# Patient Record
Sex: Male | Born: 1940 | Race: Black or African American | Hispanic: No | Marital: Married | Smoking: Former smoker
Health system: Southern US, Community
[De-identification: ages and names within clinical notes are randomized; demographics above are authoritative.]

## PROBLEM LIST (undated history)

## (undated) DIAGNOSIS — Z860100 Personal history of colon polyps, unspecified: Secondary | ICD-10-CM

## (undated) DIAGNOSIS — K5730 Diverticulosis of large intestine without perforation or abscess without bleeding: Secondary | ICD-10-CM

## (undated) DIAGNOSIS — Z7901 Long term (current) use of anticoagulants: Secondary | ICD-10-CM

## (undated) DIAGNOSIS — M1990 Unspecified osteoarthritis, unspecified site: Secondary | ICD-10-CM

## (undated) DIAGNOSIS — M722 Plantar fascial fibromatosis: Secondary | ICD-10-CM

## (undated) DIAGNOSIS — Z86718 Personal history of other venous thrombosis and embolism: Secondary | ICD-10-CM

## (undated) DIAGNOSIS — I2510 Atherosclerotic heart disease of native coronary artery without angina pectoris: Secondary | ICD-10-CM

## (undated) DIAGNOSIS — Z86711 Personal history of pulmonary embolism: Secondary | ICD-10-CM

## (undated) DIAGNOSIS — M5136 Other intervertebral disc degeneration, lumbar region: Secondary | ICD-10-CM

## (undated) DIAGNOSIS — I700 Atherosclerosis of aorta: Secondary | ICD-10-CM

## (undated) DIAGNOSIS — J479 Bronchiectasis, uncomplicated: Secondary | ICD-10-CM

## (undated) DIAGNOSIS — R7302 Impaired glucose tolerance (oral): Secondary | ICD-10-CM

## (undated) DIAGNOSIS — G4730 Sleep apnea, unspecified: Secondary | ICD-10-CM

## (undated) DIAGNOSIS — N400 Enlarged prostate without lower urinary tract symptoms: Secondary | ICD-10-CM

## (undated) DIAGNOSIS — F419 Anxiety disorder, unspecified: Secondary | ICD-10-CM

## (undated) DIAGNOSIS — C61 Malignant neoplasm of prostate: Secondary | ICD-10-CM

## (undated) DIAGNOSIS — E7800 Pure hypercholesterolemia, unspecified: Secondary | ICD-10-CM

## (undated) DIAGNOSIS — I10 Essential (primary) hypertension: Secondary | ICD-10-CM

## (undated) HISTORY — DX: Diverticulosis of large intestine without perforation or abscess without bleeding: K57.30

## (undated) HISTORY — DX: Other intervertebral disc degeneration, lumbar region: M51.36

## (undated) HISTORY — DX: Plantar fascial fibromatosis: M72.2

## (undated) HISTORY — DX: Malignant neoplasm of prostate: C61

## (undated) HISTORY — DX: Pure hypercholesterolemia, unspecified: E78.00

## (undated) HISTORY — DX: Essential (primary) hypertension: I10

## (undated) HISTORY — PX: UVULECTOMY: SHX2631

## (undated) HISTORY — DX: Sleep apnea, unspecified: G47.30

## (undated) HISTORY — DX: Personal history of colon polyps, unspecified: Z86.0100

## (undated) HISTORY — DX: Atherosclerosis of aorta: I70.0

## (undated) HISTORY — PX: INSERTION PROSTATE RADIATION SEED: SUR718

## (undated) HISTORY — DX: Benign prostatic hyperplasia without lower urinary tract symptoms: N40.0

## (undated) HISTORY — DX: Personal history of other venous thrombosis and embolism: Z86.718

## (undated) HISTORY — DX: Bronchiectasis, uncomplicated: J47.9

## (undated) HISTORY — DX: Long term (current) use of anticoagulants: Z79.01

## (undated) HISTORY — DX: Unspecified osteoarthritis, unspecified site: M19.90

## (undated) HISTORY — DX: Atherosclerotic heart disease of native coronary artery without angina pectoris: I25.10

## (undated) HISTORY — DX: Anxiety disorder, unspecified: F41.9

## (undated) HISTORY — DX: Personal history of colonic polyps: Z86.010

## (undated) HISTORY — DX: Impaired glucose tolerance (oral): R73.02

---

## 2005-01-17 ENCOUNTER — Ambulatory Visit: Payer: Self-pay | Admitting: Internal Medicine

## 2005-07-18 ENCOUNTER — Ambulatory Visit: Payer: Self-pay | Admitting: Internal Medicine

## 2006-07-08 ENCOUNTER — Ambulatory Visit: Payer: Self-pay | Admitting: Internal Medicine

## 2006-07-08 LAB — CONVERTED CEMR LAB
Albumin: 4 g/dL (ref 3.5–5.2)
Alkaline Phosphatase: 76 units/L (ref 39–117)
BUN: 6 mg/dL (ref 6–23)
Bacteria, UA: NEGATIVE
Bilirubin Urine: NEGATIVE
Cholesterol: 156 mg/dL (ref 0–200)
Eosinophils Relative: 3.2 % (ref 0.0–5.0)
GFR calc Af Amer: 96 mL/min
HCT: 53.1 % — ABNORMAL HIGH (ref 39.0–52.0)
Hemoglobin: 17 g/dL (ref 13.0–17.0)
Ketones, ur: NEGATIVE mg/dL
LDL Cholesterol: 105 mg/dL — ABNORMAL HIGH (ref 0–99)
MCV: 87.2 fL (ref 78.0–100.0)
Monocytes Absolute: 0.6 10*3/uL (ref 0.2–0.7)
Mucus, UA: NEGATIVE
Neutrophils Relative %: 40.6 % — ABNORMAL LOW (ref 43.0–77.0)
Nitrite: NEGATIVE
PSA: 3.95 ng/mL (ref 0.10–4.00)
Potassium: 4.1 meq/L (ref 3.5–5.1)
RBC: 6.09 M/uL — ABNORMAL HIGH (ref 4.22–5.81)
RDW: 12.3 % (ref 11.5–14.6)
Sodium: 140 meq/L (ref 135–145)
Specific Gravity, Urine: 1.015 (ref 1.000–1.03)
TSH: 0.88 microintl units/mL (ref 0.35–5.50)
Total CHOL/HDL Ratio: 5
Total Protein, Urine: NEGATIVE mg/dL
Total Protein: 7.5 g/dL (ref 6.0–8.3)
Urine Glucose: NEGATIVE mg/dL
WBC: 6.9 10*3/uL (ref 4.5–10.5)

## 2006-07-30 ENCOUNTER — Encounter: Payer: Self-pay | Admitting: Internal Medicine

## 2006-07-30 ENCOUNTER — Ambulatory Visit: Payer: Self-pay

## 2006-09-04 ENCOUNTER — Ambulatory Visit: Payer: Self-pay | Admitting: Internal Medicine

## 2006-09-17 ENCOUNTER — Ambulatory Visit: Payer: Self-pay | Admitting: Internal Medicine

## 2006-09-17 ENCOUNTER — Encounter: Payer: Self-pay | Admitting: Internal Medicine

## 2006-09-17 LAB — HM COLONOSCOPY

## 2006-11-20 ENCOUNTER — Encounter: Payer: Self-pay | Admitting: *Deleted

## 2006-11-20 DIAGNOSIS — Z87898 Personal history of other specified conditions: Secondary | ICD-10-CM | POA: Insufficient documentation

## 2006-11-20 DIAGNOSIS — D126 Benign neoplasm of colon, unspecified: Secondary | ICD-10-CM | POA: Insufficient documentation

## 2006-11-20 DIAGNOSIS — R609 Edema, unspecified: Secondary | ICD-10-CM | POA: Insufficient documentation

## 2006-11-20 DIAGNOSIS — I82409 Acute embolism and thrombosis of unspecified deep veins of unspecified lower extremity: Secondary | ICD-10-CM | POA: Insufficient documentation

## 2006-11-20 DIAGNOSIS — E7800 Pure hypercholesterolemia, unspecified: Secondary | ICD-10-CM

## 2007-10-21 ENCOUNTER — Ambulatory Visit: Payer: Self-pay | Admitting: Internal Medicine

## 2007-10-21 DIAGNOSIS — M79609 Pain in unspecified limb: Secondary | ICD-10-CM | POA: Insufficient documentation

## 2007-10-21 LAB — CONVERTED CEMR LAB
Basophils Absolute: 0 10*3/uL (ref 0.0–0.1)
Basophils Relative: 0.4 % (ref 0.0–3.0)
Eosinophils Absolute: 0.2 10*3/uL (ref 0.0–0.7)
Lymphocytes Relative: 45.1 % (ref 12.0–46.0)
MCHC: 33.1 g/dL (ref 30.0–36.0)
Neutrophils Relative %: 41.9 % — ABNORMAL LOW (ref 43.0–77.0)
Platelets: 258 10*3/uL (ref 150–400)
Prothrombin Time: 12.2 s (ref 10.9–13.3)
RBC: 5.88 M/uL — ABNORMAL HIGH (ref 4.22–5.81)
WBC: 7.8 10*3/uL (ref 4.5–10.5)

## 2007-10-22 ENCOUNTER — Encounter: Payer: Self-pay | Admitting: Internal Medicine

## 2007-10-22 ENCOUNTER — Ambulatory Visit: Payer: Self-pay

## 2007-10-22 DIAGNOSIS — K5730 Diverticulosis of large intestine without perforation or abscess without bleeding: Secondary | ICD-10-CM | POA: Insufficient documentation

## 2007-10-22 DIAGNOSIS — I10 Essential (primary) hypertension: Secondary | ICD-10-CM

## 2007-11-05 ENCOUNTER — Encounter: Payer: Self-pay | Admitting: Internal Medicine

## 2007-11-05 ENCOUNTER — Telehealth: Payer: Self-pay | Admitting: Internal Medicine

## 2007-11-05 ENCOUNTER — Ambulatory Visit: Payer: Self-pay | Admitting: Internal Medicine

## 2007-11-05 DIAGNOSIS — R300 Dysuria: Secondary | ICD-10-CM | POA: Insufficient documentation

## 2007-11-05 DIAGNOSIS — R5381 Other malaise: Secondary | ICD-10-CM | POA: Insufficient documentation

## 2007-11-05 DIAGNOSIS — R5383 Other fatigue: Secondary | ICD-10-CM

## 2007-11-06 ENCOUNTER — Encounter (INDEPENDENT_AMBULATORY_CARE_PROVIDER_SITE_OTHER): Payer: Self-pay | Admitting: *Deleted

## 2007-11-06 LAB — CONVERTED CEMR LAB
ALT: 25 units/L (ref 0–53)
Albumin: 3.7 g/dL (ref 3.5–5.2)
BUN: 11 mg/dL (ref 6–23)
Bilirubin, Direct: 0.1 mg/dL (ref 0.0–0.3)
CO2: 29 meq/L (ref 19–32)
Calcium: 8.9 mg/dL (ref 8.4–10.5)
Creatinine, Ser: 1 mg/dL (ref 0.4–1.5)
GFR calc Af Amer: 96 mL/min
Glucose, Bld: 116 mg/dL — ABNORMAL HIGH (ref 70–99)
HDL: 35.9 mg/dL — ABNORMAL LOW (ref >39.0)
PSA: 4.28 ng/mL — ABNORMAL HIGH (ref 0.10–4.00)
Sodium: 141 meq/L (ref 135–145)
TSH: 0.7 microintl units/mL (ref 0.35–5.50)
Total Protein: 6.9 g/dL (ref 6.0–8.3)
Triglycerides: 71 mg/dL (ref 0–149)
VLDL: 14 mg/dL (ref 0–40)

## 2007-11-11 ENCOUNTER — Telehealth (INDEPENDENT_AMBULATORY_CARE_PROVIDER_SITE_OTHER): Payer: Self-pay | Admitting: *Deleted

## 2007-11-18 ENCOUNTER — Encounter: Payer: Self-pay | Admitting: Internal Medicine

## 2007-12-18 ENCOUNTER — Encounter: Payer: Self-pay | Admitting: Internal Medicine

## 2007-12-24 ENCOUNTER — Encounter: Payer: Self-pay | Admitting: Internal Medicine

## 2008-01-07 ENCOUNTER — Ambulatory Visit: Admission: RE | Admit: 2008-01-07 | Discharge: 2008-04-06 | Payer: Self-pay | Admitting: Radiation Oncology

## 2008-01-08 ENCOUNTER — Encounter: Payer: Self-pay | Admitting: Internal Medicine

## 2008-02-12 ENCOUNTER — Ambulatory Visit (HOSPITAL_BASED_OUTPATIENT_CLINIC_OR_DEPARTMENT_OTHER): Admission: RE | Admit: 2008-02-12 | Discharge: 2008-02-12 | Payer: Self-pay | Admitting: Urology

## 2008-02-12 ENCOUNTER — Encounter: Payer: Self-pay | Admitting: Internal Medicine

## 2008-02-24 ENCOUNTER — Encounter: Payer: Self-pay | Admitting: Internal Medicine

## 2008-03-04 ENCOUNTER — Encounter: Payer: Self-pay | Admitting: Internal Medicine

## 2008-03-19 ENCOUNTER — Encounter: Payer: Self-pay | Admitting: Internal Medicine

## 2008-03-24 ENCOUNTER — Ambulatory Visit: Payer: Self-pay | Admitting: Internal Medicine

## 2008-03-24 DIAGNOSIS — Z8546 Personal history of malignant neoplasm of prostate: Secondary | ICD-10-CM

## 2008-03-24 DIAGNOSIS — M722 Plantar fascial fibromatosis: Secondary | ICD-10-CM | POA: Insufficient documentation

## 2008-03-24 DIAGNOSIS — K648 Other hemorrhoids: Secondary | ICD-10-CM | POA: Insufficient documentation

## 2008-05-04 ENCOUNTER — Telehealth: Payer: Self-pay | Admitting: Internal Medicine

## 2008-05-04 ENCOUNTER — Telehealth (INDEPENDENT_AMBULATORY_CARE_PROVIDER_SITE_OTHER): Payer: Self-pay | Admitting: *Deleted

## 2008-05-04 ENCOUNTER — Ambulatory Visit: Payer: Self-pay | Admitting: Internal Medicine

## 2008-05-04 DIAGNOSIS — E739 Lactose intolerance, unspecified: Secondary | ICD-10-CM | POA: Insufficient documentation

## 2008-05-04 DIAGNOSIS — J069 Acute upper respiratory infection, unspecified: Secondary | ICD-10-CM | POA: Insufficient documentation

## 2008-06-22 ENCOUNTER — Encounter: Payer: Self-pay | Admitting: Internal Medicine

## 2008-09-22 ENCOUNTER — Encounter: Payer: Self-pay | Admitting: Internal Medicine

## 2008-10-27 ENCOUNTER — Ambulatory Visit: Payer: Self-pay | Admitting: Internal Medicine

## 2008-10-28 ENCOUNTER — Ambulatory Visit: Payer: Self-pay

## 2008-10-28 ENCOUNTER — Encounter: Payer: Self-pay | Admitting: Internal Medicine

## 2008-11-03 ENCOUNTER — Ambulatory Visit: Payer: Self-pay | Admitting: Internal Medicine

## 2008-11-03 LAB — CONVERTED CEMR LAB
ALT: 31 units/L (ref 0–53)
AST: 26 units/L (ref 0–37)
Albumin: 3.9 g/dL (ref 3.5–5.2)
BUN: 15 mg/dL (ref 6–23)
Basophils Absolute: 0 10*3/uL (ref 0.0–0.1)
CO2: 30 meq/L (ref 19–32)
Chloride: 105 meq/L (ref 96–112)
Cholesterol: 156 mg/dL (ref 0–200)
Eosinophils Relative: 2.3 % (ref 0.0–5.0)
GFR calc non Af Amer: 85.37 mL/min (ref >60)
Glucose, Bld: 125 mg/dL — ABNORMAL HIGH (ref 70–99)
HCT: 51 % (ref 39.0–52.0)
Hemoglobin, Urine: NEGATIVE
Hemoglobin: 16.9 g/dL (ref 13.0–17.0)
Ketones, ur: NEGATIVE mg/dL
Leukocytes, UA: NEGATIVE
Lymphs Abs: 2.4 10*3/uL (ref 0.7–4.0)
MCV: 88.9 fL (ref 78.0–100.0)
Monocytes Absolute: 0.4 10*3/uL (ref 0.1–1.0)
Monocytes Relative: 6.9 % (ref 3.0–12.0)
Neutro Abs: 3.6 10*3/uL (ref 1.4–7.7)
PSA: 0.54 ng/mL (ref 0.10–4.00)
Platelets: 273 10*3/uL (ref 150.0–400.0)
Potassium: 4.4 meq/L (ref 3.5–5.1)
RDW: 12.5 % (ref 11.5–14.6)
Sodium: 140 meq/L (ref 135–145)
TSH: 0.74 microintl units/mL (ref 0.35–5.50)
Urine Glucose: NEGATIVE mg/dL
Urobilinogen, UA: 0.2 (ref 0.0–1.0)
VLDL: 13.8 mg/dL (ref 0.0–40.0)

## 2008-11-09 ENCOUNTER — Ambulatory Visit: Payer: Self-pay | Admitting: Internal Medicine

## 2008-11-24 ENCOUNTER — Encounter: Payer: Self-pay | Admitting: Internal Medicine

## 2008-12-20 ENCOUNTER — Encounter: Payer: Self-pay | Admitting: Internal Medicine

## 2009-01-17 ENCOUNTER — Ambulatory Visit: Payer: Self-pay | Admitting: Internal Medicine

## 2009-01-17 DIAGNOSIS — R0789 Other chest pain: Secondary | ICD-10-CM | POA: Insufficient documentation

## 2009-03-15 ENCOUNTER — Ambulatory Visit: Payer: Self-pay | Admitting: Internal Medicine

## 2009-03-31 ENCOUNTER — Encounter: Payer: Self-pay | Admitting: Internal Medicine

## 2009-05-17 ENCOUNTER — Encounter: Payer: Self-pay | Admitting: Internal Medicine

## 2009-06-22 ENCOUNTER — Encounter: Payer: Self-pay | Admitting: Internal Medicine

## 2009-10-27 ENCOUNTER — Ambulatory Visit: Payer: Self-pay | Admitting: Internal Medicine

## 2009-10-27 LAB — CONVERTED CEMR LAB
Albumin: 3.8 g/dL (ref 3.5–5.2)
Alkaline Phosphatase: 82 units/L (ref 39–117)
Basophils Absolute: 0 10*3/uL (ref 0.0–0.1)
Bilirubin, Direct: 0.2 mg/dL (ref 0.0–0.3)
Calcium: 9.9 mg/dL (ref 8.4–10.5)
Cholesterol: 165 mg/dL (ref 0–200)
Eosinophils Absolute: 0.2 10*3/uL (ref 0.0–0.7)
GFR calc non Af Amer: 76.99 mL/min (ref >60)
Glucose, Bld: 112 mg/dL — ABNORMAL HIGH (ref 70–99)
HCT: 49.9 % (ref 39.0–52.0)
Ketones, ur: NEGATIVE mg/dL
LDL Cholesterol: 105 mg/dL — ABNORMAL HIGH (ref 0–99)
Leukocytes, UA: NEGATIVE
Lymphs Abs: 2.7 10*3/uL (ref 0.7–4.0)
MCHC: 33.1 g/dL (ref 30.0–36.0)
MCV: 88.3 fL (ref 78.0–100.0)
Monocytes Absolute: 0.5 10*3/uL (ref 0.1–1.0)
Nitrite: NEGATIVE
Platelets: 283 10*3/uL (ref 150.0–400.0)
RDW: 13.7 % (ref 11.5–14.6)
Sodium: 139 meq/L (ref 135–145)
Specific Gravity, Urine: 1.02 (ref 1.000–1.030)
Total Bilirubin: 0.9 mg/dL (ref 0.3–1.2)
Triglycerides: 108 mg/dL (ref 0.0–149.0)
pH: 6.5 (ref 5.0–8.0)

## 2009-10-29 ENCOUNTER — Emergency Department (HOSPITAL_BASED_OUTPATIENT_CLINIC_OR_DEPARTMENT_OTHER): Admission: EM | Admit: 2009-10-29 | Discharge: 2009-10-29 | Payer: Self-pay | Admitting: Emergency Medicine

## 2009-10-29 ENCOUNTER — Ambulatory Visit: Payer: Self-pay | Admitting: Diagnostic Radiology

## 2009-11-01 ENCOUNTER — Ambulatory Visit: Payer: Self-pay | Admitting: Internal Medicine

## 2009-11-01 ENCOUNTER — Encounter: Payer: Self-pay | Admitting: Internal Medicine

## 2009-11-01 ENCOUNTER — Telehealth (INDEPENDENT_AMBULATORY_CARE_PROVIDER_SITE_OTHER): Payer: Self-pay | Admitting: *Deleted

## 2009-11-01 DIAGNOSIS — R252 Cramp and spasm: Secondary | ICD-10-CM | POA: Insufficient documentation

## 2009-11-01 DIAGNOSIS — F411 Generalized anxiety disorder: Secondary | ICD-10-CM | POA: Insufficient documentation

## 2009-11-07 ENCOUNTER — Encounter: Payer: Self-pay | Admitting: Internal Medicine

## 2009-11-08 ENCOUNTER — Ambulatory Visit: Payer: Self-pay

## 2009-11-08 ENCOUNTER — Encounter: Payer: Self-pay | Admitting: Internal Medicine

## 2009-11-24 ENCOUNTER — Telehealth: Payer: Self-pay | Admitting: Internal Medicine

## 2009-11-28 ENCOUNTER — Ambulatory Visit: Payer: Self-pay | Admitting: Internal Medicine

## 2009-11-28 DIAGNOSIS — M25579 Pain in unspecified ankle and joints of unspecified foot: Secondary | ICD-10-CM | POA: Insufficient documentation

## 2009-12-06 ENCOUNTER — Encounter: Payer: Self-pay | Admitting: Internal Medicine

## 2009-12-21 ENCOUNTER — Encounter: Payer: Self-pay | Admitting: Internal Medicine

## 2010-02-10 ENCOUNTER — Emergency Department (HOSPITAL_COMMUNITY)
Admission: EM | Admit: 2010-02-10 | Discharge: 2010-02-10 | Payer: Self-pay | Source: Home / Self Care | Admitting: Emergency Medicine

## 2010-02-13 ENCOUNTER — Encounter: Payer: Self-pay | Admitting: Internal Medicine

## 2010-02-13 ENCOUNTER — Ambulatory Visit: Payer: Self-pay | Admitting: Internal Medicine

## 2010-02-13 DIAGNOSIS — I959 Hypotension, unspecified: Secondary | ICD-10-CM

## 2010-02-13 LAB — CONVERTED CEMR LAB
Bilirubin Urine: NEGATIVE
Ketones, ur: NEGATIVE mg/dL
Total Protein, Urine: NEGATIVE mg/dL
Urine Glucose: 250 mg/dL
pH: 6 (ref 5.0–8.0)

## 2010-02-15 ENCOUNTER — Ambulatory Visit: Payer: Self-pay

## 2010-02-15 ENCOUNTER — Encounter: Payer: Self-pay | Admitting: Internal Medicine

## 2010-02-15 ENCOUNTER — Ambulatory Visit (HOSPITAL_COMMUNITY)
Admission: RE | Admit: 2010-02-15 | Discharge: 2010-02-15 | Payer: Self-pay | Source: Home / Self Care | Attending: Internal Medicine | Admitting: Internal Medicine

## 2010-02-16 ENCOUNTER — Telehealth: Payer: Self-pay | Admitting: Internal Medicine

## 2010-03-06 ENCOUNTER — Encounter: Payer: Self-pay | Admitting: Internal Medicine

## 2010-03-06 ENCOUNTER — Other Ambulatory Visit: Payer: Self-pay | Admitting: Internal Medicine

## 2010-03-06 ENCOUNTER — Ambulatory Visit
Admission: RE | Admit: 2010-03-06 | Discharge: 2010-03-06 | Payer: Self-pay | Source: Home / Self Care | Attending: Internal Medicine | Admitting: Internal Medicine

## 2010-03-06 DIAGNOSIS — Z86711 Personal history of pulmonary embolism: Secondary | ICD-10-CM | POA: Insufficient documentation

## 2010-03-06 LAB — BASIC METABOLIC PANEL
BUN: 12 mg/dL (ref 6–23)
CO2: 28 mEq/L (ref 19–32)
Calcium: 9.9 mg/dL (ref 8.4–10.5)
Chloride: 105 mEq/L (ref 96–112)
Creatinine, Ser: 0.9 mg/dL (ref 0.4–1.5)
GFR: 108.59 mL/min (ref >60.00)
Glucose, Bld: 69 mg/dL — ABNORMAL LOW (ref 70–99)
Potassium: 4.8 mEq/L (ref 3.5–5.1)
Sodium: 140 mEq/L (ref 135–145)

## 2010-03-09 ENCOUNTER — Telehealth: Payer: Self-pay | Admitting: Internal Medicine

## 2010-03-09 ENCOUNTER — Encounter: Payer: Self-pay | Admitting: Internal Medicine

## 2010-03-09 ENCOUNTER — Telehealth (INDEPENDENT_AMBULATORY_CARE_PROVIDER_SITE_OTHER): Payer: Self-pay | Admitting: *Deleted

## 2010-03-09 ENCOUNTER — Ambulatory Visit
Admission: RE | Admit: 2010-03-09 | Discharge: 2010-03-09 | Payer: Self-pay | Source: Home / Self Care | Attending: Internal Medicine | Admitting: Internal Medicine

## 2010-03-09 ENCOUNTER — Ambulatory Visit: Payer: Self-pay | Admitting: Cardiovascular Disease

## 2010-03-09 DIAGNOSIS — I2699 Other pulmonary embolism without acute cor pulmonale: Secondary | ICD-10-CM | POA: Insufficient documentation

## 2010-03-09 DIAGNOSIS — D6859 Other primary thrombophilia: Secondary | ICD-10-CM | POA: Insufficient documentation

## 2010-03-09 LAB — CONVERTED CEMR LAB

## 2010-03-10 ENCOUNTER — Encounter: Payer: Self-pay | Admitting: Internal Medicine

## 2010-03-13 ENCOUNTER — Ambulatory Visit: Admission: RE | Admit: 2010-03-13 | Discharge: 2010-03-13 | Payer: Self-pay | Source: Home / Self Care

## 2010-03-13 ENCOUNTER — Ambulatory Visit (HOSPITAL_COMMUNITY)
Admission: RE | Admit: 2010-03-13 | Discharge: 2010-03-13 | Payer: Self-pay | Source: Home / Self Care | Attending: Internal Medicine | Admitting: Internal Medicine

## 2010-03-13 ENCOUNTER — Encounter: Payer: Self-pay | Admitting: Internal Medicine

## 2010-03-13 LAB — CONVERTED CEMR LAB: POC INR: 1.4

## 2010-03-15 ENCOUNTER — Ambulatory Visit: Admission: RE | Admit: 2010-03-15 | Discharge: 2010-03-15 | Payer: Self-pay | Source: Home / Self Care

## 2010-03-15 ENCOUNTER — Encounter: Payer: Self-pay | Admitting: Internal Medicine

## 2010-03-16 ENCOUNTER — Ambulatory Visit: Admission: RE | Admit: 2010-03-16 | Discharge: 2010-03-16 | Payer: Self-pay | Source: Home / Self Care

## 2010-03-23 ENCOUNTER — Ambulatory Visit: Admission: RE | Admit: 2010-03-23 | Discharge: 2010-03-23 | Payer: Self-pay | Source: Home / Self Care

## 2010-03-28 NOTE — Consult Note (Signed)
Summary: [facility]  [facility]   Imported By: Sherian Rein 04/05/2009 09:17:32  _____________________________________________________________________  External Attachment:    Type:   Image     Comment:   External Document

## 2010-03-28 NOTE — Consult Note (Signed)
Summary: [facility]  [facility]   Imported By: Sherian Rein 12/09/2009 13:37:07  _____________________________________________________________________  External Attachment:    Type:   Image     Comment:   External Document

## 2010-03-28 NOTE — Assessment & Plan Note (Signed)
Summary: swollen red ankle/cd   Vital Signs:  Patient profile:   70 year old male Height:      69 inches Weight:      202.25 pounds BMI:     29.98 O2 Sat:      96 % on Room air Temp:     98.7 degrees F oral Pulse rate:   84 / minute BP sitting:   118 / 82  (left arm) Cuff size:   regular  Vitals Entered By: Zella Ball Ewing CMA Duncan Dull) (November 28, 2009 11:52 AM)  O2 Flow:  Room air CC: Right ankle swollen and hot, flu shot/RE, Back Pain   CC:  Right ankle swollen and hot, flu shot/RE, and Back Pain.  History of Present Illness: here for acute - c/o mild to mod pain, red, swelling and warmth starting the at the whole right ankle and seemed somewhat to go up the back of the leg to mid calf;  little to no foot sweling;  overall x 3 days, much improved today, though did limp some in the first day.  Similar to episode 2 yrs ago, mild, and did not seem med attention at that time.  Pt denies CP, worsening sob, doe, wheezing, orthopnea, pnd, worsening LE edema, palps, dizziness or syncope  Denies fever, truama, overuse, hx of ankle arthriits or gout in the past.  .Denies polydipsia, polyuria.  Due for flu shot today.  Did not try any OTC meds. Pt denies new neuro symptoms such as headache, facial or extremity weakness No recent wt loss, night sweats, loss of appetite or other constitutional symptoms    Problems Prior to Update: 1)  Ankle Pain, Right  (ICD-719.47) 2)  Anxiety  (ICD-300.00) 3)  Leg Pain, Right  (ICD-729.5) 4)  Leg Cramps  (ICD-729.82) 5)  Glucose Intolerance  (ICD-271.3) 6)  Chest Pain, Right  (ICD-786.50) 7)  Preventive Health Care  (ICD-V70.0) 8)  Leg Pain, Right  (ICD-729.5) 9)  Glucose Intolerance  (ICD-271.3) 10)  Uri  (ICD-465.9) 11)  Plantar Fasciitis  (ICD-728.71) 12)  Prostate Cancer, Hx of  (ICD-V10.46) 13)  Internal Hemorrhoids Without Mention Comp  (ICD-455.0) 14)  Dysuria  (ICD-788.1) 15)  Fatigue  (ICD-780.79) 16)  Diverticulosis, Colon  (ICD-562.10) 17)   Hypertension  (ICD-401.9) 18)  Leg Pain, Right  (ICD-729.5) 19)  Leg Edema  (ICD-782.3) 20)  Benign Prostatic Hypertrophy, Hx of  (ICD-V13.8) 21)  Hypercholesterolemia  (ICD-272.0) 22)  Colonic Polyps  (ICD-211.3) 23)  Dvt  (ICD-453.40)  Medications Prior to Update: 1)  Aspirin 81 Mg  Tbec (Aspirin) .... One By Mouth Every Day 2)  Pneumovax 23 25 Mcg/0.12ml Inj (Pneumococcal Vac Polyvalent) .... Use As Directed  Current Medications (verified): 1)  Aspirin 81 Mg  Tbec (Aspirin) .... One By Mouth Every Day 2)  Pneumovax 23 25 Mcg/0.31ml Inj (Pneumococcal Vac Polyvalent) .... Use As Directed 3)  Prednisone 10 Mg Tabs (Prednisone) .... 3po Qd For 3days, Then 2po Qd For 3days, Then 1po Qd For 3days, Then Stop 4)  Indomethacin 50 Mg Caps (Indomethacin) .Marland Kitchen.. 1po Three Times A Day As Needed Gout Attack  Allergies (verified): No Known Drug Allergies  Past History:  Past Medical History: Last updated: 11/01/2009 hx of dvt 2004 colon polyps, hx of BPH hypercholesterolemia Hypertension Diverticulosis, colon left foot DJD Prostate cancer, hx of plantar fasciitis glucose intolerance Anxiety  Past Surgical History: Last updated: 11/01/2009 Uvulectomy for snoring, in his 20's s/p prestate seeds for prostate  ca dec 2009  Social History: Last updated: 11/01/2009 Current Smoker Alcohol use-yes 2 children retired - [employer] Drug use-no Married  Risk Factors: Smoking Status: current (10/21/2007)  Review of Systems       all otherwise negative per pt -    Physical Exam  General:  alert and overweight-appearing.   Head:  normocephalic and atraumatic.   Eyes:  vision grossly intact, pupils equal, and pupils round.   Ears:  R ear normal and L ear normal.   Nose:  no external deformity and no nasal discharge.   Mouth:  no gingival abnormalities and pharynx pink and moist.   Neck:  supple and no masses.   Lungs:  normal respiratory effort and  normal breath sounds.   Heart:  normal rate and regular rhythm.   Msk:  mild right ankle effusion and warmth, no apprecialbe redness and only mild tender at this point;  adjacent leg and foot without red, swelling, tender, swelling and neg homans Extremities:  no edema, no erythema  Neurologic:  right foot o/w neurovasc intact   Impression & Recommendations:  Problem # 1:  ANKLE PAIN, RIGHT (ICD-719.47) with effusion much improved in short period of time - most c/w mild gout attack, similar to episode 2 yrs ago he only reported today; for pred pack now, and indocin as needed in the future;  declines uric acid labs today;  if recurs will need this, and consider allopurinol daily  Problem # 2:  HYPERTENSION (ICD-401.9)  BP today: 118/82 Prior BP: 122/80 (11/01/2009)  Labs Reviewed: K+: 5.9 (10/27/2009) Creat: : 1.2 (10/27/2009)   Chol: 165 (10/27/2009)   HDL: 38.70 (10/27/2009)   LDL: 105 (10/27/2009)   TG: 108.0 (10/27/2009) stable overall by hx and exam, ok to continue meds/tx as is - no meds needed at this time  Complete Medication List: 1)  Aspirin 81 Mg Tbec (Aspirin) .... One by mouth every day 2)  Pneumovax 23 25 Mcg/0.60ml Inj (Pneumococcal vac polyvalent) .... Use as directed 3)  Prednisone 10 Mg Tabs (Prednisone) .... 3po qd for 3days, then 2po qd for 3days, then 1po qd for 3days, then stop 4)  Indomethacin 50 Mg Caps (Indomethacin) .Marland Kitchen.. 1po three times a day as needed gout attack  Other Orders: Flu Vaccine 53yrs + MEDICARE PATIENTS (Z6109) Administration Flu vaccine - MCR (U0454)  Patient Instructions: 1)  Please take the prednisone now 2)  You can take the indomethacin in the future for any recurrent gout like attacks 3)  Continue all previous medications as before this visit 4)  Please schedule a follow-up appointment as needed. Prescriptions: INDOMETHACIN 50 MG CAPS (INDOMETHACIN) 1po three times a day as needed gout attack  #90 x 1   Entered and Authorized by:    Corwin Levins MD   Signed by:   Corwin Levins MD on 11/28/2009   Method used:   Print then Give to Patient   RxID:   [id number] PREDNISONE 10 MG TABS (PREDNISONE) 3po qd for 3days, then 2po qd for 3days, then 1po qd for 3days, then stop  #18 x 0   Entered and Authorized by:   Corwin Levins MD   Signed by:   Corwin Levins MD on 11/28/2009   Method used:   Print then Give to Patient   RxID:   [id number]      Flu Vaccine Consent Questions     Do you have a history of severe allergic reactions to this vaccine? no  Any prior history of allergic reactions to egg and/or gelatin? no    Do you have a sensitivity to the preservative Thimersol? no    Do you have a past history of Guillan-Barre Syndrome? no    Do you currently have an acute febrile illness? no    Have you ever had a severe reaction to latex? no    Vaccine information given and explained to patient? yes    Are you currently pregnant? no    Lot Number:[id number]   Exp Date:08/26/2010   Site Given  Left Deltoid IMlu

## 2010-03-28 NOTE — Assessment & Plan Note (Signed)
Summary: CPX-LB   Vital Signs:  Patient profile:   70 year old male Height:      69 inches Weight:      204 pounds BMI:     30.23 O2 Sat:      96 % on Room air Temp:     97.8 degrees F oral Pulse rate:   69 / minute BP sitting:   122 / 80  (left arm) Cuff size:   regular  Vitals Entered By: Zella Ball Ewing CMA Duncan Dull) (November 01, 2009 8:41 AM)  O2 Flow:  Room air  Preventive Care Screening     declines flu shit today  CC: Adult Physical/RE   CC:  Adult Physical/RE.  History of Present Illness: here after being seen in the ER last sat;  he thinks he got overheated and becomre presyncopal;  no frank syncope;  had ecg , monitor, VS and CXR done (but no lab work done as pt declined since he just had labs here and felt "fine" by the time labs were to be done);  echart showed cxr neg for acute.  Pt denies CP, worsening sob, doe, wheezing, orthopnea, pnd, worsening LE edema, palps, dizziness or syncope  Pt denies new neuro symptoms such as headache, facial or extremity weakness  denies polydipsia or polyuria.   Taking the ASA but no rx meds.  Gained 3 lbs since jan 2011, but trying to stay active with yard work.  Trying to follow a lower chol diet but has some episodes of non compliance.   Has some fatigue but no OSA symptoms   Has some cramps adn pain to the calves, osmetimes with walking.    Preventive Screening-Counseling & Management      Drug Use:  no.    Problems Prior to Update: 1)  Chest Pain, Right  (ICD-786.50) 2)  Preventive Health Care  (ICD-V70.0) 3)  Leg Pain, Right  (ICD-729.5) 4)  Glucose Intolerance  (ICD-271.3) 5)  Uri  (ICD-465.9) 6)  Plantar Fasciitis  (ICD-728.71) 7)  Prostate Cancer, Hx of  (ICD-V10.46) 8)  Internal Hemorrhoids Without Mention Comp  (ICD-455.0) 9)  Dysuria  (ICD-788.1) 10)  Fatigue  (ICD-780.79) 11)  Diverticulosis, Colon  (ICD-562.10) 12)  Hypertension  (ICD-401.9) 13)  Leg Pain, Right  (ICD-729.5) 14)  Leg Edema  (ICD-782.3) 15)   Benign Prostatic Hypertrophy, Hx of  (ICD-V13.8) 16)  Hypercholesterolemia  (ICD-272.0) 17)  Colonic Polyps  (ICD-211.3) 18)  Dvt  (ICD-453.40)  Medications Prior to Update: 1)  Aspirin 81 Mg  Tbec (Aspirin) .... One By Mouth Every Day 2)  Ceftin 250 Mg Tabs (Cefuroxime Axetil) .Marland Kitchen.. 1 By Mouth Two Times A Day 3)  Hydrocodone-Homatropine 5-1.5 Mg/67ml Syrp (Hydrocodone-Homatropine) .Marland Kitchen.. 1 Tsp By Mouth Q 6 Hrs As Needed Cough  Current Medications (verified): 1)  Aspirin 81 Mg  Tbec (Aspirin) .... One By Mouth Every Day  Allergies (verified): No Known Drug Allergies  Past History:  Family History: Last updated: 10/21/2007 mother with DVT  Social History: Last updated: 11/01/2009 Current Smoker Alcohol use-yes 2 children retired - [employer] Drug use-no Married  Risk Factors: Smoking Status: current (10/21/2007)  Past Medical History: hx of dvt 2004 colon polyps, hx of BPH hypercholesterolemia Hypertension Diverticulosis, colon left foot DJD Prostate cancer, hx of plantar fasciitis glucose intolerance Anxiety  Past Surgical History: Uvulectomy for snoring, in his 20's s/p prestate seeds for prostate  ca dec 2009  Family History: Reviewed history from 10/21/2007 and no  changes required. mother with DVT  Social History: Reviewed history from 10/21/2007 and no changes required. Current Smoker Alcohol use-yes 2 children retired - [employer] Drug use-no Married Drug Use:  no  Review of Systems  The patient denies anorexia, fever, weight loss, weight gain, vision loss, decreased hearing, hoarseness, chest pain, syncope, dyspnea on exertion, peripheral edema, prolonged cough, headaches, hemoptysis, abdominal pain, melena, hematochezia, severe indigestion/heartburn, hematuria, muscle weakness, suspicious skin lesions, transient blindness, difficulty walking, depression, unusual weight change,  abnormal bleeding, enlarged lymph nodes, and angioedema.         all otherwise negative per pt -  except is appealing for a service connected disabiloity through the [place] for PTSD - was denies, and now under appeal  Physical Exam  General:  alert and overweight-appearing.   Head:  normocephalic and atraumatic.   Eyes:  vision grossly intact, pupils equal, and pupils round.   Ears:  R ear normal and L ear normal.   Nose:  no external deformity and no nasal discharge.   Mouth:  no gingival abnormalities and pharynx pink and moist.   Neck:  supple and no masses.   Lungs:  normal respiratory effort and normal breath sounds.   Heart:  normal rate and regular rhythm.   Abdomen:  soft, non-tender, and normal bowel sounds.   Msk:  no joint tenderness and no joint swelling.   Extremities:  no edema, no erythema  Neurologic:  cranial nerves II-XII intact and strength normal in all extremities.   Skin:  color normal and no rashes.   Psych:  not depressed appearing and moderately anxious.     Impression & Recommendations:  Problem # 1:  Preventive Health Care (ICD-V70.0) Overall doing well, age appropriate education and counseling updated and referral for appropriate preventive services done unless declined, immunizations up to date or declined, diet counseling done if overweight, urged to quit smoking if smokes , most recent labs reviewed and current ordered if appropriate, ecg reviewed or declined (interpretation per ECG scanned in the EMR if done); information regarding Medicare Prevention requirements given if appropriate; speciality referrals updated as appropriate   Problem # 2:  LEG CRAMPS (ICD-729.82)  with pain ? cluadication  - for LE dopplers  Orders: Radiology Referral (Radiology)  Problem # 3:  HYPERTENSION (ICD-401.9)  BP today: 122/80 Prior BP: 120/70 (03/15/2009)  Labs Reviewed: K+: 5.9 (10/27/2009) Creat: : 1.2 (10/27/2009)   Chol: 165 (10/27/2009)   HDL: 38.70  (10/27/2009)   LDL: 105 (10/27/2009)   TG: 108.0 (10/27/2009) stable overall by hx and exam, ok to continue meds/tx as is  - no meds needed  Problem # 4:  HYPERCHOLESTEROLEMIA (ICD-272.0)  Labs Reviewed: SGOT: 23 (10/27/2009)   SGPT: 20 (10/27/2009)   HDL:38.70 (10/27/2009), 38.70 (11/03/2008)  LDL:105 (10/27/2009), 104 (11/03/2008)  Chol:165 (10/27/2009), 156 (11/03/2008)  Trig:108.0 (10/27/2009), 69.0 (11/03/2008) d/w pt - for now goal is LDL < 100 - for better diet  Complete Medication List: 1)  Aspirin 81 Mg Tbec (Aspirin) .... One by mouth every day  Patient Instructions: 1)  You will be contacted about the referral(s) to: Leg artery ultrasound 2)  Continue all previous medications as before this visit, including the aspirin 3)  Please schedule a follow-up appointment in 6 months with: 4)  Lipid Panel prior to visit, ICD-9: 272.0 5)  HbgA1C prior to visit, ICD-9: 790.2 6)  BMET :  790.2

## 2010-03-28 NOTE — Letter (Signed)
[facility]   Imported By: Sherian Rein 06/27/2009 12:26:03  _____________________________________________________________________  External Attachment:    Type:   Image     Comment:   External Document

## 2010-03-28 NOTE — Progress Notes (Signed)
Summary: Vaccination?  Phone Note From Pharmacy   Caller: [facility] Summary of Call: Pharmacy called stating pt is requesting Rx for pnemovax at pharmacy Initial call taken by: Margaret Pyle, CMA,  November 24, 2009 1:30 PM  Follow-up for Phone Call        ok - to robin to handle Follow-up by: Corwin Levins MD,  November 24, 2009 2:06 PM    New/Updated Medications: PNEUMOVAX 23 25 MCG/0.5ML INJ (PNEUMOCOCCAL VAC POLYVALENT) Use as directed Prescriptions: PNEUMOVAX 23 25 MCG/0.5ML INJ (PNEUMOCOCCAL VAC POLYVALENT) Use as directed  #1 x 0   Entered by:   Scharlene Gloss CMA (AAMA)   Authorized by:   Corwin Levins MD   Signed by:   Scharlene Gloss CMA (AAMA) on 11/24/2009   Method used:   Faxed to ...       [facility] (retail)       [phone]       [place], [place]  [address]       Ph: [phone]       Fax: [phone]   RxID:   [id number]

## 2010-03-28 NOTE — Miscellaneous (Signed)
Summary: Orders Update  Clinical Lists Changes  Orders: Added new Test order of Arterial Duplex Lower Extremity (Arterial Duplex Low) - Signed 

## 2010-03-28 NOTE — Consult Note (Signed)
Summary: [facility]  [facility]   Imported By: Lennie Odor 12/27/2009 10:54:56  _____________________________________________________________________  External Attachment:    Type:   Image     Comment:   External Document

## 2010-03-28 NOTE — Progress Notes (Signed)
----   Converted from flag ---- ---- 11/01/2009 10:07 AM, Edman Circle wrote: appt 9/13 @ 3:00  ---- 11/01/2009 9:38 AM, Dagoberto Reef wrote: Thanks  ---- 11/01/2009 9:33 AM, Corwin Levins MD wrote: The following orders have been entered for this patient and placed on Admin Hold:  Type:     Referral       Code:   Radiology Description:   Radiology Referral Order Date:   11/01/2009   Authorized By:   Corwin Levins MD Order #:   [id number] Clinical Notes:   Name of Test or Procedure: LE arterial dopplers   Of What:  Special Instructions ------------------------------

## 2010-03-28 NOTE — Assessment & Plan Note (Signed)
Summary: COUGH  STC   Vital Signs:  Patient profile:   70 year old male Height:      69 inches Weight:      201 pounds BMI:     29.79 O2 Sat:      97 % on Room air Temp:     98.3 degrees F oral Pulse rate:   68 / minute BP sitting:   120 / 70  (left arm) Cuff size:   regular  Vitals Entered ByZella Ball Ewing (March 15, 2009 3:58 PM)  O2 Flow:  Room air CC: cough,chest congestion./RE   CC:  cough and chest congestion./RE.  History of Present Illness: here with 3 wks chest congestion and cough, now worse in the past 3 days with fever, ST, malasie, prod cough greenish d/c, but no wheezing and Pt denies CP, sob, doe, wheezing, orthopnea, pnd, worsening LE edema, palps, dizziness or syncope .  Pt denies new neuro symptoms such as headache, facial or extremity weakness   Pt denies polydipsia, polyuria, or low sugar symptoms such as shakiness improved with eating.  Overall good compliance with meds, trying to follow low chol diet, wt stable, little excercise however   Problems Prior to Update: 1)  Bronchitis-acute  (ICD-466.0) 2)  Chest Pain, Right  (ICD-786.50) 3)  Preventive Health Care  (ICD-V70.0) 4)  Leg Pain, Right  (ICD-729.5) 5)  Glucose Intolerance  (ICD-271.3) 6)  Uri  (ICD-465.9) 7)  Plantar Fasciitis  (ICD-728.71) 8)  Prostate Cancer, Hx of  (ICD-V10.46) 9)  Internal Hemorrhoids Without Mention Comp  (ICD-455.0) 10)  Dysuria  (ICD-788.1) 11)  Fatigue  (ICD-780.79) 12)  Diverticulosis, Colon  (ICD-562.10) 13)  Hypertension  (ICD-401.9) 14)  Leg Pain, Right  (ICD-729.5) 15)  Leg Edema  (ICD-782.3) 16)  Benign Prostatic Hypertrophy, Hx of  (ICD-V13.8) 17)  Hypercholesterolemia  (ICD-272.0) 18)  Colonic Polyps  (ICD-211.3) 19)  Dvt  (ICD-453.40)  Medications Prior to Update: 1)  Aspirin 81 Mg  Tbec (Aspirin) .... One By Mouth Every Day  Current Medications (verified): 1)  Aspirin 81 Mg  Tbec (Aspirin) .... One By Mouth Every Day 2)  Ceftin 250 Mg Tabs (Cefuroxime  Axetil) .Marland Kitchen.. 1 By Mouth Two Times A Day 3)  Hydrocodone-Homatropine 5-1.5 Mg/46ml Syrp (Hydrocodone-Homatropine) .Marland Kitchen.. 1 Tsp By Mouth Q 6 Hrs As Needed Cough  Allergies (verified): No Known Drug Allergies  Past History:  Past Medical History: Last updated: 03/24/2008 hx of dvt 2004 colon polyps, hx of BPH hypercholesterolemia Hypertension Diverticulosis, colon left foot DJD Prostate cancer, hx of plantar fasciitis  Past Surgical History: Last updated: 10/21/2007 Uvulectomy for snoring, in his 20's  Social History: Last updated: 10/21/2007 Current Smoker Alcohol use-yes 2 children retired - [employer]  Risk Factors: Smoking Status: current (10/21/2007)  Review of Systems       all otherwise negative per pt -  Physical Exam  General:  alert and overweight-appearing.   Head:  normocephalic and atraumatic.   Eyes:  vision grossly intact, pupils equal, and pupils round.   Ears:  bilat tm's red, sinus nontender Nose:  no external deformity and no nasal discharge.   Mouth:  no gingival abnormalities and pharynx pink and moist.   Lungs:  normal respiratory effort and normal breath sounds.   Heart:  normal rate and regular rhythm.   Extremities:  no edema, no erythema    Impression & Recommendations:  Problem # 1:  BRONCHITIS-ACUTE (ICD-466.0)  cant r/o pna -  His updated medication list for this problem includes:    Ceftin 250 Mg Tabs (Cefuroxime axetil) .Marland Kitchen... 1 by mouth two times a day    Hydrocodone-homatropine 5-1.5 Mg/46ml Syrp (Hydrocodone-homatropine) .Marland Kitchen... 1 tsp by mouth q 6 hrs as needed cough  Orders: T-2 View CXR, Same Day (71020.5TC)  Problem # 2:  GLUCOSE INTOLERANCE (ICD-271.3) asympt - does not need OHA at this time, pt to followup for cbg > 200 or polys  Problem # 3:  HYPERTENSION (ICD-401.9)  BP today: 120/70 Prior BP: 118/72 (01/17/2009)  Labs Reviewed: K+: 4.4 (11/03/2008) Creat: : 1.1 (11/03/2008)    Chol: 156 (11/03/2008)   HDL: 38.70 (11/03/2008)   LDL: 104 (11/03/2008)   TG: 69.0 (11/03/2008) d/w pt - stable overall by hx and exam, ok to continue meds/tx as is  -  no need for med at this time  Complete Medication List: 1)  Aspirin 81 Mg Tbec (Aspirin) .... One by mouth every day 2)  Ceftin 250 Mg Tabs (Cefuroxime axetil) .Marland Kitchen.. 1 by mouth two times a day 3)  Hydrocodone-homatropine 5-1.5 Mg/47ml Syrp (Hydrocodone-homatropine) .Marland Kitchen.. 1 tsp by mouth q 6 hrs as needed cough  Patient Instructions: 1)  Please take all new medications as prescribed 2)  Continue all previous medications as before this visit  3)  Please go to Radiology in the basement level for your X-Ray today  4)  Please schedule a follow-up appointment as recommended in Sept 2011 with CPX labs Prescriptions: HYDROCODONE-HOMATROPINE 5-1.5 MG/5ML SYRP (HYDROCODONE-HOMATROPINE) 1 tsp by mouth q 6 hrs as needed cough  #6 oz x 1   Entered and Authorized by:   Corwin Levins MD   Signed by:   Corwin Levins MD on 03/15/2009   Method used:   Print then Give to Patient   RxID:   [id number] CEFTIN 250 MG TABS (CEFUROXIME AXETIL) 1 by mouth two times a day  #20 x 0   Entered and Authorized by:   Corwin Levins MD   Signed by:   Corwin Levins MD on 03/15/2009   Method used:   Print then Give to Patient   RxID:   [id number]

## 2010-03-28 NOTE — Consult Note (Signed)
[facility]: [facility]   Imported By: Sherian Rein 05/23/2009 13:47:45  _____________________________________________________________________  External Attachment:    Type:   Image     Comment:   External Document

## 2010-03-30 NOTE — Progress Notes (Signed)
Summary: UTI?  Phone Note Call from Patient Call back at [facility] Phone [phone]   Caller: Patient Summary of Call: Pt called stating that he would like to start Allopurinol for flares. Pt also states that although PT message stated no UTI he is still having significant burning with urination, please advise. [facility] Initial call taken by: Margaret Pyle, CMA,  February 16, 2010 3:44 PM  Follow-up for Phone Call        ok - will add to med and send per emr  should start only AFTER the prednisone rx is done, and the starting dose is the lower one;  if this does not work, we can consider increasing the allopurinol Follow-up by: Corwin Levins MD,  February 16, 2010 4:22 PM  Additional Follow-up for Phone Call Additional follow up Details #1::        Pt advised and has upcoming appt with Urologist Additional Follow-up by: Margaret Pyle, CMA,  February 16, 2010 4:28 PM    New/Updated Medications: ALLOPURINOL 100 MG TABS (ALLOPURINOL) 1po once daily Prescriptions: ALLOPURINOL 100 MG TABS (ALLOPURINOL) 1po once daily  #90 x 3   Entered and Authorized by:   Corwin Levins MD   Signed by:   Corwin Levins MD on 02/16/2010   Method used:   Electronically to        [facility]  [phone]* (retail)       [address]       [place], [place]  [address]       Ph: [phone] or [phone]       Fax: [phone]   RxID:   [id number]

## 2010-03-30 NOTE — Progress Notes (Signed)
Summary: ct report   Phone Note From Other Clinic   Caller: chest xray report Summary of Call: ct angio chest-positive for pulmunary embolis-no other acute abnormalities-copy to be faxed as well Initial call taken by: Glynda Jaeger,  March 09, 2010 10:28 AM

## 2010-03-30 NOTE — Medication Information (Signed)
Summary: rov/sp   Anticoagulant Therapy  Managed by: Weston Brass, PharmD Referring MD: Tito Dine MD: Ladona Ridgel MD, Sharlot Gowda Indication 1: Pulmonary Embolism Lab Used: [facility] [place] Site: [address] INR POC 2.2 INR RANGE 2.0-3.0  Dietary changes: no    Health status changes: no    Bleeding/hemorrhagic complications: no    Recent/future hospitalizations: no    Any changes in medication regimen? no    Recent/future dental: no  Any missed doses?: no       Is patient compliant with meds? yes       Allergies: No Known Drug Allergies  Anticoagulation Management History:      The patient is taking warfarin and comes in today for a routine follow up visit.  Positive risk factors for bleeding include an age of 71 years or older.  The bleeding index is 'intermediate risk'.  Positive CHADS2 values include History of HTN.  Negative CHADS2 values include Age > 71 years old.  His last INR was 1.0 RATIO.  Anticoagulation responsible provider: Ladona Ridgel MD, Sharlot Gowda.  INR POC: 2.2.  Cuvette Lot#: [id number].  Exp: 02/2011.    Anticoagulation Management Assessment/Plan:      The patient's current anticoagulation dose is Warfarin sodium 5 mg tabs: Take 1 tablet daily or as directed by Anticoagulation Clinic.  The target INR is 2.0-3.0.  The next INR is due 03/23/2010.  Anticoagulation instructions were given to patient.  Results were reviewed/authorized by Weston Brass, PharmD.  He was notified by Weston Brass PharmD.         Prior Anticoagulation Instructions: INR 1.4  Coumadin 5 mg tablets - Take 1.5 tablets daily.  Continue Lovenox injections until next office visit.  return to clinic on Thursday.   Current Anticoagulation Instructions: INR 2.2  Stop Lovenox injections.  Decrease Coumadin to 1 1/2 tablets every day except 1 tablet on Monday and Friday.  Recheck INR in 1 week.

## 2010-03-30 NOTE — Assessment & Plan Note (Signed)
Summary: NP6/HYPOTENSION      Allergies Added: NKDA  Visit Type:  Initial Consult  CC:  blood pressure dropping.  History of Present Illness: Patient is a 70 year old who was refered for evaluation of hypotension. The patient was recently in the ER.  The AM he was see he felt OK  Ate usual breakfast of oatmeal.  He was wating for his car to get serviced at about 12:30.  He began to feel weal.  Sat down  Got some H20.  Felt sweaty, warm, presyncopal. EMS called.  BP on EMS arrival was in upper 80s. Went to ER.  Blood work neg.  Sent home The patient had a couple other spells  ONe occurred in Sept while working in yard.  Aother came on at church.  No episodes of syncope.  No associated palpitatons.  NO CP.  Otherwise the patient is active.  Current Medications (verified): 1)  Aspirin 81 Mg  Tbec (Aspirin) .... One By Mouth Every Day 2)  Allopurinol 100 Mg Tabs (Allopurinol) .Marland Kitchen.. 1po Once Daily  Allergies (verified): No Known Drug Allergies  Past History:  Past medical, surgical, family and social histories (including risk factors) reviewed, and no changes noted (except as noted below).  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   colon polyps, hx of BPH hypercholesterolemia Hypertension Diverticulosis, colon left foot DJD Prostate cancer, hx of plantar fasciitis glucose intolerance Anxiety  Past Surgical History: Reviewed history from 11/01/2009 and no changes required. Uvulectomy for snoring, in his 20's s/p prestate seeds for prostate  ca dec 2009  Family History: Reviewed history from 10/21/2007 and no changes required. mother with DVT  Social History: Reviewed history from 11/01/2009 and no changes required. Current Smoker Alcohol use-yes 2 children retired - [employer] Drug use-no Married  Review of Systems       All systems reviewed.  Neg to the above problme except as noted above.  Vital Signs:  Patient profile:    70 year old male Height:      69 inches Weight:      206 pounds Pulse rate:   76 / minute Pulse (ortho):   85 / minute BP sitting:   144 / 90  (right arm) BP standing:   156 / 101  Vitals Entered By: Jacquelin Hawking, CMA (March 06, 2010 12:09 PM)  Serial Vital Signs/Assessments:  Time      Position  BP       Pulse  Resp  Temp     By           Lying LA  139/81   64                    Layne Benton, RN, BSN           Sitting   146/84   70                    Layne Benton, Charity fundraiser, BSN           Standing  156/101  85                    Layne Benton, Charity fundraiser, BSN  Comments: 3 minutes standing BP 198/131 Pulse 87  By: Layne Benton, RN, BSN    Physical Exam  Additional Exam:  Patinet is in NAD HEENT:  Normocephalic, atraumatic. EOMI, PERRLA.  Neck: JVP is normal. No thyromegaly. No  bruits.  Lungs: clear to auscultation. No rales no wheezes.  Heart: Regular rate and rhythm. Normal S1, S2. No S3.   No significant murmurs. PMI not displaced.  Abdomen:  Supple, nontender. Normal bowel sounds. No masses. No hepatomegaly.  Extremities:   Good distal pulses throughout.  Triv edemal R leg. Musculoskeletal :moving all extremities.  Neuro:   alert and oriented x3.    EKG  Procedure date:  03/06/2010  Findings:      NSR.  LVH  Impression & Recommendations:  Problem # 1:  UNSPECIFIED HYPOTENSION (ICD-458.9)  Patient without syncope.  Episodes sound vagal by history.  Of interest is his histry of remote DVT. (I don't think an etiolgy/preciptant was found).  Given this I would recomm CT to r/o PE.  If neg, consider echo to evaluate LV function. He is not orthostatic on exam now.  Actually hypertensive. (prob due to need to urinate) Continue activites as tolerated.  Drink ample fluids.  Problem # 2:  HYPERTENSION (ICD-401.9)  With presyncopal spell I am reluctant to treat for now.  will need to be followed.    Orders: EKG w/ Interpretation (93000) TLB-BMP (Basic Metabolic  Panel-BMET) (80048-METABOL)  Other Orders: CT Scan  (CT Scan)  Patient Instructions: 1)  Non-Cardiac CT scanning, (CAT scanning), is a noninvasive, special x-ray that produces cross-sectional images of the body using x-rays and a computer. CT scans help physicians diagnose and treat medical conditions. For some CT exams, a contrast material is used to enhance visibility in the area of the body being studied. CT scans provide greater clarity and reveal more details than regular x-ray exams.  CT ANGIO of the CHEST

## 2010-03-30 NOTE — Medication Information (Signed)
Summary: rov/sp   Anticoagulant Therapy  Managed by: Weston Brass, PharmD Referring MD: Tito Dine MD: Patty Sermons Indication 1: Pulmonary Embolism Lab Used: [facility] [place] Site: [address] INR POC 2.8 INR RANGE 2.0-3.0  Dietary changes: no    Health status changes: no    Bleeding/hemorrhagic complications: no    Recent/future hospitalizations: no    Any changes in medication regimen? no    Recent/future dental: no  Any missed doses?: no       Is patient compliant with meds? yes       Current Medications (verified): 1)  Aspirin 81 Mg  Tbec (Aspirin) .... One By Mouth Every Day 2)  Allopurinol 100 Mg Tabs (Allopurinol) .Marland Kitchen.. 1po Once Daily 3)  Warfarin Sodium 5 Mg Tabs (Warfarin Sodium) .... Take 1 Tablet Daily or As Directed By Anticoagulation Clinic  Allergies: No Known Drug Allergies  Anticoagulation Management History:      The patient is taking warfarin and comes in today for a routine follow up visit.  Positive risk factors for bleeding include an age of 71 years or older.  The bleeding index is 'intermediate risk'.  Positive CHADS2 values include History of HTN.  Negative CHADS2 values include Age > 43 years old.  His last INR was 1.0 RATIO.  Anticoagulation responsible provider: Sissy Goetzke.  INR POC: 2.8.  Cuvette Lot#: [id number].  Exp: 02/2011.    Anticoagulation Management Assessment/Plan:      The patient's current anticoagulation dose is Warfarin sodium 5 mg tabs: Take 1 tablet daily or as directed by Anticoagulation Clinic.  The target INR is 2.0-3.0.  The next INR is due 04/06/2010.  Anticoagulation instructions were given to patient.  Results were reviewed/authorized by Weston Brass, PharmD.  He was notified by Weston Brass PharmD.         Prior Anticoagulation Instructions: INR 2.2  Stop Lovenox injections.  Decrease Coumadin to 1 1/2 tablets every day except 1 tablet on Monday and Friday.  Recheck INR in 1 week.   Current  Anticoagulation Instructions: INR 2.8  Continue same dose of 1 1/2 tablets every day except 1 tablet on Monday and Friday.  Recheck INR in 2 weeks.

## 2010-03-30 NOTE — Assessment & Plan Note (Signed)
Summary: post [place]/er   Vital Signs:  Patient profile:   70 year old male Height:      69 inches Weight:      206.13 pounds BMI:     30.55 O2 Sat:      97 % on Room air Temp:     98 degrees F oral Pulse rate:   65 / minute BP sitting:   112 / 72  (left arm) Cuff size:   large  Vitals Entered By: Zella Ball Ewing CMA Duncan Dull) (February 13, 2010 9:33 AM)  O2 Flow:  Room air  CC: post Hospital/RE   CC:  post Hospital/RE.  History of Present Illness: here for several issues, was seen in the ER recently with another episode of unexplained general weakness and low BP, improved with tx, and referred here to consider further eval and/or referral;  fortunately pt has not had futher symptoms and since the last episode Pt denies CP, worsening sob, doe, wheezing, orthopnea, pnd, worsening LE edema, palps, dizziness or syncope  Pt denies new neuro symptoms such as headache, facial or extremity weakness  Pt denies polydipsia, polyuria  Overall good compliance with meds, trying to follow low chol diet, wt stable, little excercise however .  Has hx of DVT RLE with stable swelling for which he is still concerned,  also with pain and swelling to the right ankle for over a wk ,mild but persistnet and for which the pain and swelling might be worse with more ambulation activity, better with rest, not clear about nsaids.  Also mentions another concern of discomfort to end urination for the last several days for unclear reasons;  denies fever, back pain, n/v, hematuria, freq, urgcency or change in hesitancy/slow flow.    Problems Prior to Update: 1)  Dysuria  (ICD-788.1) 2)  Unspecified Hypotension  (ICD-458.9) 3)  Ankle Pain, Right  (ICD-719.47) 4)  Anxiety  (ICD-300.00) 5)  Leg Pain, Right  (ICD-729.5) 6)  Leg Cramps  (ICD-729.82) 7)  Glucose Intolerance  (ICD-271.3) 8)  Chest Pain, Right  (ICD-786.50) 9)  Preventive Health Care  (ICD-V70.0) 10)  Leg Pain, Right  (ICD-729.5) 11)  Glucose Intolerance   (ICD-271.3) 12)  Uri  (ICD-465.9) 13)  Plantar Fasciitis  (ICD-728.71) 14)  Prostate Cancer, Hx of  (ICD-V10.46) 15)  Internal Hemorrhoids Without Mention Comp  (ICD-455.0) 16)  Dysuria  (ICD-788.1) 17)  Fatigue  (ICD-780.79) 18)  Diverticulosis, Colon  (ICD-562.10) 19)  Hypertension  (ICD-401.9) 20)  Leg Pain, Right  (ICD-729.5) 21)  Leg Edema  (ICD-782.3) 22)  Benign Prostatic Hypertrophy, Hx of  (ICD-V13.8) 23)  Hypercholesterolemia  (ICD-272.0) 24)  Colonic Polyps  (ICD-211.3) 25)  Dvt  (ICD-453.40)  Medications Prior to Update: 1)  Aspirin 81 Mg  Tbec (Aspirin) .... One By Mouth Every Day 2)  Pneumovax 23 25 Mcg/0.70ml Inj (Pneumococcal Vac Polyvalent) .... Use As Directed 3)  Prednisone 10 Mg Tabs (Prednisone) .... 3po Qd For 3days, Then 2po Qd For 3days, Then 1po Qd For 3days, Then Stop 4)  Indomethacin 50 Mg Caps (Indomethacin) .Marland Kitchen.. 1po Three Times A Day As Needed Gout Attack  Current Medications (verified): 1)  Aspirin 81 Mg  Tbec (Aspirin) .... One By Mouth Every Day 2)  Prednisone 10 Mg Tabs (Prednisone) .... 3po Qd For 3days, Then 2po Qd For 3days, Then 1po Qd For 3days, Then Stop 3)  Indomethacin 50 Mg Caps (Indomethacin) .Marland Kitchen.. 1po Three Times A Day As Needed Gout Attack 4)  Allopurinol 100 Mg  Tabs (Allopurinol) .Marland Kitchen.. 1po Once Daily  Allergies (verified): No Known Drug Allergies  Past History:  Past Medical History: Last updated: 11/01/2009 hx of dvt 2004 colon polyps, hx of BPH hypercholesterolemia Hypertension Diverticulosis, colon left foot DJD Prostate cancer, hx of plantar fasciitis glucose intolerance Anxiety  Past Surgical History: Last updated: 11/01/2009 Uvulectomy for snoring, in his 20's s/p prestate seeds for prostate  ca dec 2009  Social History: Last updated: 11/01/2009 Current Smoker Alcohol use-yes 2 children retired - [employer] Drug use-no Married  Risk Factors: Smoking Status: current  (10/21/2007)  Review of Systems       all otherwise negative per pt -    Physical Exam  General:  alert and overweight-appearing.   Head:  normocephalic and atraumatic.   Eyes:  vision grossly intact, pupils equal, and pupils round.   Ears:  R ear normal and L ear normal.   Nose:  no external deformity and no nasal discharge.   Mouth:  no gingival abnormalities and pharynx pink and moist.   Neck:  supple and no masses.   Lungs:  normal respiratory effort and normal breath sounds.   Heart:  normal rate and regular rhythm.   Abdomen:  soft, non-tender, and normal bowel sounds.   Genitalia:   No scrotal masses or lesions. No penis lesions or urethral discharge. Msk:  mild right ankle effusion and warmth, no apprecialbe redness and only mild tender at this point;  adjacent leg and foot without red, swelling, tender,  and neg homans but does have some trace post phlebitic sweling to the leg as well Extremities:  no edema, no erythema  Neurologic:  right foot o/w neurovasc intact   Impression & Recommendations:  Problem # 1:  UNSPECIFIED HYPOTENSION (ICD-458.9)  recurrent, with weakness ;  will need echo and card referral  Orders: Echo Referral (Echo) Cardiology Referral (Cardiology)  Problem # 2:  ANKLE PAIN, RIGHT (ICD-719.47)  with warmth and sweling to right ankle, worse to stand in one spot, better to walk, overall mikld - suspect DJD  - will check film; but if neg would have to consider low grade acute gouty episode - ok for prednisone 10 pack;  consider allopurinol prophylaxis  Orders: T-Ankle Comp Right (73610TC)  Problem # 3:  LEG EDEMA (ICD-782.3) chronic rle post phlbetic - educated, reassured  Problem # 4:  HYPERCHOLESTEROLEMIA (ICD-272.0)  Labs Reviewed: SGOT: 23 (10/27/2009)   SGPT: 20 (10/27/2009)   HDL:38.70 (10/27/2009), 38.70 (11/03/2008)  LDL:105 (10/27/2009), 104 (11/03/2008)  Chol:165 (10/27/2009), 156 (11/03/2008)  Trig:108.0 (10/27/2009), 69.0  (11/03/2008) stable overall by hx and exam, ok to continue meds/tx as is - declines statin  Problem # 5:  DYSURIA (ICD-788.1)  ? clinical signficiance - for urine studies today  Orders: T-Culture, Urine (16109-60454) TLB-Udip w/ Micro (81001-URINE)  Complete Medication List: 1)  Aspirin 81 Mg Tbec (Aspirin) .... One by mouth every day 2)  Prednisone 10 Mg Tabs (Prednisone) .... 3po qd for 3days, then 2po qd for 3days, then 1po qd for 3days, then stop 3)  Indomethacin 50 Mg Caps (Indomethacin) .Marland Kitchen.. 1po three times a day as needed gout attack 4)  Allopurinol 100 Mg Tabs (Allopurinol) .Marland Kitchen.. 1po once daily  Patient Instructions: 1)  Please go to Radiology in the basement level for your X-Ray today  2)  Please go to the Lab in the basement for your urine tests today 3)  Please take all new medications as prescribed - the prednisone 4)  Continue all previous medications as before this visit  5)  You will be contacted about the referral(s) to: echocardiogram and cardiology 6)  please follow lower chol diet 7)  Please schedule a follow-up appointment in September 2012 for CPX with labs Prescriptions: PREDNISONE 10 MG TABS (PREDNISONE) 3po qd for 3days, then 2po qd for 3days, then 1po qd for 3days, then stop  #18 x 0   Entered and Authorized by:   Corwin Levins MD   Signed by:   Corwin Levins MD on 02/13/2010   Method used:   Electronically to        [facility]  [phone]* (retail)       [address]       [place], [place]  [address]       Ph: [phone] or [phone]       Fax: [phone]   RxID:   [id number]    Orders Added: 1)  T-Culture, Urine [[id number]] 2)  T-Ankle Comp Right [73610TC] 3)  TLB-Udip w/ Micro [81001-URINE] 4)  Echo Referral [Echo] 5)  Cardiology Referral [Cardiology] 6)  Est. Patient Level IV [29518]

## 2010-03-30 NOTE — Medication Information (Signed)
Summary: Coumadin Clinic  Anticoagulant Therapy  Managed by: Weston Brass, PharmD Referring MD: Tito Dine MD: Ladona Ridgel MD, Sharlot Gowda Indication 1: Pulmonary Embolism Lab Used: [facility] [place] Site: [address] INR RANGE 2.0-3.0          Comments: Pt with new PE on CT scan in office today.  Spoke with Dr. Tenny Craw.  Hypercoag panel, CBC and BMET ordered.  Will need to go to spectrum and draw.  Pt is aware and has lab orders.  He has had a DVT in the past (>5 yrs ago) and was managed in [place].  Discussed Lovenox and Coumadin instructions with pt and wife.  He was given Lovenox 100mg  syringe to inject after he had labwork drawn.   Allergies: No Known Drug Allergies  Anticoagulation Management History:      The patient is taking warfarin and comes in today for a routine follow up visit.  Positive risk factors for bleeding include an age of 88 years or older.  The bleeding index is 'intermediate risk'.  Positive CHADS2 values include History of HTN.  Negative CHADS2 values include Age > 47 years old.  His last INR was 1.0 RATIO.  Anticoagulation responsible provider: Ladona Ridgel MD, Sharlot Gowda.    Anticoagulation Management Assessment/Plan:      The patient's current anticoagulation dose is Warfarin sodium 5 mg tabs: Take 1 tablet daily or as directed by Anticoagulation Clinic.  The next INR is due 03/13/2010.  Results were reviewed/authorized by Weston Brass, PharmD.         Current Anticoagulation Instructions: 1. Start Lovenox (enoxaparin) 100mg  injection in abdomen every 12 hours 2. Start Coumadin 5mg  every day. 3. Recheck INR on 1/16.  Prescriptions: ENOXAPARIN SODIUM 100 MG/ML SOLN (ENOXAPARIN SODIUM) Inject 1 syringe every 12 hours as directed  #10 syringes x 1   Entered by:   Weston Brass PharmD   Authorized by:   Sherrill Raring, MD, [facility]   Signed by:   Weston Brass PharmD on 03/09/2010   Method used:   Electronically to        [facility]  [phone]*  (retail)       [address]       [place], [place]  [address]       Ph: [phone] or [phone]       Fax: [phone]   RxID:   [id number] WARFARIN SODIUM 5 MG TABS (WARFARIN SODIUM) Take 1 tablet daily or as directed by Anticoagulation Clinic  #45 x 1   Entered by:   Weston Brass PharmD   Authorized by:   Sherrill Raring, MD, [facility]   Signed by:   Weston Brass PharmD on 03/09/2010   Method used:   Electronically to        [facility]  [phone]* (retail)       [address]       [place], [place]  [address]       Ph: [phone] or [phone]       Fax: [phone]   RxID:   [id number]

## 2010-03-30 NOTE — Progress Notes (Signed)
Summary: pt request call   Phone Note Call from Patient Call back at [phone]   Caller: Patient Summary of Call: pt request call Initial call taken by: Judie Grieve,  March 09, 2010 2:15 PM  Follow-up for Phone Call        Spoke with pt.  Called pharmacy and Lovenox injection were going to be >$400 and pt could not afford.  I checked our supply here and had enough of the 100mg  syringes to suppy pt until Monday.  He is aware to come by the office this afternoon to pick up.  Follow-up by: Weston Brass PharmD,  March 09, 2010 3:36 PM

## 2010-03-30 NOTE — Medication Information (Signed)
Summary: new PE/sp   Anticoagulant Therapy  Managed by: Weston Brass, PharmD Referring MD: Tito Dine MD: Ladona Ridgel MD, Sharlot Gowda Indication 1: Pulmonary Embolism Lab Used: [facility] [place] Site: [address] INR RANGE 2.0-3.0          Comments: Pt with new PE on CT scan in office today.  Spoke with Dr. Tenny Craw.  Hypercoag panel, CBC and BMET ordered.  Will need to go to spectrum and draw.  Pt is aware and has lab orders.  He has had a DVT in the past (>5 yrs ago) and was managed in [place].  Discussed Lovenox and Coumadin instructions with pt and wife.  He was given Lovenox 100mg  syringe to inject after he had labwork drawn.   Allergies: No Known Drug Allergies  Anticoagulation Management History:      The patient comes in today for his initial visit for anticoagulation therapy.  Positive risk factors for bleeding include an age of 70 years or older.  The bleeding index is 'intermediate risk'.  Positive CHADS2 values include History of HTN.  Negative CHADS2 values include Age > 55 years old.  His last INR was 1.0 RATIO.  Anticoagulation responsible provider: Ladona Ridgel MD, Sharlot Gowda.    Anticoagulation Management Assessment/Plan:      The patient's current anticoagulation dose is Warfarin sodium 5 mg tabs: Take 1 tablet daily or as directed by Anticoagulation Clinic.  The next INR is due 03/13/2010.  Anticoagulation instructions were given to patient.  Results were reviewed/authorized by Weston Brass, PharmD.  He was notified by Weston Brass PharmD.         Current Anticoagulation Instructions: Start Lovenox 100mg  subcutaneously injection every 12 hours today Start Coumadin 5mg  daily today Recheck INR on 03/13/10

## 2010-03-30 NOTE — Medication Information (Signed)
Summary: rov/sp   Anticoagulant Therapy  Managed by: Weston Brass, PharmD Referring MD: Tito Dine MD: Daleen Squibb MD, Maisie Fus Indication 1: Pulmonary Embolism Lab Used: [facility] [place] Site: [address] INR POC 1.4 INR RANGE 2.0-3.0  Dietary changes: no    Health status changes: no    Bleeding/hemorrhagic complications: no    Recent/future hospitalizations: no    Any changes in medication regimen? no    Recent/future dental: no  Any missed doses?: no       Is patient compliant with meds? yes       Allergies: No Known Drug Allergies  Anticoagulation Management History:      The patient is taking warfarin and comes in today for a routine follow up visit.  Positive risk factors for bleeding include an age of 70 years or older.  The bleeding index is 'intermediate risk'.  Positive CHADS2 values include History of HTN.  Negative CHADS2 values include Age > 71 years old.  His last INR was 1.0 RATIO.  Anticoagulation responsible provider: Daleen Squibb MD, Maisie Fus.  INR POC: 1.4.  Cuvette Lot#: [id number].  Exp: 03/2011.    Anticoagulation Management Assessment/Plan:      The patient's current anticoagulation dose is Warfarin sodium 5 mg tabs: Take 1 tablet daily or as directed by Anticoagulation Clinic.  The target INR is 2.0-3.0.  The next INR is due 03/16/2010.  Anticoagulation instructions were given to patient.  Results were reviewed/authorized by Weston Brass, PharmD.  He was notified by Weston Brass, PharmD.         Prior Anticoagulation Instructions: Start Lovenox 100mg  subcutaneously injection every 12 hours today Start Coumadin 5mg  daily today Recheck INR on 03/13/10  Current Anticoagulation Instructions: INR 1.4  Coumadin 5 mg tablets - Take 1.5 tablets daily.  Continue Lovenox injections until next office visit.  return to clinic on Thursday.

## 2010-03-30 NOTE — Miscellaneous (Signed)
Summary: Orders Update  Clinical Lists Changes  Problems: Added new problem of PULMONARY EMBOLISM (ICD-415.19) - Signed Orders: Added new Test order of T-Hypercoagulation Profile [phone]) - Signed Added new Test order of TLB-CBC Platelet - w/Differential (85025-CBCD) - Signed Added new Test order of TLB-BMP (Basic Metabolic Panel-BMET) (80048-METABOL) - Signed Added new Referral order of Echocardiogram (Echo) - Signed Added new Test order of Venous Duplex Lower Extremity (Venous Duplex Lower) - Signed

## 2010-04-06 ENCOUNTER — Encounter: Payer: Self-pay | Admitting: Internal Medicine

## 2010-04-06 ENCOUNTER — Encounter (INDEPENDENT_AMBULATORY_CARE_PROVIDER_SITE_OTHER): Payer: MEDICARE

## 2010-04-06 DIAGNOSIS — I2699 Other pulmonary embolism without acute cor pulmonale: Secondary | ICD-10-CM

## 2010-04-06 DIAGNOSIS — Z7901 Long term (current) use of anticoagulants: Secondary | ICD-10-CM

## 2010-04-07 LAB — CONVERTED CEMR LAB
AntiThromb III Func: 97 % (ref 76–126)
Anticardiolipin IgA: 0 (ref <22)
CO2: 27 meq/L (ref 19–32)
Calcium: 9.6 mg/dL (ref 8.4–10.5)
Eosinophils Relative: 4 % (ref 0–5)
Glucose, Bld: 113 mg/dL — ABNORMAL HIGH (ref 70–99)
HCT: 53.2 % — ABNORMAL HIGH (ref 39.0–52.0)
Lymphocytes Relative: 47 % — ABNORMAL HIGH (ref 12–46)
Lymphs Abs: 2.6 10*3/uL (ref 0.7–4.0)
Platelets: 264 10*3/uL (ref 150–400)
Potassium: 4.6 meq/L (ref 3.5–5.3)
Protein S Ag, Total: 97 % (ref 70–140)
RBC: 5.98 M/uL — ABNORMAL HIGH (ref 4.22–5.81)
Sodium: 138 meq/L (ref 135–145)
WBC: 5.5 10*3/uL (ref 4.0–10.5)

## 2010-04-10 DIAGNOSIS — I82409 Acute embolism and thrombosis of unspecified deep veins of unspecified lower extremity: Secondary | ICD-10-CM

## 2010-04-10 DIAGNOSIS — I2699 Other pulmonary embolism without acute cor pulmonale: Secondary | ICD-10-CM

## 2010-04-13 NOTE — Medication Information (Signed)
Summary: [facility] Clinic   Anticoagulant Therapy  Managed by: Weston Brass, PharmD Referring MD: Tito Dine MD: Johney Frame MD, Fayrene Fearing Indication 1: Pulmonary Embolism Lab Used: [facility] [place] Site: [address] INR POC 3.0 INR RANGE 2.0-3.0  Dietary changes: no    Health status changes: no    Bleeding/hemorrhagic complications: no    Recent/future hospitalizations: no    Any changes in medication regimen? no    Recent/future dental: no  Any missed doses?: no       Is patient compliant with meds? yes       Allergies: No Known Drug Allergies  Anticoagulation Management History:      The patient is taking warfarin and comes in today for a routine follow up visit.  Positive risk factors for bleeding include an age of 19 years or older.  The bleeding index is 'intermediate risk'.  Positive CHADS2 values include History of HTN.  Negative CHADS2 values include Age > 73 years old.  His last INR was 1.0 RATIO.  Anticoagulation responsible provider: Tyia Binford MD, Fayrene Fearing.  INR POC: 3.0.  Cuvette Lot#: [id number].  Exp: 02/2011.    Anticoagulation Management Assessment/Plan:      The patient's current anticoagulation dose is Warfarin sodium 5 mg tabs: Take 1 tablet daily or as directed by Anticoagulation Clinic.  The target INR is 2.0-3.0.  The next INR is due 04/27/2010.  Anticoagulation instructions were given to patient.  Results were reviewed/authorized by Weston Brass, PharmD.  He was notified by Weston Brass PharmD.         Prior Anticoagulation Instructions: INR 2.8  Continue same dose of 1 1/2 tablets every day except 1 tablet on Monday and Friday.  Recheck INR in 2 weeks.   Current Anticoagulation Instructions: INR 3.0  Continue same dose of 1 1/2 tablet every day except 1 tablet on Monday and Friday.  Recheck INR in 3 weeks.

## 2010-04-19 ENCOUNTER — Telehealth: Payer: Self-pay | Admitting: Internal Medicine

## 2010-04-25 NOTE — Progress Notes (Signed)
Summary: test results   Phone Note Call from Patient Call back at Home Phone [phone]   Caller: Patient Reason for Call: Talk to Nurse, Lab or Test Results Summary of Call: pt calling back re test results. lower venous Initial call taken by: Roe Coombs,  April 19, 2010 9:25 AM  Follow-up for Phone Call        Reviewed results with patient-Dr. Tenny Craw will discuss in more detail at appt. on 05/22/10. Follow-up by: Dessie Coma  LPN,  April 19, 2010 10:00 AM

## 2010-04-27 ENCOUNTER — Encounter: Payer: Self-pay | Admitting: Internal Medicine

## 2010-04-27 ENCOUNTER — Encounter (INDEPENDENT_AMBULATORY_CARE_PROVIDER_SITE_OTHER): Payer: Medicare Other

## 2010-04-27 DIAGNOSIS — I2699 Other pulmonary embolism without acute cor pulmonale: Secondary | ICD-10-CM

## 2010-04-27 DIAGNOSIS — Z7901 Long term (current) use of anticoagulants: Secondary | ICD-10-CM

## 2010-05-04 ENCOUNTER — Other Ambulatory Visit: Payer: Self-pay | Admitting: Internal Medicine

## 2010-05-04 ENCOUNTER — Other Ambulatory Visit: Payer: Medicare Other

## 2010-05-04 ENCOUNTER — Encounter (INDEPENDENT_AMBULATORY_CARE_PROVIDER_SITE_OTHER): Payer: Self-pay | Admitting: *Deleted

## 2010-05-04 DIAGNOSIS — E7800 Pure hypercholesterolemia, unspecified: Secondary | ICD-10-CM

## 2010-05-04 DIAGNOSIS — R7301 Impaired fasting glucose: Secondary | ICD-10-CM

## 2010-05-04 LAB — LIPID PANEL
Cholesterol: 162 mg/dL (ref 0–200)
HDL: 36.1 mg/dL — ABNORMAL LOW
LDL Cholesterol: 110 mg/dL — ABNORMAL HIGH (ref 0–99)
Total CHOL/HDL Ratio: 4
Triglycerides: 79 mg/dL (ref 0.0–149.0)
VLDL: 15.8 mg/dL (ref 0.0–40.0)

## 2010-05-04 LAB — HEMOGLOBIN A1C: Hgb A1c MFr Bld: 6.1 % (ref 4.6–6.5)

## 2010-05-04 LAB — BASIC METABOLIC PANEL
BUN: 16 mg/dL (ref 6–23)
Calcium: 9.3 mg/dL (ref 8.4–10.5)
Creatinine, Ser: 1.2 mg/dL (ref 0.4–1.5)
GFR: 79.16 mL/min (ref >60.00)

## 2010-05-04 NOTE — Medication Information (Signed)
Summary: rov/sp   Anticoagulant Therapy  Managed by: Weston Brass, PharmD Referring MD: Tito Dine MD: Tenny Craw MD, Gunnar Fusi Indication 1: Pulmonary Embolism Lab Used: [facility] [place] Site: [address] INR POC 3.3 INR RANGE 2.0-3.0  Dietary changes: no    Health status changes: no    Bleeding/hemorrhagic complications: no    Recent/future hospitalizations: no    Any changes in medication regimen? no    Recent/future dental: no  Any missed doses?: no       Is patient compliant with meds? yes       Allergies: No Known Drug Allergies  Anticoagulation Management History:      The patient is taking warfarin and comes in today for a routine follow up visit.  Positive risk factors for bleeding include an age of 71 years or older.  The bleeding index is 'intermediate risk'.  Positive CHADS2 values include History of HTN.  Negative CHADS2 values include Age > 68 years old.  His last INR was 1.0 RATIO.  Anticoagulation responsible provider: Tenny Craw MD, Gunnar Fusi.  INR POC: 3.3.  Cuvette Lot#: [id number].  Exp: 02/2011.    Anticoagulation Management Assessment/Plan:      The patient's current anticoagulation dose is Warfarin sodium 5 mg tabs: Take 1 tablet daily or as directed by Anticoagulation Clinic.  The target INR is 2.0-3.0.  The next INR is due 05/22/2010.  Anticoagulation instructions were given to patient.  Results were reviewed/authorized by Weston Brass, PharmD.  He was notified by Weston Brass PharmD.         Prior Anticoagulation Instructions: INR 3.0  Continue same dose of 1 1/2 tablet every day except 1 tablet on Monday and Friday.  Recheck INR in 3 weeks.   Current Anticoagulation Instructions: INR 3.3  Skip tomorrow's dose of Coumadin then decrease dose to 1 1/2 tablets every day except 1 tablet on Monday, Wednesday and Friday.  Recheck INR in 3 weeks.

## 2010-05-06 ENCOUNTER — Encounter: Payer: Self-pay | Admitting: Internal Medicine

## 2010-05-08 ENCOUNTER — Ambulatory Visit (INDEPENDENT_AMBULATORY_CARE_PROVIDER_SITE_OTHER): Payer: Medicare Other | Admitting: Internal Medicine

## 2010-05-08 ENCOUNTER — Encounter: Payer: Self-pay | Admitting: Internal Medicine

## 2010-05-08 DIAGNOSIS — E739 Lactose intolerance, unspecified: Secondary | ICD-10-CM

## 2010-05-08 DIAGNOSIS — I10 Essential (primary) hypertension: Secondary | ICD-10-CM

## 2010-05-08 DIAGNOSIS — L723 Sebaceous cyst: Secondary | ICD-10-CM | POA: Insufficient documentation

## 2010-05-08 DIAGNOSIS — E7800 Pure hypercholesterolemia, unspecified: Secondary | ICD-10-CM

## 2010-05-08 LAB — BASIC METABOLIC PANEL
Calcium: 9.6 mg/dL (ref 8.4–10.5)
Creatinine, Ser: 1.11 mg/dL (ref 0.4–1.5)
GFR calc Af Amer: >60 mL/min (ref >60)

## 2010-05-08 LAB — DIFFERENTIAL
Basophils Absolute: 0 10*3/uL (ref 0.0–0.1)
Eosinophils Absolute: 0.1 10*3/uL (ref 0.0–0.7)
Lymphocytes Relative: 27 % (ref 12–46)
Lymphs Abs: 1.7 10*3/uL (ref 0.7–4.0)
Neutrophils Relative %: 63 % (ref 43–77)

## 2010-05-08 LAB — CBC
Platelets: 264 10*3/uL (ref 150–400)
RBC: 5.82 MIL/uL — ABNORMAL HIGH (ref 4.22–5.81)
WBC: 6.4 10*3/uL (ref 4.0–10.5)

## 2010-05-08 LAB — POCT CARDIAC MARKERS: Myoglobin, poc: 88.9 ng/mL (ref 12–200)

## 2010-05-16 NOTE — Assessment & Plan Note (Signed)
Summary: 6 MO ROV/pt has a cyst on his back/NWS #   Vital Signs:  Patient profile:   70 year old male Height:      69 inches Weight:      211.25 pounds BMI:     31.31 O2 Sat:      97 % on Room air Temp:     98.5 degrees F oral Pulse rate:   79 / minute BP sitting:   130 / 78  (left arm) Cuff size:   large  Vitals Entered By: Zella Ball Ewing CMA (AAMA) (May 08, 2010 9:08 AM)  O2 Flow:  Room air CC: 6 month ROV/RE   CC:  6 month ROV/RE.  History of Present Illness: here for f/u  - overall doing ok,  Pt denies CP, worsening sob, doe, wheezing, orthopnea, pnd, worsening LE edema, palps, dizziness or syncope  Pt denies new neuro symptoms such as headache, facial or extremity weakness  Pt denies polydipsia, polyuria   Overall good compliance with meds, trying to follow low chol, DM diet, wt stable, little excercise however.  Denies worsening depressive symptoms, suicidal ideation, or panic.   Overall good compliance with meds, and good tolerability.  Does have acute onset 3 -4 days mild to mod area left mid back red, tender and swollen when he previously had a cyst;  could not sleep well last night.  No high fever , chills or drainage.  No prior hx of same to the area.    No documented hx of MRSA  Problems Prior to Update: 1)  Pulmonary Embolism  (ICD-415.19) 2)  Personal History of Pulmonary Embolism  (ICD-V12.55) 3)  Dysuria  (ICD-788.1) 4)  Unspecified Hypotension  (ICD-458.9) 5)  Ankle Pain, Right  (ICD-719.47) 6)  Anxiety  (ICD-300.00) 7)  Leg Pain, Right  (ICD-729.5) 8)  Leg Cramps  (ICD-729.82) 9)  Glucose Intolerance  (ICD-271.3) 10)  Chest Pain, Right  (ICD-786.50) 11)  Preventive Health Care  (ICD-V70.0) 12)  Leg Pain, Right  (ICD-729.5) 13)  Glucose Intolerance  (ICD-271.3) 14)  Uri  (ICD-465.9) 15)  Plantar Fasciitis  (ICD-728.71) 16)  Prostate Cancer, Hx of  (ICD-V10.46) 17)  Internal Hemorrhoids Without Mention Comp  (ICD-455.0) 18)  Dysuria  (ICD-788.1) 19)   Fatigue  (ICD-780.79) 20)  Diverticulosis, Colon  (ICD-562.10) 21)  Hypertension  (ICD-401.9) 22)  Leg Pain, Right  (ICD-729.5) 23)  Leg Edema  (ICD-782.3) 24)  Benign Prostatic Hypertrophy, Hx of  (ICD-V13.8) 25)  Hypercholesterolemia  (ICD-272.0) 26)  Colonic Polyps  (ICD-211.3) 27)  Dvt  (ICD-453.40)  Medications Prior to Update: 1)  Aspirin 81 Mg  Tbec (Aspirin) .... One By Mouth Every Day 2)  Allopurinol 100 Mg Tabs (Allopurinol) .Marland Kitchen.. 1po Once Daily 3)  Warfarin Sodium 5 Mg Tabs (Warfarin Sodium) .... Take 1 Tablet Daily or As Directed By Anticoagulation Clinic  Current Medications (verified): 1)  Aspirin 81 Mg  Tbec (Aspirin) .... One By Mouth Every Day 2)  Allopurinol 100 Mg Tabs (Allopurinol) .Marland Kitchen.. 1po Once Daily 3)  Warfarin Sodium 5 Mg Tabs (Warfarin Sodium) .... Take 1 Tablet Daily or As Directed By Anticoagulation Clinic 4)  Doxycycline Hyclate 100 Mg Caps (Doxycycline Hyclate) .Marland Kitchen.. 1po Two Times A Day 5)  Tramadol Hcl 50 Mg Tabs (Tramadol Hcl) .Marland Kitchen.. 1po Q 6 Hrs As Needed Pain  Allergies (verified): No Known Drug Allergies  Past History:  Past Medical History: Last updated: 03/06/2010 hx of dvt 2004 (R leg).   colon polyps, hx of BPH  hypercholesterolemia Hypertension Diverticulosis, colon left foot DJD Prostate cancer, hx of plantar fasciitis glucose intolerance Anxiety  Past Surgical History: Last updated: 11/01/2009 Uvulectomy for snoring, in his 20's s/p prestate seeds for prostate  ca dec 2009  Social History: Last updated: 11/01/2009 Current Smoker Alcohol use-yes 2 children retired - [employer] Drug use-no Married  Risk Factors: Smoking Status: current (10/21/2007)  Review of Systems       all otherwise negative per pt -    Physical Exam  General:  alert and overweight-appearing.  , not ill appearing Head:  normocephalic and atraumatic.   Eyes:  vision grossly intact, pupils equal, and pupils round.     Ears:  R ear normal and L ear normal.   Nose:  no external deformity and no nasal discharge.   Mouth:  good dentition, no gingival abnormalities, and pharynx pink and moist.   Neck:  supple and no masses.   Lungs:  normal respiratory effort and normal breath sounds.   Heart:  normal rate and regular rhythm.   Extremities:  .non o Skin:  left mid backf/flank area iwth aprpox 4 cm very tender inflamed seb cyst area, without drainage or red streaks   Impression & Recommendations:  Problem # 1:  SEBACEOUS CYST, INFECTED (ICD-706.2)  quite large, left mid back , about 4 cm, marked tender likely infection;  for antibx, pain med and refer today to gen surgury  Orders: Surgical Referral (Surgery)  Problem # 2:  GLUCOSE INTOLERANCE (ICD-271.3) stable overall by hx and exam, ok to continue meds/tx as is   Problem # 3:  HYPERTENSION (ICD-401.9)  BP today: 130/78 Prior BP: 156/101 (03/06/2010)  Labs Reviewed: K+: 5.0 (05/04/2010) Creat: : 1.2 (05/04/2010)   Chol: 162 (05/04/2010)   HDL: 36.10 (05/04/2010)   LDL: 110 (05/04/2010)   TG: 79.0 (05/04/2010) stable overall by hx and exam, ok to continue meds/tx as is  - no meds needed at this time  Problem # 4:  HYPERCHOLESTEROLEMIA (ICD-272.0)  Labs Reviewed: SGOT: 23 (10/27/2009)   SGPT: 20 (10/27/2009)   HDL:36.10 (05/04/2010), 38.70 (10/27/2009)  LDL:110 (05/04/2010), 105 (10/27/2009)  Chol:162 (05/04/2010), 165 (10/27/2009)  Trig:79.0 (05/04/2010), 108.0 (10/27/2009) d/w pt - mild elev LDL only with goal LDL < 100';  he prefers diet though I have suggested 20 mg pravatatatin, and increased activyty, and f/u next visit  Complete Medication List: 1)  Aspirin 81 Mg Tbec (Aspirin) .... One by mouth every day 2)  Allopurinol 100 Mg Tabs (Allopurinol) .Marland Kitchen.. 1po once daily 3)  Warfarin Sodium 5 Mg Tabs (Warfarin sodium) .... Take 1 tablet daily or as directed by anticoagulation clinic 4)  Doxycycline Hyclate 100 Mg Caps (Doxycycline hyclate)  .Marland Kitchen.. 1po two times a day 5)  Tramadol Hcl 50 Mg Tabs (Tramadol hcl) .Marland Kitchen.. 1po q 6 hrs as needed pain  Patient Instructions: 1)  Continue all previous medications as before this visit 2)   Please follow lower cholesterol diet 3)  Please take all new medications as prescribed - the antibiotic and pain medicaiton 4)  You will be contacted about the referral(s) to: general surgury  - to see our [facility] before leaving today 5)  Please schedule a follow-up appointment in 6 months for CPX with labs and: 6)  HbgA1C prior to visit, ICD-9: 790.2 Prescriptions: TRAMADOL HCL 50 MG TABS (TRAMADOL HCL) 1po q 6 hrs as needed pain  #60 x 1   Entered and Authorized by:   Corwin Levins MD  Signed by:   Corwin Levins MD on 05/08/2010   Method used:   Print then Give to Patient   RxID:   [id number] DOXYCYCLINE HYCLATE 100 MG CAPS (DOXYCYCLINE HYCLATE) 1po two times a day  #20 x 0   Entered and Authorized by:   Corwin Levins MD   Signed by:   Corwin Levins MD on 05/08/2010   Method used:   Print then Give to Patient   RxID:   [id number]    Orders Added: 1)  Surgical Referral [Surgery] 2)  Est. Patient Level IV [84696]

## 2010-05-22 ENCOUNTER — Encounter: Payer: Self-pay | Admitting: Internal Medicine

## 2010-05-22 ENCOUNTER — Ambulatory Visit (INDEPENDENT_AMBULATORY_CARE_PROVIDER_SITE_OTHER): Payer: Medicare Other | Admitting: Internal Medicine

## 2010-05-22 ENCOUNTER — Ambulatory Visit (INDEPENDENT_AMBULATORY_CARE_PROVIDER_SITE_OTHER): Payer: Medicare Other | Admitting: *Deleted

## 2010-05-22 DIAGNOSIS — I10 Essential (primary) hypertension: Secondary | ICD-10-CM

## 2010-05-22 DIAGNOSIS — I2699 Other pulmonary embolism without acute cor pulmonale: Secondary | ICD-10-CM

## 2010-05-22 DIAGNOSIS — J069 Acute upper respiratory infection, unspecified: Secondary | ICD-10-CM

## 2010-05-22 DIAGNOSIS — I82409 Acute embolism and thrombosis of unspecified deep veins of unspecified lower extremity: Secondary | ICD-10-CM

## 2010-05-22 DIAGNOSIS — R0989 Other specified symptoms and signs involving the circulatory and respiratory systems: Secondary | ICD-10-CM

## 2010-05-22 DIAGNOSIS — E7800 Pure hypercholesterolemia, unspecified: Secondary | ICD-10-CM

## 2010-05-22 DIAGNOSIS — Z7901 Long term (current) use of anticoagulants: Secondary | ICD-10-CM

## 2010-05-22 DIAGNOSIS — R0602 Shortness of breath: Secondary | ICD-10-CM

## 2010-05-22 DIAGNOSIS — R0609 Other forms of dyspnea: Secondary | ICD-10-CM

## 2010-05-22 LAB — POCT INR: INR: 2.6

## 2010-05-22 MED ORDER — LISINOPRIL 5 MG PO TABS: 5.0000 mg | ORAL_TABLET | Freq: Every day | ORAL | Status: DC

## 2010-05-22 NOTE — Progress Notes (Signed)
HPI Patient is a 70 year old who I saw in the fall for hypotension.  He had a LE DVT (chronic)   I rec a CT and this was positive for pulmonary embolus The patient has been on coumadin since He does note that his breathing is improving.  He does not get SOB with stairs.  He is walking  NO chest pains.  No dizziness. No Known Allergies  Current Outpatient Prescriptions  Medication Sig Dispense Refill  . allopurinol (ZYLOPRIM) 100 MG tablet Take 100 mg by mouth as needed.       Marland Kitchen aspirin 81 MG tablet Take 81 mg by mouth daily. On hold      . warfarin (COUMADIN) 5 MG tablet Take by mouth as directed.          Past Medical History  Diagnosis Date  . History of DVT (deep vein thrombosis)   . History of colon polyps   . BPH (benign prostatic hyperplasia)   . Hypercholesteremia   . HTN (hypertension)   . Diverticulosis of colon   . DJD (degenerative joint disease)     left food  . Cancer of prostate   . Plantar fasciitis   . Glucose intolerance (impaired glucose tolerance)   . Anxiety     Past Surgical History  Procedure Date  . Uvulectomy   . Insertion prostate radiation seed     Family History  Problem Relation Age of Onset  . Deep vein thrombosis Mother     History   Social History  . Marital Status: Married    Spouse Name: N/A    Number of Children: 2  . Years of Education: N/A   Occupational History  . retired    Social History Main Topics  . Smoking status: Current Everyday Smoker  . Smokeless tobacco: Not on file  . Alcohol Use: Yes  . Drug Use: No  . Sexually Active: Not on file   Other Topics Concern  . Not on file   Social History Narrative  . No narrative on file    Review of Systems:  All systems reviewed.  They are negative to the above problem except as previously stated.  Vital Signs: BP 150/90  Pulse 80  Resp 18  Ht 5\' 9"  (1.753 m)  Wt 208 lb 0.6 oz (94.366 kg)  BMI 30.72 kg/m2  Physical Exam  HEENT:  Normocephalic, atraumatic.  EOMI, PERRLA.  Neck: JVP is normal. No thyromegaly. No bruits.  Lungs: clear to auscultation. No rales no wheezes.  Heart: Regular rate and rhythm. Normal S1, S2. No S3.   No significant murmurs. PMI not displaced.  Abdomen:  Supple, nontender. Normal bowel sounds. No masses. No hepatomegaly.  Extremities:   Good distal pulses throughout. No lower extremity edema.  Musculoskeletal :moving all extremities.  Neuro:   alert and oriented x3.  CN II-XII grossly intact.  EKG:  NSR.  76 bpm.  T wave inversion V5, V6, II, III, AVF.   Assessment and Plan:

## 2010-05-22 NOTE — Progress Notes (Deleted)
   Patient ID: Kevin Hale, male    DOB: 06/10/1940, 69 y.o.   MRN: [id number]  HPI    Review of Systems    Physical Exam      

## 2010-05-22 NOTE — Assessment & Plan Note (Signed)
Patient with chronic RLE DVT.  PE on CT   Doing well on coumadin.  No dizziness.  Breathing is improving.   Would keep on lifelong coumadin.  Will need cross coverage with Lovenox in the future for surgery.

## 2010-05-22 NOTE — Assessment & Plan Note (Signed)
BP is high again today.  I would recommend adding 5 mg LIsinopril   Check BMET. Next wk.  F/U BP with check when he returns to coumadin clinic in 1 month.

## 2010-05-22 NOTE — Patient Instructions (Signed)
INR 2.6 Continue 1.5 pills everyday except 1 pill on Mondays, Wednesdays and Fridays. Recheck in 4 weeks.

## 2010-05-22 NOTE — Progress Notes (Deleted)
   Patient ID: Kevin Hale, male    DOB: [birth year], 70 y.o.   MRN: [id number]  HPI    Review of Systems    Physical Exam

## 2010-05-22 NOTE — Patient Instructions (Signed)
Your physician has recommended you make the following change in your medication: start Lisinopril 5 mg every day Your physician recommends that you return for lab work in: 10 days at the [address] Office BMET 402.1 Your physician recommends that you schedule a follow-up appointment in: 6 months with Dr.Ross Blood pressure check at next visit with Coumadin Clinic

## 2010-05-22 NOTE — Assessment & Plan Note (Signed)
Reviewed recent lipids with patient.  LDL was 110, HDL was 36.  Pt avoids saturated fats. ABIS in past werer normal. Would continue as he is doing.

## 2010-05-25 NOTE — Letter (Signed)
Summary: [facility]   Imported By: Sherian Rein 05/15/2010 11:49:08  _____________________________________________________________________  External Attachment:    Type:   Image     Comment:   External Document

## 2010-06-01 ENCOUNTER — Other Ambulatory Visit: Payer: Medicare Other

## 2010-06-07 ENCOUNTER — Other Ambulatory Visit (INDEPENDENT_AMBULATORY_CARE_PROVIDER_SITE_OTHER): Payer: Medicare Other

## 2010-06-07 DIAGNOSIS — I10 Essential (primary) hypertension: Secondary | ICD-10-CM

## 2010-06-07 DIAGNOSIS — I2699 Other pulmonary embolism without acute cor pulmonale: Secondary | ICD-10-CM

## 2010-06-07 DIAGNOSIS — E7800 Pure hypercholesterolemia, unspecified: Secondary | ICD-10-CM

## 2010-06-07 DIAGNOSIS — R0989 Other specified symptoms and signs involving the circulatory and respiratory systems: Secondary | ICD-10-CM

## 2010-06-07 DIAGNOSIS — R0609 Other forms of dyspnea: Secondary | ICD-10-CM

## 2010-06-07 LAB — BASIC METABOLIC PANEL
BUN: 18 mg/dL (ref 6–23)
CO2: 30 mEq/L (ref 19–32)
Calcium: 9.3 mg/dL (ref 8.4–10.5)
Creatinine, Ser: 1.1 mg/dL (ref 0.4–1.5)

## 2010-06-15 ENCOUNTER — Telehealth: Payer: Self-pay | Admitting: *Deleted

## 2010-06-15 NOTE — Telephone Encounter (Addendum)
Message copied by Layne Benton on Thu Jun 15, 2010  7:32 PM ------      Message from: Dietrich Pates      Created: Thu Jun 08, 2010  5:41 PM       Glucose is high otherwise BMET is OK.  Called patient with lab results.

## 2010-06-15 NOTE — Telephone Encounter (Deleted)
Message copied by Layne Benton on Thu Jun 15, 2010  7:35 PM ------      Message from: Dietrich Pates      Created: Thu Jun 08, 2010  5:41 PM       Glucose is high otherwise BMET is OK.

## 2010-06-19 ENCOUNTER — Telehealth: Payer: Self-pay

## 2010-06-19 ENCOUNTER — Ambulatory Visit (INDEPENDENT_AMBULATORY_CARE_PROVIDER_SITE_OTHER): Payer: Medicare Other | Admitting: *Deleted

## 2010-06-19 DIAGNOSIS — I82409 Acute embolism and thrombosis of unspecified deep veins of unspecified lower extremity: Secondary | ICD-10-CM

## 2010-06-19 DIAGNOSIS — I2699 Other pulmonary embolism without acute cor pulmonale: Secondary | ICD-10-CM

## 2010-06-19 NOTE — Telephone Encounter (Signed)
Pt requesting a prescription for a dental premed be called to [facility].  Pt has dental appointment/work scheduled in 3 weeks.  Please call pt when rx sent to pharmacy.  Pt is also complaining of persistent swelling, stiffness and pain in R calf since DVT.  Pt wants to know if there is anything he can or should be doing to help with this.  BP was also checked at [facility] as requested by Jackie BP 132/78. EWJ

## 2010-07-03 ENCOUNTER — Telehealth: Payer: Self-pay | Admitting: Internal Medicine

## 2010-07-04 ENCOUNTER — Telehealth: Payer: Self-pay | Admitting: *Deleted

## 2010-07-04 NOTE — Telephone Encounter (Signed)
Patient called for premed before dental work.  There is no indication for this. In regards to pain from LE I would recomm elevating and using compression socks.  I would not prescribe pain meds.  He has had this chronically.

## 2010-07-04 NOTE — Telephone Encounter (Signed)
Called patient back and advised per Dr.Ross that he does not need SBE. I also discussed with him the need for support hose that he states he already uses but does does not elevate his leg when sitting down. I advised him to elevate the leg as much as possible when he is sitting down to help decrease the lower extremity swelling.

## 2010-07-11 NOTE — Op Note (Signed)
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   PROCEDURE:  Prostate brachytherapy and cysto.   ANESTHESIA:  General.   SURGEON:  Courtney Paris, M.D. and Maryln Gottron, M.D.   BRIEF HISTORY:  This 70 year old black male has a clinical T2B Gleason 3  plus 3 adenocarcinoma of the prostate.  His PSA had risen on a routine  physical to 4.3, biopsy showed cancer.  He has had a history of DVT of  his right leg in 2004 but has done well in the last 5 years.  He comes  in for definitive brachytherapy treatment.  He has a 50 gram prostate.   PROCEDURE IN DETAIL:  The patient was placed on the operating table in  dorsal lithotomy position and after satisfactory induction of general  endotracheal anesthesia he was prepped and draped with Betadine in the  usual sterile fashion.  He was given IV Cipro.  A time-out was performed  and the patient and procedure were reidentified and reconfirmed.  A  rectal tube, a Foley catheter and the 7.5 MHz prostate ultrasound were  placed in the rectum and the treatment was then planned by Dr. Chipper Herb.  After this was done I proceeded to implant the seeds.  A total  of 86 seeds with 34 needles were implanted with good dosimetry from the  planning.  This was done with ultrasonic guidance.  When this was done  the x-ray picture showed a nice distribution of seeds.   The cysto was performed after reprepping and draping with a flexible  cystoscope.  The anterior urethra looked normal.   Prostate was trilobar  configuration and the bladder was entered.  Bladder was 2+ trabeculated  but had no bladder mucosal lesions nor were there any seeds present in  the bladder lumen.  Scope was then removed and a #16 Foley catheter  inserted and left to straight drainage.  The patient was taken to the  recovery room in good condition and will be later discharged as an  outpatient.  We plan to put him on some Flomax and Cipro and have him  take out the catheter in 5 days and return in 3 weeks for followup.      Courtney Paris, M.D.  Electronically Signed     HMK/MEDQ  D:  02/12/2008  T:  02/13/2008  Job:  [id number]

## 2010-07-17 ENCOUNTER — Ambulatory Visit (INDEPENDENT_AMBULATORY_CARE_PROVIDER_SITE_OTHER): Payer: Medicare Other | Admitting: *Deleted

## 2010-07-17 DIAGNOSIS — I2699 Other pulmonary embolism without acute cor pulmonale: Secondary | ICD-10-CM

## 2010-07-17 DIAGNOSIS — I82409 Acute embolism and thrombosis of unspecified deep veins of unspecified lower extremity: Secondary | ICD-10-CM

## 2010-07-17 LAB — POCT INR: INR: 2.8

## 2010-07-31 NOTE — Telephone Encounter (Signed)
Information in this phone note was addressed in a later phone message on 07/03/10.

## 2010-08-14 ENCOUNTER — Ambulatory Visit (INDEPENDENT_AMBULATORY_CARE_PROVIDER_SITE_OTHER): Payer: Medicare Other | Admitting: *Deleted

## 2010-08-14 DIAGNOSIS — I2699 Other pulmonary embolism without acute cor pulmonale: Secondary | ICD-10-CM

## 2010-08-14 DIAGNOSIS — I82409 Acute embolism and thrombosis of unspecified deep veins of unspecified lower extremity: Secondary | ICD-10-CM

## 2010-09-11 ENCOUNTER — Ambulatory Visit (INDEPENDENT_AMBULATORY_CARE_PROVIDER_SITE_OTHER): Payer: Medicare Other | Admitting: *Deleted

## 2010-09-11 DIAGNOSIS — I2699 Other pulmonary embolism without acute cor pulmonale: Secondary | ICD-10-CM

## 2010-09-11 DIAGNOSIS — I82409 Acute embolism and thrombosis of unspecified deep veins of unspecified lower extremity: Secondary | ICD-10-CM

## 2010-10-01 ENCOUNTER — Other Ambulatory Visit: Payer: Self-pay | Admitting: Internal Medicine

## 2010-10-09 ENCOUNTER — Ambulatory Visit (INDEPENDENT_AMBULATORY_CARE_PROVIDER_SITE_OTHER): Payer: Medicare Other | Admitting: *Deleted

## 2010-10-09 DIAGNOSIS — I2699 Other pulmonary embolism without acute cor pulmonale: Secondary | ICD-10-CM

## 2010-10-09 DIAGNOSIS — I82409 Acute embolism and thrombosis of unspecified deep veins of unspecified lower extremity: Secondary | ICD-10-CM

## 2010-11-02 ENCOUNTER — Telehealth: Payer: Self-pay

## 2010-11-02 DIAGNOSIS — Z0000 Encounter for general adult medical examination without abnormal findings: Secondary | ICD-10-CM

## 2010-11-02 DIAGNOSIS — Z1289 Encounter for screening for malignant neoplasm of other sites: Secondary | ICD-10-CM

## 2010-11-02 NOTE — Telephone Encounter (Signed)
Put order in for lab work.

## 2010-11-03 ENCOUNTER — Encounter: Payer: Self-pay | Admitting: Internal Medicine

## 2010-11-03 ENCOUNTER — Other Ambulatory Visit: Payer: Medicare Other

## 2010-11-03 DIAGNOSIS — Z0001 Encounter for general adult medical examination with abnormal findings: Secondary | ICD-10-CM | POA: Insufficient documentation

## 2010-11-03 DIAGNOSIS — R7302 Impaired glucose tolerance (oral): Secondary | ICD-10-CM

## 2010-11-03 DIAGNOSIS — Z0000 Encounter for general adult medical examination without abnormal findings: Secondary | ICD-10-CM

## 2010-11-03 HISTORY — DX: Impaired glucose tolerance (oral): R73.02

## 2010-11-06 ENCOUNTER — Other Ambulatory Visit (INDEPENDENT_AMBULATORY_CARE_PROVIDER_SITE_OTHER): Payer: Medicare Other

## 2010-11-06 ENCOUNTER — Ambulatory Visit (INDEPENDENT_AMBULATORY_CARE_PROVIDER_SITE_OTHER): Payer: Medicare Other | Admitting: *Deleted

## 2010-11-06 ENCOUNTER — Ambulatory Visit (INDEPENDENT_AMBULATORY_CARE_PROVIDER_SITE_OTHER): Payer: Medicare Other | Admitting: Internal Medicine

## 2010-11-06 ENCOUNTER — Encounter: Payer: Self-pay | Admitting: Internal Medicine

## 2010-11-06 VITALS — BP 120/82 | HR 70 | Temp 97.8°F | Ht 69.0 in | Wt 204.5 lb

## 2010-11-06 DIAGNOSIS — Z0000 Encounter for general adult medical examination without abnormal findings: Secondary | ICD-10-CM

## 2010-11-06 DIAGNOSIS — Z1289 Encounter for screening for malignant neoplasm of other sites: Secondary | ICD-10-CM

## 2010-11-06 DIAGNOSIS — Z23 Encounter for immunization: Secondary | ICD-10-CM

## 2010-11-06 DIAGNOSIS — E785 Hyperlipidemia, unspecified: Secondary | ICD-10-CM

## 2010-11-06 DIAGNOSIS — I82409 Acute embolism and thrombosis of unspecified deep veins of unspecified lower extremity: Secondary | ICD-10-CM

## 2010-11-06 DIAGNOSIS — I2699 Other pulmonary embolism without acute cor pulmonale: Secondary | ICD-10-CM

## 2010-11-06 DIAGNOSIS — I10 Essential (primary) hypertension: Secondary | ICD-10-CM

## 2010-11-06 DIAGNOSIS — R7309 Other abnormal glucose: Secondary | ICD-10-CM

## 2010-11-06 DIAGNOSIS — Z125 Encounter for screening for malignant neoplasm of prostate: Secondary | ICD-10-CM

## 2010-11-06 LAB — URINALYSIS, ROUTINE W REFLEX MICROSCOPIC
Hgb urine dipstick: NEGATIVE
Nitrite: NEGATIVE
Specific Gravity, Urine: 1.01 (ref 1.000–1.030)
Total Protein, Urine: NEGATIVE
Urobilinogen, UA: 0.2 (ref 0.0–1.0)

## 2010-11-06 LAB — CBC WITH DIFFERENTIAL/PLATELET
Basophils Absolute: 0 10*3/uL (ref 0.0–0.1)
Eosinophils Absolute: 0.2 10*3/uL (ref 0.0–0.7)
HCT: 51.7 % (ref 39.0–52.0)
Hemoglobin: 16.6 g/dL (ref 13.0–17.0)
Lymphs Abs: 2.8 10*3/uL (ref 0.7–4.0)
MCHC: 32.1 g/dL (ref 30.0–36.0)
MCV: 89 fl (ref 78.0–100.0)
Monocytes Absolute: 0.6 10*3/uL (ref 0.1–1.0)
Neutro Abs: 3.3 10*3/uL (ref 1.4–7.7)
Platelets: 267 10*3/uL (ref 150.0–400.0)
RDW: 13.6 % (ref 11.5–14.6)

## 2010-11-06 LAB — BASIC METABOLIC PANEL
CO2: 30 mEq/L (ref 19–32)
Calcium: 9.4 mg/dL (ref 8.4–10.5)
Creatinine, Ser: 1.1 mg/dL (ref 0.4–1.5)
GFR: 88.58 mL/min (ref >60.00)
Sodium: 139 mEq/L (ref 135–145)

## 2010-11-06 LAB — HEPATIC FUNCTION PANEL
Bilirubin, Direct: 0.1 mg/dL (ref 0.0–0.3)
Total Protein: 7.5 g/dL (ref 6.0–8.3)

## 2010-11-06 LAB — LIPID PANEL
Total CHOL/HDL Ratio: 4
Triglycerides: 104 mg/dL (ref 0.0–149.0)

## 2010-11-06 LAB — PSA: PSA: 0.12 ng/mL (ref 0.10–4.00)

## 2010-11-06 NOTE — Progress Notes (Signed)
Quick Note:  Voice message left on [employer] system - lab is negative, normal or otherwise stable, pt to continue same tx ______ 

## 2010-11-06 NOTE — Progress Notes (Signed)
Subjective:    Patient ID: Kevin Hale, male    DOB: March 30, 1940, 70 y.o.   MRN: [id number]  HPI  Here for wellness and f/u;  Overall doing ok;  Pt denies CP, worsening SOB, DOE, wheezing, orthopnea, PND, worsening LE edema, palpitations, dizziness or syncope, but did have a trace of lightheadedness daily for 2 wks, now resolved. Due to see Dr Ross/cardiology soon.  Pt denies neurological change such as new Headache, facial or extremity weakness.  Pt denies polydipsia, polyuria, or low sugar symptoms. Pt states overall good compliance with treatment and medications, good tolerability, and trying to follow lower cholesterol diet.  Pt denies worsening depressive symptoms, suicidal ideation or panic. No fever, wt loss, night sweats, loss of appetite, or other constitutional symptoms.  Pt states good ability with ADL's, low fall risk, home safety reviewed and adequate, no significant changes in hearing or vision, and occasionally active with exercise.  [facility] urology, cards[facility]  No overt bleeding or bruising Past Medical History  Diagnosis Date  . History of DVT (deep vein thrombosis)   . History of colon polyps   . BPH (benign prostatic hyperplasia)   . Hypercholesteremia   . HTN (hypertension)   . Diverticulosis of colon   . DJD (degenerative joint disease)     left food  . Cancer of prostate   . Plantar fasciitis   . Glucose intolerance (impaired glucose tolerance)   . Anxiety   . Impaired glucose tolerance 11/03/2010   Past Surgical History  Procedure Date  . Uvulectomy   . Insertion prostate radiation seed     reports that he has been smoking.  He does not have any smokeless tobacco history on file. He reports that he drinks alcohol. He reports that he does not use illicit drugs. family history includes Deep vein thrombosis in his mother. No Known Allergies Current Outpatient Prescriptions on File Prior to Visit  Medication Sig Dispense Refill  .  allopurinol (ZYLOPRIM) 100 MG tablet Take 100 mg by mouth as needed.       Marland Kitchen lisinopril (PRINIVIL,ZESTRIL) 5 MG tablet Take 1 tablet (5 mg total) by mouth daily.  30 tablet  11  . warfarin (COUMADIN) 5 MG tablet TAKE ONE TABLET BY MOUTH EVERY DAY OR  AS DIRECTED BY ANTICOAGULATION CLINIC  45 tablet  2   Review of Systems Review of Systems  Constitutional: Negative for diaphoresis, activity change, appetite change and unexpected weight change.  HENT: Negative for hearing loss, ear pain, facial swelling, mouth sores and neck stiffness.   Eyes: Negative for pain, redness and visual disturbance.  Respiratory: Negative for shortness of breath and wheezing.   Cardiovascular: Negative for chest pain and palpitations.  Gastrointestinal: Negative for diarrhea, blood in stool, abdominal distention and rectal pain.  Genitourinary: Negative for hematuria, flank pain and decreased urine volume.  Musculoskeletal: Negative for myalgias and joint swelling.  Skin: Negative for color change and wound.  Neurological: Negative for syncope and numbness.  Hematological: Negative for adenopathy.  Psychiatric/Behavioral: Negative for hallucinations, self-injury, decreased concentration and agitation.      Objective:   Physical Exam BP 120/82  Pulse 70  Temp(Src) 97.8 F (36.6 C) (Oral)  Ht 5\' 9"  (1.753 m)  Wt 204 lb 8 oz (92.761 kg)  BMI 30.20 kg/m2  SpO2 97% Physical Exam  VS noted Constitutional: Pt is oriented to person, place, and time. Appears well-developed and well-nourished.  HENT:  Head: Normocephalic and  atraumatic.  Right Ear: External ear normal.  Left Ear: External ear normal.  Nose: Nose normal.  Mouth/Throat: Oropharynx is clear and moist.  Eyes: Conjunctivae and EOM are normal. Pupils are equal, round, and reactive to light.  Neck: Normal range of motion. Neck supple. No JVD present. No tracheal deviation present.  Cardiovascular: Normal rate, regular rhythm, normal heart sounds and  intact distal pulses.   Pulmonary/Chest: Effort normal and breath sounds normal.  Abdominal: Soft. Bowel sounds are normal. There is no tenderness.  Musculoskeletal: Normal range of motion. Exhibits no edema.  Lymphadenopathy:  Has no cervical adenopathy.  Neurological: Pt is alert and oriented to person, place, and time. Pt has normal reflexes. No cranial nerve deficit.  Skin: Skin is warm and dry. No rash noted.  Psychiatric:  Has  normal mood and affect. Behavior is normal.     Assessment & Plan:

## 2010-11-06 NOTE — Assessment & Plan Note (Signed)
.  Overall doing well, age appropriate education and counseling updated, referrals for preventative services and immunizations addressed, dietary and smoking counseling addressed, most recent labs and ECG reviewed.  I have personally reviewed and have noted: 1) the patient's medical and social history 2) The pt's use of alcohol, tobacco, and illicit drugs 3) The patient's current medications and supplements 4) Functional ability including ADL's, fall risk, home safety risk, hearing and visual impairment 5) Diet and physical activities 6) Evidence for depression or mood disorder 7) The patient's height, weight, and BMI have been recorded in the chart I have made referrals, and provided counseling and education based on review of the above For flu shot, declines tetanus for now

## 2010-11-06 NOTE — Patient Instructions (Addendum)
Please call the phone number [phone] (the [employer] System) for results of testing in 2-3 days;  When calling, simply dial the number, and when prompted enter the MRN number above (the Medical Record Number) and the # key, then the message should start. You had the flu shot Continue all other medications as before Please keep your appointments with your specialists as you have planned - cardiology, [facility] clinic, [facility] Please return in 1 year for your yearly visit, or sooner if needed, with Lab testing done 3-5 days before

## 2010-12-01 LAB — CBC
Hemoglobin: 16.4 g/dL (ref 13.0–17.0)
MCHC: 32.7 g/dL (ref 30.0–36.0)
MCV: 87.9 fL (ref 78.0–100.0)
RBC: 5.71 MIL/uL (ref 4.22–5.81)
WBC: 7.4 10*3/uL (ref 4.0–10.5)

## 2010-12-01 LAB — COMPREHENSIVE METABOLIC PANEL
Albumin: 3.7 g/dL (ref 3.5–5.2)
BUN: 12 mg/dL (ref 6–23)
CO2: 29 mEq/L (ref 19–32)
Chloride: 106 mEq/L (ref 96–112)
Creatinine, Ser: 1.09 mg/dL (ref 0.4–1.5)
GFR calc non Af Amer: >60 mL/min (ref >60)
Total Bilirubin: 0.7 mg/dL (ref 0.3–1.2)

## 2010-12-01 LAB — PROTIME-INR: INR: 1.2 (ref 0.00–1.49)

## 2010-12-01 LAB — APTT: aPTT: 29 seconds (ref 24–37)

## 2010-12-04 ENCOUNTER — Encounter: Payer: Self-pay | Admitting: Internal Medicine

## 2010-12-04 ENCOUNTER — Ambulatory Visit (INDEPENDENT_AMBULATORY_CARE_PROVIDER_SITE_OTHER): Payer: Medicare Other | Admitting: *Deleted

## 2010-12-04 ENCOUNTER — Ambulatory Visit (INDEPENDENT_AMBULATORY_CARE_PROVIDER_SITE_OTHER): Payer: Medicare Other | Admitting: Internal Medicine

## 2010-12-04 DIAGNOSIS — I2699 Other pulmonary embolism without acute cor pulmonale: Secondary | ICD-10-CM

## 2010-12-04 DIAGNOSIS — I10 Essential (primary) hypertension: Secondary | ICD-10-CM

## 2010-12-04 DIAGNOSIS — E7800 Pure hypercholesterolemia, unspecified: Secondary | ICD-10-CM

## 2010-12-04 DIAGNOSIS — I82409 Acute embolism and thrombosis of unspecified deep veins of unspecified lower extremity: Secondary | ICD-10-CM

## 2010-12-04 NOTE — Assessment & Plan Note (Signed)
Continue to watch diet.  Exercise.

## 2010-12-04 NOTE — Assessment & Plan Note (Signed)
Continue coumadin from now on.  F/u in 1 year.

## 2010-12-04 NOTE — Progress Notes (Signed)
HPI Patient is a 70 year old with a history of Chronic LE DVT.  I saw him earlier this year for SOB and hypotension.  W/U signif for PE. Echo showed normal RV and LV function. He is now on coumadin Since seen he says his breathing is better.  No CP.  Does get sob if walks for a long time but able to walk up stairs. Notes occasional dizziness with change of position.   Does note that on coumadin his RLE swelling has improved.  Not on File  Current Outpatient Prescriptions  Medication Sig Dispense Refill  . allopurinol (ZYLOPRIM) 100 MG tablet Take 100 mg by mouth daily.       Marland Kitchen lisinopril (PRINIVIL,ZESTRIL) 5 MG tablet Take 1 tablet (5 mg total) by mouth daily.  30 tablet  11  . warfarin (COUMADIN) 5 MG tablet TAKE ONE TABLET BY MOUTH EVERY DAY OR  AS DIRECTED BY ANTICOAGULATION CLINIC  45 tablet  2    Past Medical History  Diagnosis Date  . History of DVT (deep vein thrombosis)   . History of colon polyps   . BPH (benign prostatic hyperplasia)   . Hypercholesteremia   . HTN (hypertension)   . Diverticulosis of colon   . DJD (degenerative joint disease)     left food  . Cancer of prostate   . Plantar fasciitis   . Glucose intolerance (impaired glucose tolerance)   . Anxiety   . Impaired glucose tolerance 11/03/2010    Past Surgical History  Procedure Date  . Uvulectomy   . Insertion prostate radiation seed     Family History  Problem Relation Age of Onset  . Deep vein thrombosis Mother     History   Social History  . Marital Status: Married    Spouse Name: N/A    Number of Children: 2  . Years of Education: N/A   Occupational History  . retired    Social History Main Topics  . Smoking status: Passive Smoker  . Smokeless tobacco: Not on file  . Alcohol Use: Yes  . Drug Use: No  . Sexually Active: Not on file   Other Topics Concern  . Not on file   Social History Narrative  . No narrative on file    Review of Systems:  All systems reviewed.  They are  negative to the above problem except as previously stated.  Vital Signs: BP 139/71  Pulse 74  Ht 5\' 9"  (1.753 m)  Wt 206 lb (93.441 kg)  BMI 30.42 kg/m2  Physical Exam Patient is in NAD  HEENT:  Normocephalic, atraumatic. EOMI, PERRLA.  Neck: JVP is normal. No thyromegaly. No bruits.  Lungs: clear to auscultation. No rales no wheezes.  Heart: Regular rate and rhythm. Normal S1, S2. No S3.   No significant murmurs. PMI not displaced.  Abdomen:  Supple, nontender. Normal bowel sounds. No masses. No hepatomegaly.  Extremities:   Good distal pulses throughout. No lower extremity edema.  Musculoskeletal :moving all extremities.  Neuro:   alert and oriented x3.  CN II-XII grossly intact.   Assessment and Plan:

## 2010-12-04 NOTE — Assessment & Plan Note (Signed)
Adequate control. 

## 2011-01-01 ENCOUNTER — Encounter: Payer: Medicare Other | Admitting: *Deleted

## 2011-01-02 ENCOUNTER — Ambulatory Visit (INDEPENDENT_AMBULATORY_CARE_PROVIDER_SITE_OTHER): Payer: Medicare Other | Admitting: *Deleted

## 2011-01-02 DIAGNOSIS — I82409 Acute embolism and thrombosis of unspecified deep veins of unspecified lower extremity: Secondary | ICD-10-CM

## 2011-01-02 DIAGNOSIS — I2699 Other pulmonary embolism without acute cor pulmonale: Secondary | ICD-10-CM

## 2011-01-11 ENCOUNTER — Other Ambulatory Visit: Payer: Self-pay | Admitting: Internal Medicine

## 2011-01-29 ENCOUNTER — Encounter: Payer: Self-pay | Admitting: Internal Medicine

## 2011-01-29 ENCOUNTER — Ambulatory Visit (INDEPENDENT_AMBULATORY_CARE_PROVIDER_SITE_OTHER): Payer: Medicare Other | Admitting: Internal Medicine

## 2011-01-29 VITALS — BP 120/78 | HR 66 | Temp 98.3°F | Ht 69.0 in | Wt 207.4 lb

## 2011-01-29 DIAGNOSIS — R062 Wheezing: Secondary | ICD-10-CM | POA: Insufficient documentation

## 2011-01-29 DIAGNOSIS — R7302 Impaired glucose tolerance (oral): Secondary | ICD-10-CM

## 2011-01-29 DIAGNOSIS — I10 Essential (primary) hypertension: Secondary | ICD-10-CM

## 2011-01-29 DIAGNOSIS — J209 Acute bronchitis, unspecified: Secondary | ICD-10-CM

## 2011-01-29 DIAGNOSIS — R7309 Other abnormal glucose: Secondary | ICD-10-CM

## 2011-01-29 MED ORDER — HYDROCODONE-HOMATROPINE 5-1.5 MG/5ML PO SYRP: 5.0000 mL | ORAL_SOLUTION | Freq: Four times a day (QID) | ORAL | Status: AC | PRN

## 2011-01-29 MED ORDER — LEVOFLOXACIN 250 MG PO TABS: 250.0000 mg | ORAL_TABLET | Freq: Every day | ORAL | Status: AC

## 2011-01-29 MED ORDER — PREDNISONE 10 MG PO TABS: 10.0000 mg | ORAL_TABLET | Freq: Every day | ORAL | Status: AC

## 2011-01-29 NOTE — Assessment & Plan Note (Signed)
Mild , for low dose short course predpack,  to f/u any worsening symptoms or concerns 

## 2011-01-29 NOTE — Assessment & Plan Note (Signed)
Mild to mod, for antibx course,  to f/u any worsening symptoms or concerns 

## 2011-01-29 NOTE — Patient Instructions (Addendum)
Take all new medications as prescribed Continue all other medications as before  

## 2011-01-29 NOTE — Assessment & Plan Note (Signed)
stable overall by hx and exam, most recent data reviewed with pt, and pt to continue medical treatment as before le  Lab Results  Component Value Date   HGBA1C 5.7 11/06/2010

## 2011-01-29 NOTE — Progress Notes (Signed)
  Subjective:    Patient ID: Kevin Hale, male    DOB: November 25, 1940, 70 y.o.   MRN: [id number]  HPI  Here with acute onset mild to mod 2-3 days ST, HA, general weakness and malaise, with prod cough greenish sputum, but Pt denies chest pain, increased sob or doe, wheezing, orthopnea, PND, increased LE swelling, palpitations, dizziness or syncope, except for mild wheezing/sob this am.  Pt denies new neurological symptoms such as new headache, or facial or extremity weakness or numbness   Pt denies polydipsia, polyuria.  Pt states overall good compliance with meds, trying to follow lower cholesterol, diabetic diet, wt overall stable but little exercise however.    Pt denies fever, wt loss, night sweats, loss of appetite, or other constitutional symptoms until the past 2 days. Past Medical History  Diagnosis Date  . History of DVT (deep vein thrombosis)   . History of colon polyps   . BPH (benign prostatic hyperplasia)   . Hypercholesteremia   . HTN (hypertension)   . Diverticulosis of colon   . DJD (degenerative joint disease)     left food  . Cancer of prostate   . Plantar fasciitis   . Glucose intolerance (impaired glucose tolerance)   . Anxiety   . Impaired glucose tolerance 11/03/2010   Past Surgical History  Procedure Date  . Uvulectomy   . Insertion prostate radiation seed     reports that he has been passively smoking.  He does not have any smokeless tobacco history on file. He reports that he drinks alcohol. He reports that he does not use illicit drugs. family history includes Deep vein thrombosis in his mother. No Known Allergies Current Outpatient Prescriptions on File Prior to Visit  Medication Sig Dispense Refill  . allopurinol (ZYLOPRIM) 100 MG tablet Take 100 mg by mouth daily.       Marland Kitchen lisinopril (PRINIVIL,ZESTRIL) 5 MG tablet Take 1 tablet (5 mg total) by mouth daily.  30 tablet  11  . warfarin (COUMADIN) 5 MG tablet Take 1 tablet (5 mg total) by mouth as directed.  45  tablet  3   Review of Systems Review of Systems  Constitutional: Negative for diaphoresis and unexpected weight change.  HENT: Negative for drooling and tinnitus.   Eyes: Negative for photophobia and visual disturbance.  Respiratory: Negative for choking and stridor.   Gastrointestinal: Negative for vomiting and blood in stool.  Genitourinary: Negative for hematuria and decreased urine volume.    Objective:   Physical Exam BP 120/78  Pulse 66  Temp(Src) 98.3 F (36.8 C) (Oral)  Ht 5\' 9"  (1.753 m)  Wt 207 lb 6 oz (94.065 kg)  BMI 30.62 kg/m2  SpO2 97% Physical Exam  VS noted, mild ill Constitutional: Pt appears well-developed and well-nourished.  HENT: Head: Normocephalic.  Right Ear: External ear normal.  Left Ear: External ear normal.  Bilat tm's mild erythema.  Sinus nontender.  Pharynx mild erythema Eyes: Conjunctivae and EOM are normal. Pupils are equal, round, and reactive to light.  Neck: Normal range of motion. Neck supple.  Cardiovascular: Normal rate and regular rhythm.   Pulmonary/Chest: Effort normal and breath sounds decreased mild with mild wheeze, no rales  Neurological: Pt is alert. No cranial nerve deficit.  Skin: Skin is warm. No erythema.  Psychiatric: Pt behavior is normal. Thought content normal.     Assessment & Plan:

## 2011-01-29 NOTE — Assessment & Plan Note (Signed)
stable overall by hx and exam, most recent data reviewed with pt, and pt to continue medical treatment as before  BP Readings from Last 3 Encounters:  01/29/11 120/78  12/04/10 139/71  11/06/10 120/82

## 2011-01-30 ENCOUNTER — Ambulatory Visit (INDEPENDENT_AMBULATORY_CARE_PROVIDER_SITE_OTHER): Payer: Medicare Other | Admitting: *Deleted

## 2011-01-30 DIAGNOSIS — I2699 Other pulmonary embolism without acute cor pulmonale: Secondary | ICD-10-CM

## 2011-01-30 DIAGNOSIS — I82409 Acute embolism and thrombosis of unspecified deep veins of unspecified lower extremity: Secondary | ICD-10-CM

## 2011-02-28 ENCOUNTER — Ambulatory Visit (INDEPENDENT_AMBULATORY_CARE_PROVIDER_SITE_OTHER): Payer: Medicare Other | Admitting: *Deleted

## 2011-02-28 DIAGNOSIS — I2699 Other pulmonary embolism without acute cor pulmonale: Secondary | ICD-10-CM

## 2011-02-28 DIAGNOSIS — I82409 Acute embolism and thrombosis of unspecified deep veins of unspecified lower extremity: Secondary | ICD-10-CM

## 2011-04-04 ENCOUNTER — Ambulatory Visit (INDEPENDENT_AMBULATORY_CARE_PROVIDER_SITE_OTHER): Payer: Medicare Other | Admitting: *Deleted

## 2011-04-04 DIAGNOSIS — Z7901 Long term (current) use of anticoagulants: Secondary | ICD-10-CM

## 2011-04-04 DIAGNOSIS — I2699 Other pulmonary embolism without acute cor pulmonale: Secondary | ICD-10-CM

## 2011-04-04 DIAGNOSIS — I82409 Acute embolism and thrombosis of unspecified deep veins of unspecified lower extremity: Secondary | ICD-10-CM

## 2011-04-07 ENCOUNTER — Other Ambulatory Visit: Payer: Self-pay | Admitting: Internal Medicine

## 2011-05-02 ENCOUNTER — Ambulatory Visit (INDEPENDENT_AMBULATORY_CARE_PROVIDER_SITE_OTHER): Payer: Medicare Other | Admitting: *Deleted

## 2011-05-02 DIAGNOSIS — Z7901 Long term (current) use of anticoagulants: Secondary | ICD-10-CM

## 2011-05-02 DIAGNOSIS — I82409 Acute embolism and thrombosis of unspecified deep veins of unspecified lower extremity: Secondary | ICD-10-CM

## 2011-05-02 DIAGNOSIS — I2699 Other pulmonary embolism without acute cor pulmonale: Secondary | ICD-10-CM

## 2011-05-16 ENCOUNTER — Other Ambulatory Visit: Payer: Self-pay | Admitting: Internal Medicine

## 2011-05-30 ENCOUNTER — Ambulatory Visit (INDEPENDENT_AMBULATORY_CARE_PROVIDER_SITE_OTHER): Payer: Medicare Other | Admitting: *Deleted

## 2011-05-30 DIAGNOSIS — Z7901 Long term (current) use of anticoagulants: Secondary | ICD-10-CM

## 2011-05-30 DIAGNOSIS — I82409 Acute embolism and thrombosis of unspecified deep veins of unspecified lower extremity: Secondary | ICD-10-CM

## 2011-05-30 DIAGNOSIS — I2699 Other pulmonary embolism without acute cor pulmonale: Secondary | ICD-10-CM

## 2011-05-30 LAB — POCT INR: INR: 2.5

## 2011-06-04 ENCOUNTER — Other Ambulatory Visit: Payer: Self-pay | Admitting: Internal Medicine

## 2011-06-27 ENCOUNTER — Ambulatory Visit (INDEPENDENT_AMBULATORY_CARE_PROVIDER_SITE_OTHER): Payer: Medicare Other | Admitting: Pharmacist

## 2011-06-27 DIAGNOSIS — I82409 Acute embolism and thrombosis of unspecified deep veins of unspecified lower extremity: Secondary | ICD-10-CM

## 2011-06-27 DIAGNOSIS — I2699 Other pulmonary embolism without acute cor pulmonale: Secondary | ICD-10-CM

## 2011-06-27 DIAGNOSIS — Z7901 Long term (current) use of anticoagulants: Secondary | ICD-10-CM

## 2011-06-27 LAB — POCT INR: INR: 2.5

## 2011-07-19 ENCOUNTER — Emergency Department (HOSPITAL_BASED_OUTPATIENT_CLINIC_OR_DEPARTMENT_OTHER)
Admission: EM | Admit: 2011-07-19 | Discharge: 2011-07-19 | Disposition: A | Discharge status: Home / Self Care | Payer: Medicare Other | Attending: Emergency Medicine | Admitting: Emergency Medicine

## 2011-07-19 ENCOUNTER — Emergency Department (HOSPITAL_BASED_OUTPATIENT_CLINIC_OR_DEPARTMENT_OTHER): Payer: Medicare Other

## 2011-07-19 ENCOUNTER — Encounter: Payer: Self-pay | Admitting: Internal Medicine

## 2011-07-19 ENCOUNTER — Ambulatory Visit (INDEPENDENT_AMBULATORY_CARE_PROVIDER_SITE_OTHER): Payer: Medicare Other | Admitting: Internal Medicine

## 2011-07-19 ENCOUNTER — Encounter (HOSPITAL_BASED_OUTPATIENT_CLINIC_OR_DEPARTMENT_OTHER): Payer: Self-pay

## 2011-07-19 VITALS — BP 140/70 | HR 82 | Temp 98.4°F | Resp 16 | Wt 198.5 lb

## 2011-07-19 DIAGNOSIS — E7800 Pure hypercholesterolemia, unspecified: Secondary | ICD-10-CM | POA: Insufficient documentation

## 2011-07-19 DIAGNOSIS — Z86718 Personal history of other venous thrombosis and embolism: Secondary | ICD-10-CM | POA: Insufficient documentation

## 2011-07-19 DIAGNOSIS — R109 Unspecified abdominal pain: Secondary | ICD-10-CM | POA: Insufficient documentation

## 2011-07-19 DIAGNOSIS — R51 Headache: Secondary | ICD-10-CM | POA: Insufficient documentation

## 2011-07-19 DIAGNOSIS — I10 Essential (primary) hypertension: Secondary | ICD-10-CM | POA: Insufficient documentation

## 2011-07-19 DIAGNOSIS — Q619 Cystic kidney disease, unspecified: Secondary | ICD-10-CM | POA: Insufficient documentation

## 2011-07-19 DIAGNOSIS — N281 Cyst of kidney, acquired: Secondary | ICD-10-CM

## 2011-07-19 DIAGNOSIS — Z8546 Personal history of malignant neoplasm of prostate: Secondary | ICD-10-CM | POA: Insufficient documentation

## 2011-07-19 DIAGNOSIS — Z79899 Other long term (current) drug therapy: Secondary | ICD-10-CM | POA: Insufficient documentation

## 2011-07-19 DIAGNOSIS — J019 Acute sinusitis, unspecified: Secondary | ICD-10-CM

## 2011-07-19 DIAGNOSIS — J3489 Other specified disorders of nose and nasal sinuses: Secondary | ICD-10-CM | POA: Insufficient documentation

## 2011-07-19 DIAGNOSIS — R350 Frequency of micturition: Secondary | ICD-10-CM | POA: Insufficient documentation

## 2011-07-19 LAB — DIFFERENTIAL
Basophils Relative: 0 % (ref 0–1)
Lymphs Abs: 2.5 10*3/uL (ref 0.7–4.0)
Monocytes Relative: 9 % (ref 3–12)
Neutro Abs: 10.3 10*3/uL — ABNORMAL HIGH (ref 1.7–7.7)
Neutrophils Relative %: 73 % (ref 43–77)

## 2011-07-19 LAB — BASIC METABOLIC PANEL
CO2: 25 mEq/L (ref 19–32)
Calcium: 9.8 mg/dL (ref 8.4–10.5)
Chloride: 102 mEq/L (ref 96–112)
GFR calc Af Amer: 85 mL/min — ABNORMAL LOW (ref >90)
Sodium: 136 mEq/L (ref 135–145)

## 2011-07-19 LAB — CBC
Hemoglobin: 16 g/dL (ref 13.0–17.0)
RBC: 5.61 MIL/uL (ref 4.22–5.81)

## 2011-07-19 LAB — URINALYSIS, ROUTINE W REFLEX MICROSCOPIC
Glucose, UA: NEGATIVE mg/dL
Leukocytes, UA: NEGATIVE
Nitrite: NEGATIVE
Protein, ur: NEGATIVE mg/dL

## 2011-07-19 LAB — PROTIME-INR: INR: 1.96 — ABNORMAL HIGH (ref 0.00–1.49)

## 2011-07-19 MED ORDER — FLUTICASONE PROPIONATE 50 MCG/ACT NA SUSP: 2.0000 | Freq: Every day | NASAL | Status: DC

## 2011-07-19 MED ORDER — SODIUM CHLORIDE 0.9 % IV SOLN
3.0000 g | Freq: Once | INTRAVENOUS | Status: AC
  Administered 2011-07-19: 3 g via INTRAVENOUS
  Filled 2011-07-19: qty 3

## 2011-07-19 MED ORDER — TRAMADOL HCL 50 MG PO TABS
50.0000 mg | ORAL_TABLET | Freq: Once | ORAL | Status: AC
  Administered 2011-07-19: 50 mg via ORAL
  Filled 2011-07-19: qty 1

## 2011-07-19 MED ORDER — TRAMADOL HCL 50 MG PO TABS: 50.0000 mg | ORAL_TABLET | Freq: Four times a day (QID) | ORAL | Status: AC | PRN

## 2011-07-19 MED ORDER — FENTANYL CITRATE 0.05 MG/ML IJ SOLN
50.0000 ug | Freq: Once | INTRAMUSCULAR | Status: AC
  Administered 2011-07-19: 50 ug via INTRAVENOUS
  Filled 2011-07-19: qty 2

## 2011-07-19 MED ORDER — AMOXICILLIN-POT CLAVULANATE 875-125 MG PO TABS: 1.0000 | ORAL_TABLET | Freq: Two times a day (BID) | ORAL | Status: AC

## 2011-07-19 NOTE — Progress Notes (Signed)
Subjective:    Patient ID: Kevin Hale, male    DOB: May 07, 1940, 71 y.o.   MRN: [id number]  Sinusitis This is a recurrent problem. The current episode started 1 to 4 weeks ago. The problem is unchanged. There has been no fever. His pain is at a severity of 4/10. The pain is moderate. Associated symptoms include congestion, coughing, sinus pressure and a sore throat. Pertinent negatives include no chills, diaphoresis, ear pain, headaches, hoarse voice, neck pain, shortness of breath, sneezing or swollen glands. Treatments tried: he started augmentin earlier today. The treatment provided mild relief.      Review of Systems  Constitutional: Negative for fever, chills, diaphoresis, activity change, appetite change, fatigue and unexpected weight change.  HENT: Positive for congestion, sore throat, rhinorrhea, postnasal drip and sinus pressure. Negative for hearing loss, ear pain, nosebleeds, hoarse voice, facial swelling, sneezing, drooling, mouth sores, trouble swallowing, neck pain, neck stiffness, dental problem, voice change, tinnitus and ear discharge.   Eyes: Negative for photophobia, pain, discharge, redness, itching and visual disturbance.  Respiratory: Positive for cough. Negative for apnea, choking, chest tightness, shortness of breath, wheezing and stridor.   Cardiovascular: Negative for chest pain, palpitations and leg swelling.  Gastrointestinal: Negative.   Genitourinary: Negative.   Musculoskeletal: Negative.   Skin: Negative.   Neurological: Negative for dizziness, tremors, seizures, syncope, facial asymmetry, speech difficulty, weakness, light-headedness, numbness and headaches.  Hematological: Negative for adenopathy. Does not bruise/bleed easily.  Psychiatric/Behavioral: Negative.        Objective:   Physical Exam  Vitals reviewed. Constitutional: He is oriented to person, place, and time. Vital signs are normal. He appears well-developed and well-nourished.  Non-toxic  appearance. He does not have a sickly appearance. He does not appear ill. No distress.  HENT:  Head: Normocephalic and atraumatic. No trismus in the jaw.  Right Ear: Hearing, tympanic membrane, external ear and ear canal normal.  Left Ear: Hearing, tympanic membrane, external ear and ear canal normal.  Nose: No mucosal edema, rhinorrhea, nose lacerations, sinus tenderness, nasal deformity, septal deviation or nasal septal hematoma. No epistaxis.  No foreign bodies. Right sinus exhibits no maxillary sinus tenderness and no frontal sinus tenderness. Left sinus exhibits maxillary sinus tenderness. Left sinus exhibits no frontal sinus tenderness.  Mouth/Throat: Oropharynx is clear and moist and mucous membranes are normal. Mucous membranes are not pale, not dry and not cyanotic. No uvula swelling. No oropharyngeal exudate, posterior oropharyngeal edema, posterior oropharyngeal erythema or tonsillar abscesses.  Eyes: Conjunctivae are normal. Right eye exhibits no discharge. Left eye exhibits no discharge. No scleral icterus.  Neck: Normal range of motion. Neck supple. No JVD present. No tracheal deviation present. No thyromegaly present.  Cardiovascular: Normal rate, regular rhythm, normal heart sounds and intact distal pulses.  Exam reveals no gallop and no friction rub.   No murmur heard. Pulmonary/Chest: Effort normal and breath sounds normal. No stridor. No respiratory distress. He has no wheezes. He has no rales. He exhibits no tenderness.  Abdominal: Soft. Bowel sounds are normal. He exhibits no distension and no mass. There is no tenderness. There is no rebound and no guarding.  Musculoskeletal: Normal range of motion. He exhibits no edema and no tenderness.  Lymphadenopathy:    He has no cervical adenopathy.  Neurological: He is alert and oriented to person, place, and time.  Skin: Skin is warm and dry. No rash noted. He is not diaphoretic. No erythema. No pallor.  Psychiatric: He has a normal  mood and  affect. Judgment and thought content normal. His mood appears not anxious. His affect is not angry, not blunt, not labile and not inappropriate. His speech is tangential. His speech is not rapid and/or pressured, not delayed and not slurred. He is slowed. He is not agitated, not aggressive, is not hyperactive, not withdrawn, not actively hallucinating and not combative. Thought content is not paranoid and not delusional. Cognition and memory are impaired. He does not express impulsivity or inappropriate judgment. He does not exhibit a depressed mood. He expresses no homicidal and no suicidal ideation. He expresses no suicidal plans and no homicidal plans. He is communicative. He exhibits abnormal recent memory and abnormal remote memory. He is inattentive.      Lab Results  Component Value Date   WBC 14.1* 07/19/2011   HGB 16.0 07/19/2011   HCT 46.8 07/19/2011   PLT 328 07/19/2011   GLUCOSE 138* 07/19/2011   CHOL 174 11/06/2010   TRIG 104.0 11/06/2010   HDL 44.90 11/06/2010   LDLCALC 108* 11/06/2010   ALT 26 11/06/2010   AST 23 11/06/2010   NA 136 07/19/2011   K 4.1 07/19/2011   CL 102 07/19/2011   CREATININE 1.00 07/19/2011   BUN 12 07/19/2011   CO2 25 07/19/2011   TSH 1.13 11/06/2010   PSA 0.12 11/06/2010   INR 1.96* 07/19/2011   HGBA1C 5.7 11/06/2010  Ct Abdomen Pelvis Wo Contrast  07/19/2011  *RADIOLOGY REPORT*  Clinical Data: Left flank pain  CT ABDOMEN AND PELVIS WITHOUT CONTRAST  Technique:  Multidetector CT imaging of the abdomen and pelvis was performed following the standard protocol without intravenous contrast.  Comparison: None.  Findings: Limited images through the lung bases demonstrate no significant appreciable abnormality. The heart size is within normal limits. No pleural or pericardial effusion.  Intra-abdominal organ evaluation is limited in the absence of intravenous contrast.  Within this limitation, a couple nonspecific hypodensities within the liver, measuring up to 1 cm. No  biliary ductal dilatation or radiodense gallstones.  Unremarkable spleen, pancreas, adrenal glands.  1.7 cm simple cyst arising from the right kidney medially.  There are a couple too small further characterize hypodensities arising from the left kidney.  No hydronephrosis or hydroureter.  No urinary tract calculi.  Mild bilateral perinephric fat stranding is nonspecific.  No bowel obstruction.  No CT evidence for colitis.  Mild colonic diverticulosis.  Appendix not identified.  No right lower quadrant inflammation.  No lymphadenopathy.  Partially decompressed bladder.  Brachytherapy seeds within the prostate gland.  Small fat containing inguinal hernias.  There is scattered atherosclerotic calcification of the aorta and its branches. No aneurysmal dilatation.  Multilevel degenerative changes of the imaged spine. No acute or aggressive appearing osseous lesion.  No acute osseous abnormality.  IMPRESSION: No hydronephrosis or urinary tract calculi identified.  Mild bilateral perinephric fat stranding is nonspecific.  Correlate with urinalysis if concerned for acute infection.  Colonic diverticulosis.  No CT evidence for diverticulitis.  A couple too small to further characterize hypodensities within the liver and left kidney.  Original Report Authenticated By: Waneta Martins, M.D.   Dg Chest 2 View  07/19/2011  *RADIOLOGY REPORT*  Clinical Data: Cough, headache.  CHEST - 2 VIEW  Comparison: 03/09/2010 CT  Findings: Mild senescent changes/interstitial prominence without focal consolidation, pleural effusion, pneumothorax. Cardiomediastinal contours within normal limits.  Right upper lobe granuloma.  No acute osseous finding.  IMPRESSION: No radiographic evidence of acute cardiopulmonary process.  Original Report Authenticated By: Waneta Martins,  M.D.   Ct Head Wo Contrast  07/19/2011  *RADIOLOGY REPORT*  Clinical Data: Left eye pain, headache.  CT HEAD WITHOUT CONTRAST  Technique:  Contiguous axial  images were obtained from the base of the skull through the vertex without contrast.  Comparison: None.  Findings: Mild prominence of the sulci, cisterns, and ventricles, in keeping with volume loss. There are mild subcortical and periventricular white matter hypodensities, a nonspecific finding most often seen with chronic microangiopathic changes.  There is no evidence for acute hemorrhage, overt hydrocephalus, mass lesion, or abnormal extra-axial fluid collection.  No definite CT evidence for acute cortical based (large artery) infarction.  Partially opacified paranasal sinuses with left maxillary sinus air fluid level.  Mastoid air cells are clear.  Globes are symmetric.  Status post lens replacements.  No intraocular or retrobulbar hematoma.  IMPRESSION: No acute intracranial abnormality.  Paranasal sinus opacification may reflect acute sinusitis.  Symmetric globes, without CT evidence for ocular hemorrhage.  Original Report Authenticated By: Waneta Martins, M.D.      Assessment & Plan:

## 2011-07-19 NOTE — Discharge Instructions (Signed)

## 2011-07-19 NOTE — ED Notes (Signed)
Pt sts has head pain and left side of face and eye hurts. Pain comes and goes

## 2011-07-19 NOTE — Patient Instructions (Signed)

## 2011-07-19 NOTE — ED Provider Notes (Signed)
History     CSN: [id number]  Arrival date & time 07/19/11  4098   First MD Initiated Contact with Patient 07/19/11 [phone]      Chief Complaint  Patient presents with  . Headache    (Consider location/radiation/quality/duration/timing/severity/associated sxs/prior treatment) Patient is a 71 y.o. male presenting with headaches and flank pain. The history is provided by the patient. No language interpreter was used.  Headache  This is a new problem. The current episode started yesterday. The problem occurs constantly. The problem has not changed since onset.The headache is associated with nothing. The pain is located in the left unilateral (left face from orbit to jaw including the gums) region. The pain is at a severity of 7/10. The pain is severe. The pain does not radiate. Pertinent negatives include no anorexia, no fever, no malaise/fatigue, no chest pressure, no near-syncope, no orthopnea, no palpitations, no syncope and no shortness of breath. He has tried nothing for the symptoms. The treatment provided no relief.  Flank Pain This is a new problem. The current episode started yesterday. Episode frequency: episodically  The problem has not changed since onset.Associated symptoms include headaches. Pertinent negatives include no chest pain and no shortness of breath. The symptoms are aggravated by nothing. The symptoms are relieved by nothing. He has tried nothing for the symptoms. The treatment provided no relief.  Pain is really in the face from the orbit down on the left.  There is no weakness, numbness, changes in vision, no diplopia, no speech or swallowing abnormality.  There is nasal congestion.  No chest pain no SOB no n/v/d.  No DOE.  No vomiting nor diarrhea.  Pain was not sudden onset no tractional components.   Past Medical History  Diagnosis Date  . History of DVT (deep vein thrombosis)   . History of colon polyps   . BPH (benign prostatic hyperplasia)   . Hypercholesteremia     . HTN (hypertension)   . Diverticulosis of colon   . DJD (degenerative joint disease)     left food  . Cancer of prostate   . Plantar fasciitis   . Glucose intolerance (impaired glucose tolerance)   . Anxiety   . Impaired glucose tolerance 11/03/2010    Past Surgical History  Procedure Date  . Uvulectomy   . Insertion prostate radiation seed     Family History  Problem Relation Age of Onset  . Deep vein thrombosis Mother     History  Substance Use Topics  . Smoking status: Passive Smoker  . Smokeless tobacco: Not on file  . Alcohol Use: No      Review of Systems  Constitutional: Negative for fever and malaise/fatigue.  HENT: Positive for congestion and rhinorrhea. Negative for sore throat, facial swelling, trouble swallowing and neck stiffness.   Respiratory: Negative for shortness of breath.   Cardiovascular: Negative for chest pain, palpitations, orthopnea, leg swelling, syncope and near-syncope.  Gastrointestinal: Negative for anorexia.  Genitourinary: Positive for frequency and flank pain.  Neurological: Positive for headaches. Negative for dizziness, facial asymmetry and speech difficulty.  All other systems reviewed and are negative.    Allergies  Review of patient's allergies indicates no known allergies.  Home Medications   Current Outpatient Rx  Name Route Sig Dispense Refill  . ALLOPURINOL 100 MG PO TABS  TAKE ONE TABLET BY MOUTH ONCE DAILY. 90 tablet 3  . LISINOPRIL 5 MG PO TABS  TAKE ONE TABLET BY MOUTH EVERY DAY 30 tablet 11  .  WARFARIN SODIUM 5 MG PO TABS  Take as directed by Anticoagulation clinic.  Pt takes up to 1 1/2 tablets daily. 45 tablet 3    BP 158/75  Pulse 91  Temp(Src) 99.3 F (37.4 C) (Oral)  Resp 20  SpO2 97%  Physical Exam  Constitutional: He is oriented to person, place, and time. He appears well-developed and well-nourished. No distress.  HENT:  Head: Normocephalic and atraumatic.  Right Ear: Tympanic membrane is not  injected. No hemotympanum.  Left Ear: Tympanic membrane is not injected. No hemotympanum.  Mouth/Throat: Oropharynx is clear and moist. No oropharyngeal exudate.       No changes in vision.  No bulging temporal artery  Eyes: Conjunctivae and EOM are normal. Pupils are equal, round, and reactive to light.  Neck: Normal range of motion. Neck supple. No JVD present. No tracheal deviation present.  Cardiovascular: Normal rate and regular rhythm.   Pulmonary/Chest: Effort normal. He has rhonchi.  Abdominal: Soft. Bowel sounds are normal. There is no tenderness. There is no rebound and no guarding.  Musculoskeletal: Normal range of motion.  Neurological: He is alert and oriented to person, place, and time. He has normal reflexes. No cranial nerve deficit.  Skin: Skin is warm and dry.  Psychiatric: He has a normal mood and affect.    ED Course  Procedures (including critical care time)   Labs Reviewed  CBC  DIFFERENTIAL  PROTIME-INR  BASIC METABOLIC PANEL  URINALYSIS, ROUTINE W REFLEX MICROSCOPIC  URINE CULTURE  TROPONIN I   No results found.   No diagnosis found.    MDM   Date: 07/19/2011  Rate: 83  Rhythm: normal sinus rhythm  QRS Axis: left  Intervals: normal  ST/T Wave abnormalities: normal  Conduction Disutrbances:none  Narrative Interpretation:   Old EKG Reviewed: unchanged   Return immediately for fevers stiff neck, weakness numbness changes in speech or swallowing.  Follow up with Dr. Jonny Ruiz for recheck and recheck of INR which was 1.96.  Inform him of kidney cyst seen on CT. Patient and wife verbalize understanding and agree to follow up      Ho Parisi Smitty Cords, MD 07/19/11 [phone]

## 2011-07-19 NOTE — Assessment & Plan Note (Signed)
He has s/s c/w acute sinusitis and that was confirmed by the CT done earlier today. I advised him to continue with the augmentin and to let me know how he is doing over the next few days.

## 2011-07-19 NOTE — ED Notes (Signed)
Performed a visual Acuity and results were: Left Eye: 20/40 Right Eye: 20/30 Both Eyes: 20/25

## 2011-07-20 LAB — URINE CULTURE: Colony Count: NO GROWTH

## 2011-07-27 ENCOUNTER — Ambulatory Visit (INDEPENDENT_AMBULATORY_CARE_PROVIDER_SITE_OTHER): Payer: Medicare Other | Admitting: Pharmacist

## 2011-07-27 DIAGNOSIS — I2699 Other pulmonary embolism without acute cor pulmonale: Secondary | ICD-10-CM

## 2011-07-27 DIAGNOSIS — I82409 Acute embolism and thrombosis of unspecified deep veins of unspecified lower extremity: Secondary | ICD-10-CM

## 2011-07-27 DIAGNOSIS — Z7901 Long term (current) use of anticoagulants: Secondary | ICD-10-CM

## 2011-07-27 LAB — POCT INR: INR: 1.7

## 2011-08-08 ENCOUNTER — Ambulatory Visit (INDEPENDENT_AMBULATORY_CARE_PROVIDER_SITE_OTHER): Payer: Medicare Other | Admitting: Internal Medicine

## 2011-08-08 ENCOUNTER — Telehealth: Payer: Self-pay | Admitting: Internal Medicine

## 2011-08-08 ENCOUNTER — Encounter: Payer: Self-pay | Admitting: Internal Medicine

## 2011-08-08 VITALS — BP 106/60 | HR 73 | Temp 97.2°F | Ht 69.0 in | Wt 202.1 lb

## 2011-08-08 DIAGNOSIS — R7302 Impaired glucose tolerance (oral): Secondary | ICD-10-CM

## 2011-08-08 DIAGNOSIS — D126 Benign neoplasm of colon, unspecified: Secondary | ICD-10-CM

## 2011-08-08 DIAGNOSIS — I10 Essential (primary) hypertension: Secondary | ICD-10-CM

## 2011-08-08 DIAGNOSIS — R7309 Other abnormal glucose: Secondary | ICD-10-CM

## 2011-08-08 DIAGNOSIS — J019 Acute sinusitis, unspecified: Secondary | ICD-10-CM

## 2011-08-08 MED ORDER — LEVOFLOXACIN 250 MG PO TABS: 250.0000 mg | ORAL_TABLET | Freq: Every day | ORAL | Status: AC

## 2011-08-08 NOTE — Progress Notes (Signed)
Subjective:    Patient ID: Kevin Hale, male    DOB: Mar 01, 1940, 71 y.o.   MRN: [id number]  HPI  Here after being seen in ER May 23 with HA and tx for sinusutis found on Ct scan well leukocytosis at 14k; tx with 7 days only augmentin bid;  Did well iniially, but now -  Here with 3 days acute onset fever, facial pain, pressure, general weakness and malaise, and greenish d/c, with slight ST, but little to no cough and Pt denies chest pain, increased sob or doe, wheezing, orthopnea, PND, increased LE swelling, palpitations, dizziness or syncope.  Pt denies new neurological symptoms such as new headache, or facial or extremity weakness or numbness  Pt denies polydipsia, polyuria.  No prior hx of ENT/sinus surgury Using the nasal spray from the ER but not sure if working.  Not taking mucinex Past Medical History  Diagnosis Date  . History of DVT (deep vein thrombosis)   . History of colon polyps   . BPH (benign prostatic hyperplasia)   . Hypercholesteremia   . HTN (hypertension)   . Diverticulosis of colon   . DJD (degenerative joint disease)     left food  . Cancer of prostate   . Plantar fasciitis   . Glucose intolerance (impaired glucose tolerance)   . Anxiety   . Impaired glucose tolerance 11/03/2010   Past Surgical History  Procedure Date  . Uvulectomy   . Insertion prostate radiation seed     reports that he has been passively smoking.  He does not have any smokeless tobacco history on file. He reports that he does not drink alcohol or use illicit drugs. family history includes Deep vein thrombosis in his mother. No Known Allergies Current Outpatient Prescriptions on File Prior to Visit  Medication Sig Dispense Refill  . allopurinol (ZYLOPRIM) 100 MG tablet TAKE ONE TABLET BY MOUTH ONCE DAILY.  90 tablet  3  . fluticasone (FLONASE) 50 MCG/ACT nasal spray Place 2 sprays into the nose daily.  16 g  0  . lisinopril (PRINIVIL,ZESTRIL) 5 MG tablet TAKE ONE TABLET BY MOUTH EVERY DAY  30  tablet  11  . warfarin (COUMADIN) 5 MG tablet Take as directed by Anticoagulation clinic.  Pt takes up to 1 1/2 tablets daily.  45 tablet  3   Review of Systems Review of Systems  Constitutional: Negative for diaphoresis and unexpected weight change.  HENT: Negative for drooling and tinnitus.   Eyes: Negative for photophobia and visual disturbance.  Respiratory: Negative for choking and stridor.   Gastrointestinal: Negative for vomiting and blood in stool.  Genitourinary: Negative for hematuria and decreased urine volume.  Musculoskeletal: Negative for gait problem.  Psychiatric/Behavioral: Negative for decreased concentration. The patient is not hyperactive.      Objective:   Physical Exam BP 106/60  Pulse 73  Temp 97.2 F (36.2 C) (Oral)  Ht 5\' 9"  (1.753 m)  Wt 202 lb 2 oz (91.683 kg)  BMI 29.85 kg/m2  SpO2 98% Physical Exam  VS noted Constitutional: Pt appears well-developed and well-nourished.  HENT: Head: Normocephalic.  Right Ear: External ear normal.  Left Ear: External ear normal.  Bilat tm's mild erythema.  Sinus tender left  Max sinus > right.  Pharynx mild erythema Eyes: Conjunctivae and EOM are normal. Pupils are equal, round, and reactive to light.  Neck: Normal range of motion. Neck supple.  Cardiovascular: Normal rate and regular rhythm.   Pulmonary/Chest: Effort normal and breath sounds normal.  Neurological: Pt is alert. No cranial nerve deficit.  Skin: Skin is warm. No erythema.  Psychiatric: Pt behavior is normal. Thought content normal. 1`+ nervous    Assessment & Plan:

## 2011-08-08 NOTE — Assessment & Plan Note (Signed)
stable overall by hx and exam, most recent data reviewed with pt, and pt to continue medical treatment as before For lab next visit Lab Results  Component Value Date   WBC 14.1* 07/19/2011   HGB 16.0 07/19/2011   HCT 46.8 07/19/2011   PLT 328 07/19/2011   GLUCOSE 138* 07/19/2011   CHOL 174 11/06/2010   TRIG 104.0 11/06/2010   HDL 44.90 11/06/2010   LDLCALC 108* 11/06/2010   ALT 26 11/06/2010   AST 23 11/06/2010   NA 136 07/19/2011   K 4.1 07/19/2011   CL 102 07/19/2011   CREATININE 1.00 07/19/2011   BUN 12 07/19/2011   CO2 25 07/19/2011   TSH 1.13 11/06/2010   PSA 0.12 11/06/2010   INR 1.7 07/27/2011   HGBA1C 5.7 11/06/2010

## 2011-08-08 NOTE — Assessment & Plan Note (Signed)
Recurrent vs suboptimal tx - for levaquin x 10 dya, mucinex, cont the nasal spray, consider dedicated CT sinus and/or ENT if not improve 7-10 days

## 2011-08-08 NOTE — Patient Instructions (Addendum)
Take all new medications as prescribed Please call in 2 weeks if you feel you need further evaluation with CT sinus and/or ENT evaluation Continue all other medications as before, including the coumadin Please keep your appointments with your specialists as you have planned - coumadin clinic You will be contacted regarding the referral for: colonoscopy No other treatment needed at this time for the kidney cyst or diverticulosis Please return in 3 mo with Lab testing done 3-5 days before

## 2011-08-08 NOTE — Assessment & Plan Note (Signed)
Due for 5 yr f/u colonscoyp - will order

## 2011-08-08 NOTE — Telephone Encounter (Signed)
New msg Pt is taking levofloxacin and wants to know could he take with warfarin

## 2011-08-08 NOTE — Telephone Encounter (Signed)
Will forward to coumadin clinic.

## 2011-08-08 NOTE — Assessment & Plan Note (Signed)
stable overall by hx and exam, most recent data reviewed with pt, and pt to continue medical treatment as before BP Readings from Last 3 Encounters:  08/08/11 106/60  07/19/11 140/70  07/19/11 149/67

## 2011-08-08 NOTE — Telephone Encounter (Signed)
Spoke with patient on phone,  started levaquin 250 mg today for 10 days for sinus infection, rescheduled appt.  from 6/21 to 6/17.

## 2011-08-13 ENCOUNTER — Ambulatory Visit (INDEPENDENT_AMBULATORY_CARE_PROVIDER_SITE_OTHER): Payer: Medicare Other

## 2011-08-13 DIAGNOSIS — I82409 Acute embolism and thrombosis of unspecified deep veins of unspecified lower extremity: Secondary | ICD-10-CM

## 2011-08-13 DIAGNOSIS — I2699 Other pulmonary embolism without acute cor pulmonale: Secondary | ICD-10-CM

## 2011-08-13 DIAGNOSIS — Z7901 Long term (current) use of anticoagulants: Secondary | ICD-10-CM

## 2011-08-13 LAB — POCT INR: INR: 3.1

## 2011-08-15 ENCOUNTER — Encounter: Payer: Self-pay | Admitting: Internal Medicine

## 2011-08-24 ENCOUNTER — Ambulatory Visit (INDEPENDENT_AMBULATORY_CARE_PROVIDER_SITE_OTHER): Payer: Medicare Other | Admitting: *Deleted

## 2011-08-24 DIAGNOSIS — I2699 Other pulmonary embolism without acute cor pulmonale: Secondary | ICD-10-CM

## 2011-08-24 DIAGNOSIS — Z7901 Long term (current) use of anticoagulants: Secondary | ICD-10-CM

## 2011-08-24 DIAGNOSIS — I82409 Acute embolism and thrombosis of unspecified deep veins of unspecified lower extremity: Secondary | ICD-10-CM

## 2011-09-12 ENCOUNTER — Ambulatory Visit (INDEPENDENT_AMBULATORY_CARE_PROVIDER_SITE_OTHER): Payer: Medicare Other | Admitting: Internal Medicine

## 2011-09-12 ENCOUNTER — Encounter: Payer: Self-pay | Admitting: Internal Medicine

## 2011-09-12 VITALS — BP 122/70 | HR 92 | Ht 69.0 in | Wt 203.0 lb

## 2011-09-12 DIAGNOSIS — D689 Coagulation defect, unspecified: Secondary | ICD-10-CM

## 2011-09-12 DIAGNOSIS — Z1211 Encounter for screening for malignant neoplasm of colon: Secondary | ICD-10-CM

## 2011-09-12 DIAGNOSIS — Z86010 Personal history of colonic polyps: Secondary | ICD-10-CM

## 2011-09-12 MED ORDER — MOVIPREP 100 G PO SOLR: 1.0000 | Freq: Once | ORAL | Status: DC

## 2011-09-12 NOTE — Patient Instructions (Addendum)
You have been scheduled for a colonoscopy with propofol. Please follow written instructions given to you at your visit today.  Please pick up your prep kit at the pharmacy within the next 1-3 days. If you use inhalers (even only as needed), please bring them with you on the day of your procedure.  You may stay on your Coumadin

## 2011-09-12 NOTE — Progress Notes (Signed)
HISTORY OF PRESENT ILLNESS:  Kevin Hale is a 71 y.o. male with multiple medical problems as listed below. I last saw the patient in July 2008 as a direct referral for surveillance colonoscopy. His initial colonoscopy was performed in [place] about 10 years ago. His examination here revealed several diminutive polyps which were removed and found to be adenomatous. In addition pandiverticulosis. Since that time, patient has been placed on Coumadin therapy. He has a remote history of DVT with pulmonary embolus and more recent recurrence of the same. He has had no problems over the past year. His chronic medical problems are stable. He has his Coumadin monitored at the Coumadin clinic. His GI review of systems is negative. He is interested in surveillance colonoscopy. He has reservations about coming off Coumadin due to recurrent problems with DVT and pulmonary embolus. CT scan in May of 2013 to evaluate flank pain revealed no significant abnormalities.Marland Kitchen  REVIEW OF SYSTEMS:  All non-GI ROS negative except for interval prostate cancer  Past Medical History  Diagnosis Date  . History of DVT (deep vein thrombosis)   . History of colon polyps   . BPH (benign prostatic hyperplasia)   . Hypercholesteremia   . HTN (hypertension)   . Diverticulosis of colon   . DJD (degenerative joint disease)     left foot, "pt denies"  . Cancer of prostate   . Plantar fasciitis   . Glucose intolerance (impaired glucose tolerance) 11/03/2010  . Anxiety     Past Surgical History  Procedure Date  . Uvulectomy   . Insertion prostate radiation seed     Social History Kevin Hale  reports that he has quit smoking. He has never used smokeless tobacco. He reports that he does not drink alcohol or use illicit drugs.  family history includes Deep vein thrombosis in his mother and Diabetes in his mother.  No Known Allergies     PHYSICAL EXAMINATION: Vital signs: BP 122/70  Pulse 92  Ht 5\' 9"  (1.753 m)  Wt  203 lb (92.08 kg)  BMI 29.98 kg/m2  Constitutional: generally well-appearing, no acute distress Psychiatric: alert and oriented x3, cooperative Eyes: extraocular movements intact, anicteric, conjunctiva pink Mouth: oral pharynx moist, no lesions Neck: supple no lymphadenopathy Cardiovascular: heart regular rate and rhythm, no murmur Lungs: clear to auscultation bilaterally Abdomen: soft, nontender, nondistended, no obvious ascites, no peritoneal signs, normal bowel sounds, no organomegaly Rectal:deferred until colonoscopy Extremities: no lower extremity edema bilaterally Skin: no lesions on visible extremities Neuro: No focal deficits.   ASSESSMENT:  #1. Personal history of adenomatous colon polyps. Due for surveillance #2. 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 Colonoscopy.The nature of the procedure, as well as the risks, benefits, and alternatives were carefully and thoroughly reviewed with the patient. Ample time for discussion and questions allowed. The patient understood, was satisfied, and agreed to proceed. The patient is higher than baseline risk due to his comorbidities, specifically issue surrounding chronic Coumadin therapy. #2. We discussed the pros and cons of interrupting Coumadin therapy. Also, alternative measures such as Lovenox bridge. Patient has decided to continue on Coumadin therapy for his exam with potential problems and or limitations understood. #3. Movi prep prescribed. The patient instructed on its use

## 2011-09-13 ENCOUNTER — Encounter: Payer: Self-pay | Admitting: Internal Medicine

## 2011-09-18 ENCOUNTER — Ambulatory Visit (INDEPENDENT_AMBULATORY_CARE_PROVIDER_SITE_OTHER): Payer: Medicare Other | Admitting: Pharmacist

## 2011-09-18 DIAGNOSIS — I82409 Acute embolism and thrombosis of unspecified deep veins of unspecified lower extremity: Secondary | ICD-10-CM

## 2011-09-18 DIAGNOSIS — I2699 Other pulmonary embolism without acute cor pulmonale: Secondary | ICD-10-CM

## 2011-09-18 DIAGNOSIS — Z7901 Long term (current) use of anticoagulants: Secondary | ICD-10-CM

## 2011-10-16 ENCOUNTER — Ambulatory Visit (INDEPENDENT_AMBULATORY_CARE_PROVIDER_SITE_OTHER): Payer: Medicare Other | Admitting: *Deleted

## 2011-10-16 DIAGNOSIS — I82409 Acute embolism and thrombosis of unspecified deep veins of unspecified lower extremity: Secondary | ICD-10-CM

## 2011-10-16 DIAGNOSIS — Z7901 Long term (current) use of anticoagulants: Secondary | ICD-10-CM

## 2011-10-16 DIAGNOSIS — I2699 Other pulmonary embolism without acute cor pulmonale: Secondary | ICD-10-CM

## 2011-10-21 ENCOUNTER — Other Ambulatory Visit: Payer: Self-pay | Admitting: Internal Medicine

## 2011-10-25 ENCOUNTER — Encounter: Payer: Self-pay | Admitting: Internal Medicine

## 2011-10-25 ENCOUNTER — Ambulatory Visit (AMBULATORY_SURGERY_CENTER): Payer: Medicare Other | Admitting: Internal Medicine

## 2011-10-25 VITALS — BP 138/78 | HR 75 | Temp 96.5°F | Resp 20 | Ht 69.0 in | Wt 203.0 lb

## 2011-10-25 DIAGNOSIS — Z1211 Encounter for screening for malignant neoplasm of colon: Secondary | ICD-10-CM

## 2011-10-25 DIAGNOSIS — Z86010 Personal history of colonic polyps: Secondary | ICD-10-CM

## 2011-10-25 MED ORDER — SODIUM CHLORIDE 0.9 % IV SOLN: 500.0000 mL | INTRAVENOUS | Status: DC

## 2011-10-25 NOTE — Patient Instructions (Addendum)

## 2011-10-25 NOTE — Progress Notes (Signed)
Patient did not experience any of the following events: a burn prior to discharge; a fall within the facility; wrong site/side/patient/procedure/implant event; or a hospital transfer or hospital admission upon discharge from the facility. (G8907) Patient did not have preoperative order for IV antibiotic SSI prophylaxis. (G8918)  

## 2011-10-25 NOTE — Op Note (Signed)
[facility] [address] [place], [address]   COLONOSCOPY PROCEDURE REPORT  PATIENT: Kevin Hale, Kevin Hale  MR#: [id number] BIRTHDATE: 24-Aug-1940 , 70  yrs. old GENDER: Male ENDOSCOPIST: Roxy Cedar, MD REFERRED AO:[id number] Program Recall PROCEDURE DATE:  10/25/2011 PROCEDURE:   Colonoscopy, surveillance ASA CLASS:   Class III INDICATIONS:screening and surveillance,personal history of colonic polyps. Index 10 yrs ago ([place]), last 08-2006 ([place]). MEDICATIONS: MAC sedation, administered by CRNA and propofol (Diprivan) 200mg  IV  DESCRIPTION OF PROCEDURE:   After the risks benefits and alternatives of the procedure were thoroughly explained, informed consent was obtained.  A digital rectal exam revealed no abnormalities of the rectum.   The LB [id number]  endoscope was introduced through the anus and advanced to the cecum, which was identified by both the appendix and ileocecal valve. No adverse events experienced.   The quality of the prep was excellent, using MoviPrep  The instrument was then slowly withdrawn as the colon was fully examined.      COLON FINDINGS: Moderate diverticulosis was noted throughout the entire examined colon.   The colon mucosa was otherwise normal. Retroflexed views revealed internal hemorrhoids. The time to cecum=2 minutes 45 seconds.  Withdrawal time=9 minutes 30 seconds. The scope was withdrawn and the procedure completed. COMPLICATIONS: There were no complications.  ENDOSCOPIC IMPRESSION: 1.   Moderate diverticulosis was noted throughout the entire examined colon 2.   The colon mucosa was otherwise normal  RECOMMENDATIONS: 1. Follow up colonoscopy in 5 years   eSigned:  Roxy Cedar, MD 10/25/2011 10:31 AM   cc: Corwin Levins, MD and The Patient

## 2011-10-25 NOTE — Progress Notes (Signed)
Propofol given per [facility] CRNA and oxygen managed

## 2011-10-26 ENCOUNTER — Telehealth: Payer: Self-pay | Admitting: *Deleted

## 2011-10-26 NOTE — Telephone Encounter (Signed)
  Follow up Call-  Call back number 10/25/2011  Post procedure Call Back phone  # [phone]  Permission to leave phone message Yes     Patient questions:  Do you have a fever, pain , or abdominal swelling? no Pain Score  0 *  Have you tolerated food without any problems? yes  Have you been able to return to your normal activities? yes  Do you have any questions about your discharge instructions: Diet   no Medications  no Follow up visit  no  Do you have questions or concerns about your Care? no  Actions: * If pain score is 4 or above: No action needed, pain <4.

## 2011-10-31 ENCOUNTER — Encounter: Payer: Self-pay | Admitting: Pharmacist

## 2011-11-06 ENCOUNTER — Ambulatory Visit (INDEPENDENT_AMBULATORY_CARE_PROVIDER_SITE_OTHER): Payer: Medicare Other | Admitting: *Deleted

## 2011-11-06 DIAGNOSIS — I82409 Acute embolism and thrombosis of unspecified deep veins of unspecified lower extremity: Secondary | ICD-10-CM

## 2011-11-06 DIAGNOSIS — I2699 Other pulmonary embolism without acute cor pulmonale: Secondary | ICD-10-CM

## 2011-11-06 DIAGNOSIS — Z7901 Long term (current) use of anticoagulants: Secondary | ICD-10-CM

## 2011-11-07 ENCOUNTER — Other Ambulatory Visit (INDEPENDENT_AMBULATORY_CARE_PROVIDER_SITE_OTHER): Payer: Medicare Other

## 2011-11-07 DIAGNOSIS — Z79899 Other long term (current) drug therapy: Secondary | ICD-10-CM

## 2011-11-07 DIAGNOSIS — I10 Essential (primary) hypertension: Secondary | ICD-10-CM

## 2011-11-07 DIAGNOSIS — R7302 Impaired glucose tolerance (oral): Secondary | ICD-10-CM

## 2011-11-07 DIAGNOSIS — R7309 Other abnormal glucose: Secondary | ICD-10-CM

## 2011-11-07 DIAGNOSIS — Z0000 Encounter for general adult medical examination without abnormal findings: Secondary | ICD-10-CM

## 2011-11-07 LAB — CBC WITH DIFFERENTIAL/PLATELET
Basophils Absolute: 0 10*3/uL (ref 0.0–0.1)
Eosinophils Relative: 3.6 % (ref 0.0–5.0)
Monocytes Absolute: 0.6 10*3/uL (ref 0.1–1.0)
Monocytes Relative: 8.9 % (ref 3.0–12.0)
Neutrophils Relative %: 41.7 % — ABNORMAL LOW (ref 43.0–77.0)
Platelets: 262 10*3/uL (ref 150.0–400.0)
RDW: 13.2 % (ref 11.5–14.6)
WBC: 7 10*3/uL (ref 4.5–10.5)

## 2011-11-07 LAB — HEMOGLOBIN A1C: Hgb A1c MFr Bld: 5.6 % (ref 4.6–6.5)

## 2011-11-07 LAB — URINALYSIS, ROUTINE W REFLEX MICROSCOPIC
Nitrite: NEGATIVE
Specific Gravity, Urine: 1.015 (ref 1.000–1.030)
Total Protein, Urine: NEGATIVE
Urine Glucose: NEGATIVE
pH: 6.5 (ref 5.0–8.0)

## 2011-11-07 LAB — TSH: TSH: 0.79 u[IU]/mL (ref 0.35–5.50)

## 2011-11-08 LAB — BASIC METABOLIC PANEL
CO2: 27 mEq/L (ref 19–32)
Calcium: 9.3 mg/dL (ref 8.4–10.5)
GFR: 78.04 mL/min (ref >60.00)
Glucose, Bld: 133 mg/dL — ABNORMAL HIGH (ref 70–99)
Potassium: 4.6 mEq/L (ref 3.5–5.1)
Sodium: 138 mEq/L (ref 135–145)

## 2011-11-08 LAB — LIPID PANEL
HDL: 37.3 mg/dL — ABNORMAL LOW (ref >39.00)
Total CHOL/HDL Ratio: 5
VLDL: 24.4 mg/dL (ref 0.0–40.0)

## 2011-11-08 LAB — HEPATIC FUNCTION PANEL
AST: 28 U/L (ref 0–37)
Albumin: 4.1 g/dL (ref 3.5–5.2)
Alkaline Phosphatase: 71 U/L (ref 39–117)
Bilirubin, Direct: 0.1 mg/dL (ref 0.0–0.3)

## 2011-11-12 ENCOUNTER — Encounter: Payer: Self-pay | Admitting: Internal Medicine

## 2011-11-12 ENCOUNTER — Ambulatory Visit (INDEPENDENT_AMBULATORY_CARE_PROVIDER_SITE_OTHER): Payer: Medicare Other | Admitting: Internal Medicine

## 2011-11-12 VITALS — BP 122/80 | HR 90 | Temp 98.4°F | Ht 69.0 in | Wt 208.1 lb

## 2011-11-12 DIAGNOSIS — H6120 Impacted cerumen, unspecified ear: Secondary | ICD-10-CM

## 2011-11-12 DIAGNOSIS — E7800 Pure hypercholesterolemia, unspecified: Secondary | ICD-10-CM

## 2011-11-12 DIAGNOSIS — L723 Sebaceous cyst: Secondary | ICD-10-CM

## 2011-11-12 DIAGNOSIS — I700 Atherosclerosis of aorta: Secondary | ICD-10-CM | POA: Insufficient documentation

## 2011-11-12 DIAGNOSIS — M51369 Other intervertebral disc degeneration, lumbar region without mention of lumbar back pain or lower extremity pain: Secondary | ICD-10-CM | POA: Insufficient documentation

## 2011-11-12 DIAGNOSIS — R7302 Impaired glucose tolerance (oral): Secondary | ICD-10-CM

## 2011-11-12 DIAGNOSIS — Z0000 Encounter for general adult medical examination without abnormal findings: Secondary | ICD-10-CM

## 2011-11-12 DIAGNOSIS — M5136 Other intervertebral disc degeneration, lumbar region: Secondary | ICD-10-CM

## 2011-11-12 DIAGNOSIS — R7309 Other abnormal glucose: Secondary | ICD-10-CM

## 2011-11-12 HISTORY — DX: Other intervertebral disc degeneration, lumbar region: M51.36

## 2011-11-12 HISTORY — DX: Atherosclerosis of aorta: I70.0

## 2011-11-12 HISTORY — DX: Other intervertebral disc degeneration, lumbar region without mention of lumbar back pain or lower extremity pain: M51.369

## 2011-11-12 MED ORDER — PRAVASTATIN SODIUM 20 MG PO TABS: 20.0000 mg | ORAL_TABLET | Freq: Every day | ORAL | Status: DC

## 2011-11-12 NOTE — Assessment & Plan Note (Signed)
stable overall by hx and exam, most recent data reviewed with pt, and pt to continue medical treatment as before Lab Results  Component Value Date   HGBA1C 5.6 11/07/2011    

## 2011-11-12 NOTE — Addendum Note (Signed)
Addended by: Scharlene Gloss B on: 11/12/2011 11:24 AM   Modules accepted: Orders

## 2011-11-12 NOTE — Patient Instructions (Addendum)
The shingles shot copay for you is $95;  Please call for nurse appointment if you decide to have this done Take all new medications as prescribed  - the cholesterol medication Continue all other medications as before Please have the pharmacy call with any refills you may need Please continue your efforts at being more active, low cholesterol diet, and weight control. You are otherwise up to date with prevention today Please call if you want the general surgury referral for your sebaceous cyst near the left shoulder Your right ear was irrigated of wax today You should call your insurance and find out, but your copay for the coumadin clinic at [facility] may be less than the cost at the [facility] office.  Please call if you wish to change.  Please return in 6 mo with Lab testing done 3-5 days before Please remember to sign up for My Chart at your earliest convenience, as this will be important to you in the future with finding out test results.

## 2011-11-12 NOTE — Progress Notes (Signed)
Subjective:    Patient ID: Kevin Hale, male    DOB: January 14, 1941, 71 y.o.   MRN: [id number]  HPI  Here for wellness and f/u;  Overall doing ok;  Pt denies CP, worsening SOB, DOE, wheezing, orthopnea, PND, worsening LE edema, palpitations, dizziness or syncope.  Pt denies neurological change such as new Headache, facial or extremity weakness.  Pt denies polydipsia, polyuria, or low sugar symptoms. Pt states overall good compliance with treatment and medications, good tolerability, and trying to follow lower cholesterol diet.  Pt denies worsening depressive symptoms, suicidal ideation or panic. No fever, wt loss, night sweats, loss of appetite, or other constitutional symptoms.  Pt states good ability with ADL's, low fall risk, home safety reviewed and adequate, no significant changes in hearing or vision, and occasionally active with exercise.  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 Has ongoing right pedal/ankle edema with numerous varicosities.  Overall good compliance with treatment, and good medicine tolerability, including the coumadin, no overt bleeding or bruising.  Has some difficulty with hearing on the right side for the past wk - ? wax   Past Medical History  Diagnosis Date  . History of DVT (deep vein thrombosis)   . History of colon polyps   . BPH (benign prostatic hyperplasia)   . Hypercholesteremia   . HTN (hypertension)   . Diverticulosis of colon   . DJD (degenerative joint disease)     left foot, "pt denies"  . Cancer of prostate   . Plantar fasciitis   . Glucose intolerance (impaired glucose tolerance) 11/03/2010  . Anxiety   . Lumbar degenerative disc disease 11/12/2011  . Atherosclerosis of abdominal aorta 11/12/2011   Past Surgical History  Procedure Date  . Uvulectomy   . Insertion prostate radiation seed     reports that he has quit smoking. He has never used smokeless tobacco. He reports that he does not drink  alcohol or use illicit drugs. family history includes Deep vein thrombosis in his mother and Diabetes in his mother. No Known Allergies Current Outpatient Prescriptions on File Prior to Visit  Medication Sig Dispense Refill  . allopurinol (ZYLOPRIM) 100 MG tablet TAKE ONE TABLET BY MOUTH ONCE DAILY.  90 tablet  3  . lisinopril (PRINIVIL,ZESTRIL) 5 MG tablet TAKE ONE TABLET BY MOUTH EVERY DAY  30 tablet  11  . Multiple Vitamin (MULTIVITAMIN) capsule Take 1 capsule by mouth daily.      Marland Kitchen warfarin (COUMADIN) 5 MG tablet TAKE AS DIRECTED BY ANTICOAGULATION CLINIC.  PT TAKES UP TO 1 AND 1/2 TABLETS DAILY  45 tablet  3  . pravastatin (PRAVACHOL) 20 MG tablet Take 1 tablet (20 mg total) by mouth daily.  90 tablet  3   Review of Systems Review of Systems  Constitutional: Negative for diaphoresis, activity change, appetite change and unexpected weight change.  HENT: Negative for hearing loss, ear pain, facial swelling, mouth sores and neck stiffness.   Eyes: Negative for pain, redness and visual disturbance.  Respiratory: Negative for shortness of breath and wheezing.   Cardiovascular: Negative for chest pain and palpitations.  Gastrointestinal: Negative for diarrhea, blood in stool, abdominal distention and rectal pain.  Genitourinary: Negative for hematuria, flank pain and decreased urine volume.  Musculoskeletal: Negative for myalgias and joint swelling.  Skin: Negative for color change and wound.  Neurological: Negative for syncope and numbness.  Hematological: Negative for adenopathy.  Psychiatric/Behavioral: Negative for hallucinations, self-injury, decreased  concentration and agitation.     Objective:   Physical Exam BP 122/80  Pulse 90  Temp 98.4 F (36.9 C) (Oral)  Ht 5\' 9"  (1.753 m)  Wt 208 lb 2 oz (94.405 kg)  BMI 30.73 kg/m2  SpO2 97% Physical Exam  VS noted Constitutional: Pt is oriented to person, place, and time. Appears well-developed and well-nourished.  HENT:  Head:  Normocephalic and atraumatic.  Right Ear: External ear normal.  Left Ear: External ear normal.  Nose: Nose normal.  Mouth/Throat: Oropharynx is clear and moist.  Right ear canal clear after wax removed, hearing improved Eyes: Conjunctivae and EOM are normal. Pupils are equal, round, and reactive to light.  Neck: Normal range of motion. Neck supple. No JVD present. No tracheal deviation present.  Cardiovascular: Normal rate, regular rhythm, normal heart sounds and intact distal pulses.   Pulmonary/Chest: Effort normal and breath sounds normal.  Abdominal: Soft. Bowel sounds are normal. There is no tenderness.  Musculoskeletal: Normal range of motion. Exhibits no edema.  Lymphadenopathy:  Has no cervical adenopathy.  Neurological: Pt is alert and oriented to person, place, and time. Pt has normal reflexes. No cranial nerve deficit.  Skin: Skin is warm and dry. No rash noted. 1cm left anterior shoulder sebac cyst , not inflamed Psychiatric:  Has  normal mood and affect. Behavior is normal.     Assessment & Plan:

## 2011-11-12 NOTE — Assessment & Plan Note (Signed)
In light of aortic athersclerosis on CT may 2013, goal ldl < 70 - for pravastatin 20 mg qd

## 2011-11-12 NOTE — Assessment & Plan Note (Signed)
Left anterior shoulder, noninflamed, declines gen surgury,  to f/u any worsening symptoms or concerns

## 2011-11-15 ENCOUNTER — Telehealth: Payer: Self-pay | Admitting: Internal Medicine

## 2011-11-15 NOTE — Telephone Encounter (Signed)
Called patient and he stated that he usually gets doppler of leg when he comes to see Dr Tenny Craw and is requesting to have that done at upcoming appointment.  When asked if he was having some issues he stated he was still having some tightness in leg.  Reviewed last dictation and did not see any mention of getting a doppler. Will forward to Dr Tenny Craw for review

## 2011-11-15 NOTE — Telephone Encounter (Signed)
New Problem:    Patient would like to have an ultra sound of his leg performed on 12/06/11 when he comes in to see Dr. Tenny Craw to check on the blood clot in his leg.  Please call back.

## 2011-11-15 NOTE — Telephone Encounter (Signed)
I can't find when he had LE dopplers done in office. If on coumadin, I do not see reason to repat venous doppler if that is what he is asking for.

## 2011-11-16 NOTE — Telephone Encounter (Signed)
**Note De-Identified Kevin Hale Obfuscation** Pt. advised, he verbalized understanding and states he will address at next OV with Dr. Tenny Craw.Marland Kitchen/LV

## 2011-11-27 ENCOUNTER — Ambulatory Visit (INDEPENDENT_AMBULATORY_CARE_PROVIDER_SITE_OTHER): Payer: Medicare Other | Admitting: *Deleted

## 2011-11-27 DIAGNOSIS — I2699 Other pulmonary embolism without acute cor pulmonale: Secondary | ICD-10-CM

## 2011-11-27 DIAGNOSIS — Z7901 Long term (current) use of anticoagulants: Secondary | ICD-10-CM

## 2011-11-27 DIAGNOSIS — I82409 Acute embolism and thrombosis of unspecified deep veins of unspecified lower extremity: Secondary | ICD-10-CM

## 2011-11-27 LAB — POCT INR: INR: 2.6

## 2011-12-06 ENCOUNTER — Encounter: Payer: Self-pay | Admitting: Internal Medicine

## 2011-12-06 ENCOUNTER — Ambulatory Visit (INDEPENDENT_AMBULATORY_CARE_PROVIDER_SITE_OTHER): Payer: Medicare Other | Admitting: Internal Medicine

## 2011-12-06 VITALS — BP 150/62 | HR 85 | Ht 69.0 in | Wt 209.0 lb

## 2011-12-06 DIAGNOSIS — E7800 Pure hypercholesterolemia, unspecified: Secondary | ICD-10-CM

## 2011-12-06 NOTE — Progress Notes (Signed)
HPI Patient is a 71 year old with a history of chronic LE DVT and PE.  I saw him in October 2012.  Note echo in 2012 showed normal LV and RV function. No presyncope  No CP  Sometimes a little sob with stairs. Doesn't walk regularly BP is up and down.  BP at [facility] 120s. Last lipid pane in September LDL was 109  He was started on pravastatin given atherosclerotic plaquing on aorta on CT. No Known Allergies  Current Outpatient Prescriptions  Medication Sig Dispense Refill  . allopurinol (ZYLOPRIM) 100 MG tablet TAKE ONE TABLET BY MOUTH ONCE DAILY.  90 tablet  3  . lisinopril (PRINIVIL,ZESTRIL) 5 MG tablet TAKE ONE TABLET BY MOUTH EVERY DAY  30 tablet  11  . Multiple Vitamin (MULTIVITAMIN) capsule Take 1 capsule by mouth daily.      . pravastatin (PRAVACHOL) 20 MG tablet Take 1 tablet (20 mg total) by mouth daily.  90 tablet  3  . warfarin (COUMADIN) 5 MG tablet TAKE AS DIRECTED BY ANTICOAGULATION CLINIC.  PT TAKES UP TO 1 AND 1/2 TABLETS DAILY  45 tablet  3    Past Medical History  Diagnosis Date  . History of DVT (deep vein thrombosis)   . History of colon polyps   . BPH (benign prostatic hyperplasia)   . Hypercholesteremia   . HTN (hypertension)   . Diverticulosis of colon   . DJD (degenerative joint disease)     left foot, "pt denies"  . Cancer of prostate   . Plantar fasciitis   . Glucose intolerance (impaired glucose tolerance) 11/03/2010  . Anxiety   . Lumbar degenerative disc disease 11/12/2011  . Atherosclerosis of abdominal aorta 11/12/2011    Past Surgical History  Procedure Date  . Uvulectomy   . Insertion prostate radiation seed     Family History  Problem Relation Age of Onset  . Deep vein thrombosis Mother   . Diabetes Mother     History   Social History  . Marital Status: Married    Spouse Name: N/A    Number of Children: 2  . Years of Education: N/A   Occupational History  . retired    Social History Main Topics  . Smoking status: Former Games developer  .  Smokeless tobacco: Never Used  . Alcohol Use: No  . Drug Use: No  . Sexually Active: Not Currently   Other Topics Concern  . Not on file   Social History Narrative  . No narrative on file    Review of Systems:  All systems reviewed.  They are negative to the above problem except as previously stated.  Vital Signs: BP 150.62  P 85.  Wt 209 Physical Exam Patinet in NAD  BP HEENT:  Normocephalic, atraumatic. EOMI, PERRLA.  Neck: JVP is normal.  No bruits.  Lungs: clear to auscultation. No rales no wheezes.  Heart: Regular rate and rhythm. Normal S1, S2. No S3.   No significant murmurs. PMI not displaced.  Abdomen:  Supple, nontender. Normal bowel sounds. No masses. No hepatomegaly.  Extremities:   Good distal pulses throughout. Tr lower extremity edema RLE Musculoskeletal :moving all extremities.  Neuro:   alert and oriented x3.  CN II-XII grossly intact.  NSR 84.   LAFB.  LVH Assessment and Plan: 1.  PE/DVT  COntinue lifelong coumadin.  He called to have his leg rescanned.  Given that he has had no change in symptoms or exam I would not recommend  2.  HTN  Lable.  At last appt was 120s.  Follow  3.  HL  Agree with  Statin with CT finding.  Will arrange f/u  Encouraged him to increase active.  He wants to lose wt.  F/p in 1 year.

## 2011-12-06 NOTE — Patient Instructions (Addendum)
Your physician wants you to follow-up in: 1 year with Dr. Tenny Craw. You will receive a reminder letter in the mail two months in advance. If you don't receive a letter, please call our office to schedule the follow-up appointment.  Your physician recommends that you return for fasting lab work at the [facility] the end of November. We will call you with your results  Your physician recommends that you continue on your current medications as directed. Please refer to the Current Medication list given to you today.

## 2011-12-25 ENCOUNTER — Ambulatory Visit (INDEPENDENT_AMBULATORY_CARE_PROVIDER_SITE_OTHER): Payer: Medicare Other | Admitting: *Deleted

## 2011-12-25 DIAGNOSIS — I2699 Other pulmonary embolism without acute cor pulmonale: Secondary | ICD-10-CM

## 2011-12-25 DIAGNOSIS — I82409 Acute embolism and thrombosis of unspecified deep veins of unspecified lower extremity: Secondary | ICD-10-CM

## 2011-12-25 DIAGNOSIS — Z7901 Long term (current) use of anticoagulants: Secondary | ICD-10-CM

## 2012-01-21 ENCOUNTER — Other Ambulatory Visit: Payer: Medicare Other

## 2012-01-22 ENCOUNTER — Ambulatory Visit (INDEPENDENT_AMBULATORY_CARE_PROVIDER_SITE_OTHER): Payer: Medicare Other | Admitting: Pharmacist

## 2012-01-22 DIAGNOSIS — Z7901 Long term (current) use of anticoagulants: Secondary | ICD-10-CM

## 2012-01-22 DIAGNOSIS — I2699 Other pulmonary embolism without acute cor pulmonale: Secondary | ICD-10-CM

## 2012-01-22 DIAGNOSIS — I82409 Acute embolism and thrombosis of unspecified deep veins of unspecified lower extremity: Secondary | ICD-10-CM

## 2012-01-28 ENCOUNTER — Other Ambulatory Visit: Payer: Self-pay | Admitting: Internal Medicine

## 2012-01-28 ENCOUNTER — Ambulatory Visit (INDEPENDENT_AMBULATORY_CARE_PROVIDER_SITE_OTHER): Payer: Medicare Other | Admitting: *Deleted

## 2012-01-28 ENCOUNTER — Other Ambulatory Visit: Payer: Self-pay | Admitting: *Deleted

## 2012-01-28 VITALS — BP 112/60 | HR 60 | Wt 209.0 lb

## 2012-01-28 DIAGNOSIS — I2782 Chronic pulmonary embolism: Secondary | ICD-10-CM

## 2012-01-28 DIAGNOSIS — I10 Essential (primary) hypertension: Secondary | ICD-10-CM

## 2012-01-28 LAB — CBC WITH DIFFERENTIAL/PLATELET
Basophils Absolute: 0 10*3/uL (ref 0.0–0.1)
Basophils Relative: 0 % (ref 0–1)
Eosinophils Relative: 1 % (ref 0–5)
HCT: 49.6 % (ref 39.0–52.0)
Lymphocytes Relative: 23 % (ref 12–46)
MCH: 28.9 pg (ref 26.0–34.0)
MCHC: 33.1 g/dL (ref 30.0–36.0)
MCV: 87.3 fL (ref 78.0–100.0)
Monocytes Absolute: 0.6 10*3/uL (ref 0.1–1.0)
RDW: 13.3 % (ref 11.5–15.5)

## 2012-01-28 NOTE — Progress Notes (Signed)
Pt walked into clinic c/o sudden onset diaphoresis, blurred vision, and weakness approx 1 hour prior to arrival while sitting.  Pt appears to be very anxious.  Appears in no acute distress.  Denies chest pain, sob, n/v.  Took 2 coricidin last night for stuffy/runny nose.  Pt is not orthostatic.  Discussed with Dr. Tenny Craw who advised CBC, D-Dimer and Pt/INR.  Will draw labs and notify pt with results.

## 2012-01-29 ENCOUNTER — Telehealth: Payer: Self-pay | Admitting: Internal Medicine

## 2012-01-29 NOTE — Telephone Encounter (Signed)
plz return call to pt [phone] regarding lab results

## 2012-01-29 NOTE — Telephone Encounter (Signed)
Pt calling back re blood test results

## 2012-01-29 NOTE — Telephone Encounter (Signed)
Called patient with lab results.  

## 2012-02-26 ENCOUNTER — Ambulatory Visit (INDEPENDENT_AMBULATORY_CARE_PROVIDER_SITE_OTHER): Payer: Medicare Other | Admitting: *Deleted

## 2012-02-26 DIAGNOSIS — I2699 Other pulmonary embolism without acute cor pulmonale: Secondary | ICD-10-CM

## 2012-02-26 DIAGNOSIS — I82409 Acute embolism and thrombosis of unspecified deep veins of unspecified lower extremity: Secondary | ICD-10-CM

## 2012-02-26 DIAGNOSIS — Z7901 Long term (current) use of anticoagulants: Secondary | ICD-10-CM

## 2012-02-26 LAB — POCT INR: INR: 2.6

## 2012-03-07 ENCOUNTER — Other Ambulatory Visit: Payer: Self-pay | Admitting: Internal Medicine

## 2012-04-01 ENCOUNTER — Ambulatory Visit (INDEPENDENT_AMBULATORY_CARE_PROVIDER_SITE_OTHER): Payer: Medicare Other | Admitting: *Deleted

## 2012-04-01 DIAGNOSIS — Z7901 Long term (current) use of anticoagulants: Secondary | ICD-10-CM

## 2012-04-01 DIAGNOSIS — I2699 Other pulmonary embolism without acute cor pulmonale: Secondary | ICD-10-CM

## 2012-04-01 DIAGNOSIS — I82409 Acute embolism and thrombosis of unspecified deep veins of unspecified lower extremity: Secondary | ICD-10-CM

## 2012-04-09 ENCOUNTER — Other Ambulatory Visit: Payer: Self-pay | Admitting: Internal Medicine

## 2012-05-07 ENCOUNTER — Other Ambulatory Visit (INDEPENDENT_AMBULATORY_CARE_PROVIDER_SITE_OTHER): Payer: Medicare Other

## 2012-05-07 DIAGNOSIS — E7800 Pure hypercholesterolemia, unspecified: Secondary | ICD-10-CM

## 2012-05-07 DIAGNOSIS — R7309 Other abnormal glucose: Secondary | ICD-10-CM

## 2012-05-07 LAB — BASIC METABOLIC PANEL
BUN: 14 mg/dL (ref 6–23)
Calcium: 9 mg/dL (ref 8.4–10.5)
Creatinine, Ser: 1 mg/dL (ref 0.4–1.5)

## 2012-05-07 LAB — HEPATIC FUNCTION PANEL
Bilirubin, Direct: 0.1 mg/dL (ref 0.0–0.3)
Total Bilirubin: 0.8 mg/dL (ref 0.3–1.2)
Total Protein: 7.2 g/dL (ref 6.0–8.3)

## 2012-05-07 LAB — LIPID PANEL
Cholesterol: 109 mg/dL (ref 0–200)
HDL: 38.2 mg/dL — ABNORMAL LOW (ref >39.00)
LDL Cholesterol: 60 mg/dL (ref 0–99)
Total CHOL/HDL Ratio: 3
Triglycerides: 54 mg/dL (ref 0.0–149.0)

## 2012-05-08 ENCOUNTER — Other Ambulatory Visit: Payer: Self-pay | Admitting: Internal Medicine

## 2012-05-12 ENCOUNTER — Encounter: Payer: Self-pay | Admitting: Internal Medicine

## 2012-05-12 ENCOUNTER — Ambulatory Visit (INDEPENDENT_AMBULATORY_CARE_PROVIDER_SITE_OTHER): Payer: Medicare Other | Admitting: Internal Medicine

## 2012-05-12 VITALS — BP 120/84 | HR 84 | Temp 97.5°F | Ht 69.0 in | Wt 207.0 lb

## 2012-05-12 DIAGNOSIS — R7302 Impaired glucose tolerance (oral): Secondary | ICD-10-CM

## 2012-05-12 DIAGNOSIS — R609 Edema, unspecified: Secondary | ICD-10-CM

## 2012-05-12 DIAGNOSIS — I10 Essential (primary) hypertension: Secondary | ICD-10-CM

## 2012-05-12 DIAGNOSIS — Z0000 Encounter for general adult medical examination without abnormal findings: Secondary | ICD-10-CM

## 2012-05-12 DIAGNOSIS — R7309 Other abnormal glucose: Secondary | ICD-10-CM

## 2012-05-12 DIAGNOSIS — E7800 Pure hypercholesterolemia, unspecified: Secondary | ICD-10-CM

## 2012-05-12 NOTE — Assessment & Plan Note (Signed)
stable overall by history and exam, recent data reviewed with pt, and pt to continue medical treatment as before,  to f/u any worsening symptoms or concerns Lab Results  Component Value Date   HGBA1C 5.4 05/07/2012

## 2012-05-12 NOTE — Patient Instructions (Addendum)
Please continue all other medications as before, and refills have been done if requested. Please use the prescription for the compression stocking for the right leg Please keep the right leg elevated while sitting, and low salt diet Thank you for enrolling in MyChart. Please follow the instructions below to securely access your online medical record. MyChart allows you to send messages to your doctor, view your test results, renew your prescriptions, schedule appointments, and more. To Log into My Chart online, please go by [employer] or [employer] to [email or web address], or download the MyChart App from the [employer] of [employer] .  Your Username is: [email or web address] Please send a practice Message on Mychart later today. Please return in 6 months, or sooner if needed, with Lab testing done 3-5 days before

## 2012-05-12 NOTE — Assessment & Plan Note (Signed)
stable overall by history and exam, recent data reviewed with pt, and pt to continue medical treatment as before,  to f/u any worsening symptoms or concerns Lab Results  Component Value Date   LDLCALC 60 05/07/2012

## 2012-05-12 NOTE — Assessment & Plan Note (Signed)
stable overall by history and exam, recent data reviewed with pt, and pt to continue medical treatment as before,  to f/u any worsening symptoms or concerns BP Readings from Last 3 Encounters:  05/12/12 120/84  01/28/12 112/60  12/06/11 150/62

## 2012-05-12 NOTE — Progress Notes (Signed)
Subjective:    Patient ID: Kevin Hale, male    DOB: 08-11-1940, 72 y.o.   MRN: [id number]  HPI  Here to f/u; overall doing ok,  Pt denies chest pain, increased sob or doe, wheezing, orthopnea, PND, increased LE swelling, palpitations, dizziness or syncope but does have persistent recurring mild RLE swelling later in the day with mild discomfort, worse to stand more during the day.  Pt denies polydipsia, polyuria, or low sugar symptoms such as weakness or confusion improved with po intake.  Pt denies new neurological symptoms such as new headache, or facial or extremity weakness or numbness.   Pt states overall good compliance with meds, has been trying to follow lower cholesterol diet, with wt overall stable.  No overt bleeding or bruising.   Past Medical History  Diagnosis Date  . History of DVT (deep vein thrombosis)   . History of colon polyps   . BPH (benign prostatic hyperplasia)   . Hypercholesteremia   . HTN (hypertension)   . Diverticulosis of colon   . DJD (degenerative joint disease)     left foot, "pt denies"  . Cancer of prostate   . Plantar fasciitis   . Glucose intolerance (impaired glucose tolerance) 11/03/2010  . Anxiety   . Lumbar degenerative disc disease 11/12/2011  . Atherosclerosis of abdominal aorta 11/12/2011   Past Surgical History  Procedure Laterality Date  . Uvulectomy    . Insertion prostate radiation seed      reports that he has quit smoking. He has never used smokeless tobacco. He reports that he does not drink alcohol or use illicit drugs. family history includes Deep vein thrombosis in his mother and Diabetes in his mother. No Known Allergies Current Outpatient Prescriptions on File Prior to Visit  Medication Sig Dispense Refill  . allopurinol (ZYLOPRIM) 100 MG tablet TAKE ONE TABLET BY MOUTH DAILY  90 tablet  2  . lisinopril (PRINIVIL,ZESTRIL) 5 MG tablet TAKE ONE TABLET BY MOUTH EVERY DAY  30 tablet  0  . Multiple Vitamin (MULTIVITAMIN) capsule Take  1 capsule by mouth daily.      . pravastatin (PRAVACHOL) 20 MG tablet Take 1 tablet (20 mg total) by mouth daily.  90 tablet  3  . warfarin (COUMADIN) 5 MG tablet TAKE AS DIRECTED BY ANTICOAGULATION CLINIC. PT TAKES UP TO 1 AND 1/2 TABLETS DAILY.  45 tablet  3   No current facility-administered medications on file prior to visit.     Review of Systems  Constitutional: Negative for unexpected weight change, or unusual diaphoresis  HENT: Negative for tinnitus.   Eyes: Negative for photophobia and visual disturbance.  Respiratory: Negative for choking and stridor.   Gastrointestinal: Negative for vomiting and blood in stool.  Genitourinary: Negative for hematuria and decreased urine volume.  Musculoskeletal: Negative for acute joint swelling Skin: Negative for color change and wound.  Neurological: Negative for tremors and numbness other than noted  Psychiatric/Behavioral: Negative for decreased concentration or  hyperactivity.       Objective:   Physical Exam BP 120/84  Pulse 84  Temp(Src) 97.5 F (36.4 C) (Oral)  Ht 5\' 9"  (1.753 m)  Wt 207 lb (93.895 kg)  BMI 30.55 kg/m2  SpO2 98% VS noted,  Constitutional: Pt appears well-developed and well-nourished.  HENT: Head: NCAT.  Right Ear: External ear normal.  Left Ear: External ear normal.  Eyes: Conjunctivae and EOM are normal. Pupils are equal, round, and reactive to light.  Neck: Normal range of motion.  Neck supple.  Cardiovascular: Normal rate and regular rhythm.   Pulmonary/Chest: Effort normal and breath sounds normal.  Abd:  Soft, NT, non-distended, + BS Neurological: Pt is alert. Not confused  Skin: RLE with trace edema, no rash or ulcer Psychiatric: Pt behavior is normal. Thought content normal.     Assessment & Plan:

## 2012-05-12 NOTE — Assessment & Plan Note (Signed)
Mild recurrent, for compression stocking, let elevation, low salt diet.  to f/u any worsening symptoms or concerns

## 2012-05-13 ENCOUNTER — Ambulatory Visit (INDEPENDENT_AMBULATORY_CARE_PROVIDER_SITE_OTHER): Payer: Medicare Other | Admitting: *Deleted

## 2012-05-13 LAB — POCT INR: INR: 2.1

## 2012-06-08 ENCOUNTER — Other Ambulatory Visit: Payer: Self-pay | Admitting: Internal Medicine

## 2012-06-24 ENCOUNTER — Ambulatory Visit (INDEPENDENT_AMBULATORY_CARE_PROVIDER_SITE_OTHER): Payer: Medicare Other | Admitting: *Deleted

## 2012-06-24 DIAGNOSIS — I82409 Acute embolism and thrombosis of unspecified deep veins of unspecified lower extremity: Secondary | ICD-10-CM

## 2012-06-24 DIAGNOSIS — I2699 Other pulmonary embolism without acute cor pulmonale: Secondary | ICD-10-CM

## 2012-06-24 DIAGNOSIS — Z7901 Long term (current) use of anticoagulants: Secondary | ICD-10-CM

## 2012-06-24 MED ORDER — WARFARIN SODIUM 5 MG PO TABS: ORAL_TABLET | ORAL | Status: DC

## 2012-07-29 ENCOUNTER — Encounter: Payer: Self-pay | Admitting: Internal Medicine

## 2012-07-29 ENCOUNTER — Ambulatory Visit (INDEPENDENT_AMBULATORY_CARE_PROVIDER_SITE_OTHER): Payer: Medicare Other | Admitting: Internal Medicine

## 2012-07-29 ENCOUNTER — Ambulatory Visit (INDEPENDENT_AMBULATORY_CARE_PROVIDER_SITE_OTHER)
Admission: RE | Admit: 2012-07-29 | Discharge: 2012-07-29 | Disposition: A | Discharge status: Home / Self Care | Payer: Medicare Other | Source: Ambulatory Visit | Attending: Internal Medicine | Admitting: Internal Medicine

## 2012-07-29 VITALS — BP 140/78 | HR 68 | Temp 100.6°F | Ht 69.0 in | Wt 195.5 lb

## 2012-07-29 DIAGNOSIS — R7302 Impaired glucose tolerance (oral): Secondary | ICD-10-CM

## 2012-07-29 DIAGNOSIS — J189 Pneumonia, unspecified organism: Secondary | ICD-10-CM

## 2012-07-29 DIAGNOSIS — I10 Essential (primary) hypertension: Secondary | ICD-10-CM

## 2012-07-29 DIAGNOSIS — R7309 Other abnormal glucose: Secondary | ICD-10-CM

## 2012-07-29 MED ORDER — LEVOFLOXACIN 250 MG PO TABS: 250.0000 mg | ORAL_TABLET | Freq: Every day | ORAL | Status: DC

## 2012-07-29 MED ORDER — CEFTRIAXONE SODIUM 1 G IJ SOLR
1.0000 g | Freq: Once | INTRAMUSCULAR | Status: AC
  Administered 2012-07-29: 1 g via INTRAMUSCULAR

## 2012-07-29 NOTE — Assessment & Plan Note (Signed)
Clinical dx, for rocephin IM, levaquin 250 qd for 10 days, declines other cough med than OTC, for cxr today

## 2012-07-29 NOTE — Assessment & Plan Note (Signed)
stable overall by history and exam, recent data reviewed with pt, and pt to continue medical treatment as before,  to f/u any worsening symptoms or concerns BP Readings from Last 3 Encounters:  07/29/12 140/78  05/12/12 120/84  01/28/12 112/60

## 2012-07-29 NOTE — Assessment & Plan Note (Signed)
stable overall by history and exam, recent data reviewed with pt, and pt to continue medical treatment as before,  to f/u any worsening symptoms or concerns Lab Results  Component Value Date   HGBA1C 5.4 05/07/2012    

## 2012-07-29 NOTE — Patient Instructions (Signed)
You had the antibiotic shot today (rocephin) Please take all new medication as prescribed  - the pill antibiotic (levaquin) - you may want to take this with food as it can cause slight nausea with some people Please continue all other medications as before Please go to the XRAY Department in the Basement (go straight as you get off the elevator) for the x-ray testing  Thank you for enrolling in MyChart. Please follow the instructions below to securely access your online medical record. MyChart allows you to send messages to your doctor, view your test results, renew your prescriptions, schedule appointments, and more.

## 2012-07-29 NOTE — Progress Notes (Signed)
  Subjective:    Patient ID: Kevin Hale, male    DOB: 1940/09/24, 72 y.o.   MRN: [id number]  HPI here with acute osnet 2-3 days fever, general weakness malaise, prod cough, and chest congestion without HA, ST, sinus pain, CP, sob/doe, chills.Nothing seems to make better or worse. Pt denies new neurological symptoms such as new headache, or facial or extremity weakness or numbness   Pt denies polydipsia, polyuria, Pt states overall good compliance with meds,  Past Medical History  Diagnosis Date  . History of DVT (deep vein thrombosis)   . History of colon polyps   . BPH (benign prostatic hyperplasia)   . Hypercholesteremia   . HTN (hypertension)   . Diverticulosis of colon   . DJD (degenerative joint disease)     left foot, "pt denies"  . Cancer of prostate   . Plantar fasciitis   . Glucose intolerance (impaired glucose tolerance) 11/03/2010  . Anxiety   . Lumbar degenerative disc disease 11/12/2011  . Atherosclerosis of abdominal aorta 11/12/2011   Past Surgical History  Procedure Laterality Date  . Uvulectomy    . Insertion prostate radiation seed      reports that he has quit smoking. He has never used smokeless tobacco. He reports that he does not drink alcohol or use illicit drugs. family history includes Deep vein thrombosis in his mother and Diabetes in his mother. No Known Allergies Current Outpatient Prescriptions on File Prior to Visit  Medication Sig Dispense Refill  . allopurinol (ZYLOPRIM) 100 MG tablet TAKE ONE TABLET BY MOUTH DAILY  90 tablet  2  . lisinopril (PRINIVIL,ZESTRIL) 5 MG tablet TAKE ONE TABLET BY MOUTH ONCE DAILY  30 tablet  5  . Multiple Vitamin (MULTIVITAMIN) capsule Take 1 capsule by mouth daily.      . pravastatin (PRAVACHOL) 20 MG tablet Take 1 tablet (20 mg total) by mouth daily.  90 tablet  3  . warfarin (COUMADIN) 5 MG tablet Take as directed by coumadin clinic  120 tablet  1   No current facility-administered medications on file prior to visit.    Review of Systems  Constitutional: Negative for unexpected weight change, or unusual diaphoresis  HENT: Negative for tinnitus.   Eyes: Negative for photophobia and visual disturbance.  Respiratory: Negative for choking and stridor.   Gastrointestinal: Negative for vomiting and blood in stool.  Genitourinary: Negative for hematuria and decreased urine volume.  Musculoskeletal: Negative for acute joint swelling Skin: Negative for color change and wound.  Neurological: Negative for tremors and numbness other than noted  Psychiatric/Behavioral: Negative for decreased concentration or  hyperactivity.       Objective:   Physical Exam BP 140/78  Pulse 68  Temp(Src) 100.6 F (38.1 C) (Oral)  Ht 5\' 9"  (1.753 m)  Wt 195 lb 8 oz (88.678 kg)  BMI 28.86 kg/m2  SpO2 97% VS noted,  Constitutional: Pt appears well-developed and well-nourished.  HENT: Head: NCAT.  Right Ear: External ear normal.  Left Ear: External ear normal.  Eyes: Conjunctivae and EOM are normal. Pupils are equal, round, and reactive to light.  Neck: Normal range of motion. Neck supple.  Cardiovascular: Normal rate and regular rhythm.   Pulmonary/Chest: Effort normal and breath sounds course with left chest rales, no wheezing  Neurological: Pt is alert. Not confused  Skin: Skin is warm. No erythema.  Psychiatric: Pt behavior is normal. Thought content normal.     Assessment & Plan:

## 2012-08-05 ENCOUNTER — Ambulatory Visit (INDEPENDENT_AMBULATORY_CARE_PROVIDER_SITE_OTHER): Payer: Medicare Other | Admitting: Pharmacist

## 2012-08-05 DIAGNOSIS — I2699 Other pulmonary embolism without acute cor pulmonale: Secondary | ICD-10-CM

## 2012-08-05 DIAGNOSIS — I82409 Acute embolism and thrombosis of unspecified deep veins of unspecified lower extremity: Secondary | ICD-10-CM

## 2012-08-05 DIAGNOSIS — Z7901 Long term (current) use of anticoagulants: Secondary | ICD-10-CM

## 2012-09-16 ENCOUNTER — Ambulatory Visit (INDEPENDENT_AMBULATORY_CARE_PROVIDER_SITE_OTHER): Payer: Medicare Other | Admitting: *Deleted

## 2012-09-16 DIAGNOSIS — Z7901 Long term (current) use of anticoagulants: Secondary | ICD-10-CM

## 2012-09-16 DIAGNOSIS — I2699 Other pulmonary embolism without acute cor pulmonale: Secondary | ICD-10-CM

## 2012-09-16 DIAGNOSIS — I82409 Acute embolism and thrombosis of unspecified deep veins of unspecified lower extremity: Secondary | ICD-10-CM

## 2012-09-16 LAB — POCT INR: INR: 3.1

## 2012-10-21 ENCOUNTER — Ambulatory Visit (INDEPENDENT_AMBULATORY_CARE_PROVIDER_SITE_OTHER): Payer: Medicare Other | Admitting: Pharmacist

## 2012-10-21 DIAGNOSIS — I2699 Other pulmonary embolism without acute cor pulmonale: Secondary | ICD-10-CM

## 2012-10-21 DIAGNOSIS — I82409 Acute embolism and thrombosis of unspecified deep veins of unspecified lower extremity: Secondary | ICD-10-CM

## 2012-10-21 DIAGNOSIS — Z7901 Long term (current) use of anticoagulants: Secondary | ICD-10-CM

## 2012-10-21 LAB — POCT INR: INR: 3.5

## 2012-11-05 ENCOUNTER — Other Ambulatory Visit (INDEPENDENT_AMBULATORY_CARE_PROVIDER_SITE_OTHER): Payer: Medicare Other

## 2012-11-05 DIAGNOSIS — Z0000 Encounter for general adult medical examination without abnormal findings: Secondary | ICD-10-CM

## 2012-11-05 DIAGNOSIS — R7302 Impaired glucose tolerance (oral): Secondary | ICD-10-CM

## 2012-11-05 DIAGNOSIS — I10 Essential (primary) hypertension: Secondary | ICD-10-CM

## 2012-11-05 DIAGNOSIS — Z8546 Personal history of malignant neoplasm of prostate: Secondary | ICD-10-CM

## 2012-11-05 DIAGNOSIS — R7309 Other abnormal glucose: Secondary | ICD-10-CM

## 2012-11-05 DIAGNOSIS — E7800 Pure hypercholesterolemia, unspecified: Secondary | ICD-10-CM

## 2012-11-05 LAB — URINALYSIS, ROUTINE W REFLEX MICROSCOPIC
Ketones, ur: NEGATIVE
Leukocytes, UA: NEGATIVE
RBC / HPF: NONE SEEN (ref 0–?)
Specific Gravity, Urine: 1.02 (ref 1.000–1.030)
Total Protein, Urine: NEGATIVE
Urine Glucose: NEGATIVE
pH: 6.5 (ref 5.0–8.0)

## 2012-11-05 LAB — CBC WITH DIFFERENTIAL/PLATELET
Basophils Relative: 0.3 % (ref 0.0–3.0)
Eosinophils Absolute: 0.2 10*3/uL (ref 0.0–0.7)
Lymphocytes Relative: 44.2 % (ref 12.0–46.0)
MCHC: 33 g/dL (ref 30.0–36.0)
Neutrophils Relative %: 44 % (ref 43.0–77.0)
RBC: 5.65 Mil/uL (ref 4.22–5.81)
WBC: 8.1 10*3/uL (ref 4.5–10.5)

## 2012-11-05 LAB — BASIC METABOLIC PANEL
Calcium: 9.5 mg/dL (ref 8.4–10.5)
GFR: 89.05 mL/min (ref >60.00)
Sodium: 138 mEq/L (ref 135–145)

## 2012-11-05 LAB — HEPATIC FUNCTION PANEL
Alkaline Phosphatase: 69 U/L (ref 39–117)
Bilirubin, Direct: 0.1 mg/dL (ref 0.0–0.3)
Total Bilirubin: 0.7 mg/dL (ref 0.3–1.2)

## 2012-11-05 LAB — LIPID PANEL
HDL: 40.4 mg/dL (ref >39.00)
Total CHOL/HDL Ratio: 3
VLDL: 23 mg/dL (ref 0.0–40.0)

## 2012-11-11 ENCOUNTER — Ambulatory Visit (INDEPENDENT_AMBULATORY_CARE_PROVIDER_SITE_OTHER): Payer: Medicare Other | Admitting: Internal Medicine

## 2012-11-11 ENCOUNTER — Encounter: Payer: Self-pay | Admitting: Internal Medicine

## 2012-11-11 ENCOUNTER — Ambulatory Visit (INDEPENDENT_AMBULATORY_CARE_PROVIDER_SITE_OTHER): Payer: Medicare Other

## 2012-11-11 VITALS — BP 110/78 | HR 64 | Temp 97.9°F | Ht 69.0 in | Wt 204.0 lb

## 2012-11-11 DIAGNOSIS — I82409 Acute embolism and thrombosis of unspecified deep veins of unspecified lower extremity: Secondary | ICD-10-CM

## 2012-11-11 DIAGNOSIS — Z0000 Encounter for general adult medical examination without abnormal findings: Secondary | ICD-10-CM

## 2012-11-11 DIAGNOSIS — R7309 Other abnormal glucose: Secondary | ICD-10-CM

## 2012-11-11 DIAGNOSIS — R7302 Impaired glucose tolerance (oral): Secondary | ICD-10-CM

## 2012-11-11 DIAGNOSIS — Z7901 Long term (current) use of anticoagulants: Secondary | ICD-10-CM

## 2012-11-11 DIAGNOSIS — Z23 Encounter for immunization: Secondary | ICD-10-CM

## 2012-11-11 DIAGNOSIS — I2699 Other pulmonary embolism without acute cor pulmonale: Secondary | ICD-10-CM

## 2012-11-11 LAB — POCT INR: INR: 3.1

## 2012-11-11 NOTE — Assessment & Plan Note (Signed)
stable overall by history and exam, recent data reviewed with pt, and pt to continue medical treatment as before,  to f/u any worsening symptoms or concerns Lab Results  Component Value Date   HGBA1C 5.6 11/05/2012

## 2012-11-11 NOTE — Patient Instructions (Signed)
You had the Prevnar shot, and the flu shot today Please continue all other medications as before, and refills have been done if requested. Please have the pharmacy call with any other refills you may need. Please continue your efforts at being more active, low cholesterol diet, and weight control. You are otherwise up to date with prevention measures today. You are given the lab copy today - all labs OK Please keep your appointments with your specialists as you may have planned  Please remember to sign up for My Chart if you have not done so, as this will be important to you in the future with finding out test results, communicating by private email, and scheduling acute appointments online when needed.  Please return in 1 year for your yearly visit, or sooner if needed, with Lab testing done 3-5 days before

## 2012-11-11 NOTE — Progress Notes (Signed)
Subjective:    Patient ID: Kevin Hale, male    DOB: September 09, 1940, 72 y.o.   MRN: [id number]  HPI  Here for wellness and f/u;  Overall doing ok;  Pt denies CP, worsening SOB, DOE, wheezing, orthopnea, PND, worsening LE edema, palpitations, dizziness or syncope.  Pt denies neurological change such as new headache, facial or extremity weakness.  Pt denies polydipsia, polyuria, or low sugar symptoms. Pt states overall good compliance with treatment and medications, good tolerability, and has been trying to follow lower cholesterol diet.  Pt denies worsening depressive symptoms, suicidal ideation or panic. No fever, night sweats, wt loss, loss of appetite, or other constitutional symptoms.  Pt states good ability with ADL's, has low fall risk, home safety reviewed and adequate, no other significant changes in hearing or vision, and only occasionally active with exercise. No acute complaints Past Medical History  Diagnosis Date  . History of DVT (deep vein thrombosis)   . History of colon polyps   . BPH (benign prostatic hyperplasia)   . Hypercholesteremia   . HTN (hypertension)   . Diverticulosis of colon   . DJD (degenerative joint disease)     left foot, "pt denies"  . Cancer of prostate   . Plantar fasciitis   . Glucose intolerance (impaired glucose tolerance) 11/03/2010  . Anxiety   . Lumbar degenerative disc disease 11/12/2011  . Atherosclerosis of abdominal aorta 11/12/2011   Past Surgical History  Procedure Laterality Date  . Uvulectomy    . Insertion prostate radiation seed      reports that he has quit smoking. He has never used smokeless tobacco. He reports that he does not drink alcohol or use illicit drugs. family history includes Deep vein thrombosis in his mother; Diabetes in his mother. No Known Allergies Current Outpatient Prescriptions on File Prior to Visit  Medication Sig Dispense Refill  . allopurinol (ZYLOPRIM) 100 MG tablet TAKE ONE TABLET BY MOUTH DAILY  90 tablet  2   . lisinopril (PRINIVIL,ZESTRIL) 5 MG tablet TAKE ONE TABLET BY MOUTH ONCE DAILY  30 tablet  5  . Multiple Vitamin (MULTIVITAMIN) capsule Take 1 capsule by mouth daily.      . pravastatin (PRAVACHOL) 20 MG tablet Take 1 tablet (20 mg total) by mouth daily.  90 tablet  3  . warfarin (COUMADIN) 5 MG tablet Take as directed by coumadin clinic  120 tablet  1   No current facility-administered medications on file prior to visit.   Review of Systems Constitutional: Negative for diaphoresis, activity change, appetite change or unexpected weight change.  HENT: Negative for hearing loss, ear pain, facial swelling, mouth sores and neck stiffness.   Eyes: Negative for pain, redness and visual disturbance.  Respiratory: Negative for shortness of breath and wheezing.   Cardiovascular: Negative for chest pain and palpitations.  Gastrointestinal: Negative for diarrhea, blood in stool, abdominal distention or other pain Genitourinary: Negative for hematuria, flank pain or change in urine volume.  Musculoskeletal: Negative for myalgias and joint swelling.  Skin: Negative for color change and wound.  Neurological: Negative for syncope and numbness. other than noted Hematological: Negative for adenopathy.  Psychiatric/Behavioral: Negative for hallucinations, self-injury, decreased concentration and agitation.      Objective:   Physical Exam BP 110/78  Pulse 64  Temp(Src) 97.9 F (36.6 C) (Oral)  Ht 5\' 9"  (1.753 m)  Wt 204 lb (92.534 kg)  BMI 30.11 kg/m2  SpO2 97% VS noted,  Constitutional: Pt is oriented to person, place,  and time. Appears well-developed and well-nourished.  Head: Normocephalic and atraumatic.  Right Ear: External ear normal.  Left Ear: External ear normal.  Nose: Nose normal.  Mouth/Throat: Oropharynx is clear and moist.  Eyes: Conjunctivae and EOM are normal. Pupils are equal, round, and reactive to light.  Neck: Normal range of motion. Neck supple. No JVD present. No  tracheal deviation present.  Cardiovascular: Normal rate, regular rhythm, normal heart sounds and intact distal pulses.   Pulmonary/Chest: Effort normal and breath sounds normal.  Abdominal: Soft. Bowel sounds are normal. There is no tenderness. No HSM  Musculoskeletal: Normal range of motion. Exhibits no edema.  Lymphadenopathy:  Has no cervical adenopathy.  Neurological: Pt is alert and oriented to person, place, and time. Pt has normal reflexes. No cranial nerve deficit.  Skin: Skin is warm and dry. No rash noted.  Psychiatric:  Has  normal mood and affect. Behavior is normal.     Assessment & Plan:

## 2012-11-11 NOTE — Assessment & Plan Note (Signed)

## 2012-12-02 ENCOUNTER — Ambulatory Visit (INDEPENDENT_AMBULATORY_CARE_PROVIDER_SITE_OTHER): Payer: Medicare Other | Admitting: *Deleted

## 2012-12-02 DIAGNOSIS — I82409 Acute embolism and thrombosis of unspecified deep veins of unspecified lower extremity: Secondary | ICD-10-CM

## 2012-12-02 DIAGNOSIS — Z7901 Long term (current) use of anticoagulants: Secondary | ICD-10-CM

## 2012-12-02 DIAGNOSIS — I2699 Other pulmonary embolism without acute cor pulmonale: Secondary | ICD-10-CM

## 2012-12-03 ENCOUNTER — Other Ambulatory Visit: Payer: Self-pay | Admitting: Internal Medicine

## 2012-12-08 ENCOUNTER — Other Ambulatory Visit: Payer: Self-pay | Admitting: Internal Medicine

## 2012-12-22 ENCOUNTER — Ambulatory Visit (INDEPENDENT_AMBULATORY_CARE_PROVIDER_SITE_OTHER): Payer: Medicare Other | Admitting: Internal Medicine

## 2012-12-22 ENCOUNTER — Encounter: Payer: Self-pay | Admitting: Internal Medicine

## 2012-12-22 VITALS — BP 142/88 | HR 79 | Ht 69.0 in | Wt 206.0 lb

## 2012-12-22 DIAGNOSIS — R079 Chest pain, unspecified: Secondary | ICD-10-CM

## 2012-12-22 NOTE — Patient Instructions (Signed)
Your physician wants you to follow-up in: ONE YEAR WITH DR Filbert Schilder will receive a reminder letter in the mail two months in advance. If you don't receive a letter, please call our office to schedule the follow-up appointment.   XARELTO OR ELIQUIS TO REPLACE WARFARIN

## 2012-12-22 NOTE — Progress Notes (Signed)
HPI Patient is a 72 year old with a history of chronic LE DVT and PE.  I saw him in October 2013.  Note echo in 2012 showed normal LV and RV function. No presyncope  No CP  Sometimes a little sob with stairs. Since seen he denies CP  Breathing is OK  No dizziness No Known Allergies  Current Outpatient Prescriptions  Medication Sig Dispense Refill  . allopurinol (ZYLOPRIM) 100 MG tablet TAKE ONE TABLET BY MOUTH DAILY  90 tablet  2  . lisinopril (PRINIVIL,ZESTRIL) 5 MG tablet Take 1 tablet (5 mg total) by mouth daily.  30 tablet  0  . Multiple Vitamin (MULTIVITAMIN) capsule Take 1 capsule by mouth daily.      . pravastatin (PRAVACHOL) 20 MG tablet TAKE ONE TABLET BY MOUTH DAILY  90 tablet  1  . warfarin (COUMADIN) 5 MG tablet Take as directed by coumadin clinic  120 tablet  1   No current facility-administered medications for this visit.    Past Medical History  Diagnosis Date  . History of DVT (deep vein thrombosis)   . History of colon polyps   . BPH (benign prostatic hyperplasia)   . Hypercholesteremia   . HTN (hypertension)   . Diverticulosis of colon   . DJD (degenerative joint disease)     left foot, "pt denies"  . Cancer of prostate   . Plantar fasciitis   . Glucose intolerance (impaired glucose tolerance) 11/03/2010  . Anxiety   . Lumbar degenerative disc disease 11/12/2011  . Atherosclerosis of abdominal aorta 11/12/2011    Past Surgical History  Procedure Laterality Date  . Uvulectomy    . Insertion prostate radiation seed      Family History  Problem Relation Age of Onset  . Deep vein thrombosis Mother   . Diabetes Mother     History   Social History  . Marital Status: Married    Spouse Name: N/A    Number of Children: 2  . Years of Education: N/A   Occupational History  . retired    Social History Main Topics  . Smoking status: Former Games developer  . Smokeless tobacco: Never Used  . Alcohol Use: No  . Drug Use: No  . Sexual Activity: Not Currently    Other Topics Concern  . Not on file   Social History Narrative  . No narrative on file    Review of Systems:  All systems reviewed.  They are negative to the above problem except as previously stated.  Vital Signs: BP 144/88   P 79  Wt 206 On my check  BP 142/84 Physical Exam Patinet in NAD  BP HEENT:  Normocephalic, atraumatic. EOMI, PERRLA.  Neck: JVP is normal.  No bruits.  Lungs: clear to auscultation. No rales no wheezes.  Heart: Regular rate and rhythm. Normal S1, S2. No S3.   No significant murmurs. PMI not displaced.  Abdomen:  Supple, nontender. Normal bowel sounds. No masses. No hepatomegaly.  Extremities:   Good distal pulses throughout. Tr lower extremity edema RLE Musculoskeletal :moving all extremities.  Neuro:   alert and oriented x3.  CN II-XII grossly intact.  NSR 79.   LAFB.  LVH with repol abnormality. Assessment and Plan: 1.  PE/DVT  COntinue lifelong anticoagulation  He will check on copay for Xarelto and Eliquis.  I will check with pharmacy.   2.  HTN  Lable.    Follow  3.  HL  Mild plaquing of aorta.  ON  pravachol  LIpids in September were excellent.  F/U in 1 year.  Encouraged him to increase active.  He wants to lose wt.

## 2012-12-31 ENCOUNTER — Ambulatory Visit (INDEPENDENT_AMBULATORY_CARE_PROVIDER_SITE_OTHER): Payer: Medicare Other | Admitting: *Deleted

## 2012-12-31 DIAGNOSIS — I2699 Other pulmonary embolism without acute cor pulmonale: Secondary | ICD-10-CM

## 2012-12-31 DIAGNOSIS — I82409 Acute embolism and thrombosis of unspecified deep veins of unspecified lower extremity: Secondary | ICD-10-CM

## 2012-12-31 DIAGNOSIS — Z7901 Long term (current) use of anticoagulants: Secondary | ICD-10-CM

## 2013-01-05 ENCOUNTER — Other Ambulatory Visit: Payer: Self-pay | Admitting: Internal Medicine

## 2013-01-28 ENCOUNTER — Ambulatory Visit (INDEPENDENT_AMBULATORY_CARE_PROVIDER_SITE_OTHER): Payer: Medicare Other | Admitting: *Deleted

## 2013-01-28 DIAGNOSIS — Z7901 Long term (current) use of anticoagulants: Secondary | ICD-10-CM

## 2013-01-28 DIAGNOSIS — I2699 Other pulmonary embolism without acute cor pulmonale: Secondary | ICD-10-CM

## 2013-01-28 DIAGNOSIS — I82409 Acute embolism and thrombosis of unspecified deep veins of unspecified lower extremity: Secondary | ICD-10-CM

## 2013-01-28 MED ORDER — WARFARIN SODIUM 5 MG PO TABS: ORAL_TABLET | ORAL | Status: DC

## 2013-02-25 ENCOUNTER — Ambulatory Visit (INDEPENDENT_AMBULATORY_CARE_PROVIDER_SITE_OTHER): Payer: Medicare Other | Admitting: Pharmacist

## 2013-02-25 DIAGNOSIS — Z7901 Long term (current) use of anticoagulants: Secondary | ICD-10-CM

## 2013-02-25 DIAGNOSIS — I82409 Acute embolism and thrombosis of unspecified deep veins of unspecified lower extremity: Secondary | ICD-10-CM

## 2013-02-25 DIAGNOSIS — I2699 Other pulmonary embolism without acute cor pulmonale: Secondary | ICD-10-CM

## 2013-02-25 LAB — POCT INR: INR: 2.2

## 2013-03-25 ENCOUNTER — Ambulatory Visit (INDEPENDENT_AMBULATORY_CARE_PROVIDER_SITE_OTHER): Payer: Medicare HMO | Admitting: Pharmacist

## 2013-03-25 DIAGNOSIS — I2699 Other pulmonary embolism without acute cor pulmonale: Secondary | ICD-10-CM

## 2013-03-25 DIAGNOSIS — Z7901 Long term (current) use of anticoagulants: Secondary | ICD-10-CM

## 2013-03-25 DIAGNOSIS — I82409 Acute embolism and thrombosis of unspecified deep veins of unspecified lower extremity: Secondary | ICD-10-CM

## 2013-03-25 DIAGNOSIS — Z5181 Encounter for therapeutic drug level monitoring: Secondary | ICD-10-CM | POA: Insufficient documentation

## 2013-03-25 LAB — POCT INR: INR: 3.2

## 2013-04-05 ENCOUNTER — Other Ambulatory Visit: Payer: Self-pay | Admitting: Internal Medicine

## 2013-04-24 ENCOUNTER — Ambulatory Visit (INDEPENDENT_AMBULATORY_CARE_PROVIDER_SITE_OTHER): Payer: Medicare HMO | Admitting: Pharmacist

## 2013-04-24 DIAGNOSIS — Z7901 Long term (current) use of anticoagulants: Secondary | ICD-10-CM

## 2013-04-24 DIAGNOSIS — I2699 Other pulmonary embolism without acute cor pulmonale: Secondary | ICD-10-CM

## 2013-04-24 DIAGNOSIS — Z5181 Encounter for therapeutic drug level monitoring: Secondary | ICD-10-CM

## 2013-04-24 DIAGNOSIS — I82409 Acute embolism and thrombosis of unspecified deep veins of unspecified lower extremity: Secondary | ICD-10-CM

## 2013-04-24 LAB — POCT INR: INR: 2.4

## 2013-05-19 ENCOUNTER — Encounter: Payer: Self-pay | Admitting: Internal Medicine

## 2013-05-26 ENCOUNTER — Ambulatory Visit (INDEPENDENT_AMBULATORY_CARE_PROVIDER_SITE_OTHER): Payer: Medicare HMO | Admitting: Pharmacist

## 2013-05-26 DIAGNOSIS — I2699 Other pulmonary embolism without acute cor pulmonale: Secondary | ICD-10-CM

## 2013-05-26 DIAGNOSIS — I82409 Acute embolism and thrombosis of unspecified deep veins of unspecified lower extremity: Secondary | ICD-10-CM

## 2013-05-26 DIAGNOSIS — Z5181 Encounter for therapeutic drug level monitoring: Secondary | ICD-10-CM

## 2013-05-26 DIAGNOSIS — Z7901 Long term (current) use of anticoagulants: Secondary | ICD-10-CM

## 2013-05-26 LAB — POCT INR: INR: 2.5

## 2013-06-03 ENCOUNTER — Encounter: Payer: Self-pay | Admitting: Family Medicine

## 2013-06-03 ENCOUNTER — Ambulatory Visit (INDEPENDENT_AMBULATORY_CARE_PROVIDER_SITE_OTHER): Payer: Medicare HMO | Admitting: Family Medicine

## 2013-06-03 VITALS — BP 116/77 | HR 78 | Temp 99.2°F | Resp 18 | Ht 69.0 in | Wt 198.0 lb

## 2013-06-03 DIAGNOSIS — J209 Acute bronchitis, unspecified: Secondary | ICD-10-CM

## 2013-06-03 DIAGNOSIS — J019 Acute sinusitis, unspecified: Secondary | ICD-10-CM

## 2013-06-03 DIAGNOSIS — J42 Unspecified chronic bronchitis: Secondary | ICD-10-CM | POA: Insufficient documentation

## 2013-06-03 MED ORDER — AMOXICILLIN-POT CLAVULANATE 875-125 MG PO TABS: 1.0000 | ORAL_TABLET | Freq: Two times a day (BID) | ORAL | Status: DC

## 2013-06-03 MED ORDER — PREDNISONE 20 MG PO TABS: ORAL_TABLET | ORAL | Status: DC

## 2013-06-03 NOTE — Progress Notes (Signed)
Pre visit review using our clinic review tool, if applicable. No additional management support is needed unless otherwise documented below in the visit note. 

## 2013-06-03 NOTE — Progress Notes (Signed)
OFFICE NOTE  06/03/2013  CC:  Chief Complaint  Patient presents with  . URI     HPI: Patient is a 73 y.o. African-American male who is here for respiratory symptoms. Onset of scratchy throat, runny nose, sneezing 3-4 wks ago, sx's waned some withh use of allegra and he still takes this.  Then in the last 3d he has begun to feel weak, achy in back, coughing--productive of green phlegm.  Head tense but no headache.  No fever.  No chest tightness, no SOB. He is not a smoker. He has tried mucinex.  Pertinent PMH:  Past Medical History  Diagnosis Date  . History of DVT (deep vein thrombosis)   . History of colon polyps   . BPH (benign prostatic hyperplasia)   . Hypercholesteremia   . HTN (hypertension)   . Diverticulosis of colon   . DJD (degenerative joint disease)     left foot, "pt denies"  . Cancer of prostate   . Plantar fasciitis   . Glucose intolerance (impaired glucose tolerance) 11/03/2010  . Anxiety   . Lumbar degenerative disc disease 11/12/2011  . Atherosclerosis of abdominal aorta 11/12/2011   Past Surgical History  Procedure Laterality Date  . Uvulectomy    . Insertion prostate radiation seed     MEDS:  Outpatient Prescriptions Prior to Visit  Medication Sig Dispense Refill  . allopurinol (ZYLOPRIM) 100 MG tablet TAKE ONE TABLET BY MOUTH ONCE DAILY  90 tablet  3  . lisinopril (PRINIVIL,ZESTRIL) 5 MG tablet TAKE ONE TABLET BY MOUTH ONCE DAILY  30 tablet  6  . Multiple Vitamin (MULTIVITAMIN) capsule Take 1 capsule by mouth daily.      . pravastatin (PRAVACHOL) 20 MG tablet TAKE ONE TABLET BY MOUTH DAILY  90 tablet  1  . warfarin (COUMADIN) 5 MG tablet Take as directed by coumadin clinic  120 tablet  1   No facility-administered medications prior to visit.    PE: Blood pressure 116/77, pulse 78, temperature 99.2 F (37.3 C), temperature source Temporal, resp. rate 18, height 5\' 9"  (1.753 m), weight 198 lb (89.812 kg), SpO2 99.00%. VS: noted--normal. Gen: alert,  NAD, NONTOXIC APPEARING. HEENT: eyes without injection, drainage, or swelling.  Ears: EACs clear, TMs with normal light reflex and landmarks.  Nose: Clear rhinorrhea, with some dried, crusty exudate adherent to mildly injected mucosa.  No purulent d/c.  No paranasal sinus TTP.  No facial swelling.  Throat and mouth without focal lesion.  No pharyngial swelling, erythema, or exudate.   Neck: supple, no LAD.   LUNGS: CTA bilat except some scattered insp rhonchi that go away with forceful coughing, nonlabored resps.   CV: RRR, no m/r/g. EXT: no c/c/e SKIN: no rash  IMPRESSION AND PLAN:  Acute sinusitis with signs of acute bronchitis as well. Will give prednisone 40mg  qd x 5d and augmentin 875mg  bid x 10d. Continue mucinex and allegra. Saline nasal spray recommended.   FOLLOW UP: 5-7 d with Dr. Jenny Reichmann or myself if Dr. Jenny Reichmann is not available

## 2013-06-10 ENCOUNTER — Other Ambulatory Visit: Payer: Self-pay | Admitting: Internal Medicine

## 2013-06-10 ENCOUNTER — Encounter: Payer: Self-pay | Admitting: Internal Medicine

## 2013-06-10 ENCOUNTER — Telehealth: Payer: Self-pay | Admitting: Internal Medicine

## 2013-06-10 ENCOUNTER — Ambulatory Visit (INDEPENDENT_AMBULATORY_CARE_PROVIDER_SITE_OTHER): Payer: Medicare HMO | Admitting: Internal Medicine

## 2013-06-10 VITALS — BP 110/72 | HR 74 | Temp 98.1°F | Ht 69.0 in | Wt 201.2 lb

## 2013-06-10 DIAGNOSIS — J019 Acute sinusitis, unspecified: Secondary | ICD-10-CM

## 2013-06-10 DIAGNOSIS — R7309 Other abnormal glucose: Secondary | ICD-10-CM

## 2013-06-10 DIAGNOSIS — J209 Acute bronchitis, unspecified: Secondary | ICD-10-CM

## 2013-06-10 DIAGNOSIS — I10 Essential (primary) hypertension: Secondary | ICD-10-CM

## 2013-06-10 DIAGNOSIS — R7302 Impaired glucose tolerance (oral): Secondary | ICD-10-CM

## 2013-06-10 NOTE — Assessment & Plan Note (Signed)
Improved, finish current tx, no need cxr or change in tx, for delsym for residual cough prn

## 2013-06-10 NOTE — Progress Notes (Signed)
Subjective:    Patient ID: Kevin Hale, male    DOB: 06-13-1940, 73 y.o.   MRN: [id number]  HPI  Here to f/u, has hx of pna June 2014 RLL, was seen and tx for sinusitis/bronchitis with augmentin/prednisone apr 8.  Overall improved with no fever, cough production much decreased in volume and color now more clear and Pt denies chest pain, increased sob or doe, wheezing, orthopnea, PND, increased LE swelling, palpitations, dizziness or syncope.  Still has some occas cough that he is concerned with.  Pt denies polydipsia, polyuria,   Past Medical History  Diagnosis Date  . History of DVT (deep vein thrombosis)   . History of colon polyps   . BPH (benign prostatic hyperplasia)   . Hypercholesteremia   . HTN (hypertension)   . Diverticulosis of colon   . DJD (degenerative joint disease)     left foot, "pt denies"  . Cancer of prostate   . Plantar fasciitis   . Glucose intolerance (impaired glucose tolerance) 11/03/2010  . Anxiety   . Lumbar degenerative disc disease 11/12/2011  . Atherosclerosis of abdominal aorta 11/12/2011   Past Surgical History  Procedure Laterality Date  . Uvulectomy    . Insertion prostate radiation seed      reports that he has quit smoking. He has never used smokeless tobacco. He reports that he does not drink alcohol or use illicit drugs. family history includes Deep vein thrombosis in his mother; Diabetes in his mother. No Known Allergies Current Outpatient Prescriptions on File Prior to Visit  Medication Sig Dispense Refill  . allopurinol (ZYLOPRIM) 100 MG tablet TAKE ONE TABLET BY MOUTH ONCE DAILY  90 tablet  3  . amoxicillin-clavulanate (AUGMENTIN) 875-125 MG per tablet Take 1 tablet by mouth 2 (two) times daily.  20 tablet  0  . lisinopril (PRINIVIL,ZESTRIL) 5 MG tablet TAKE ONE TABLET BY MOUTH ONCE DAILY  30 tablet  6  . Multiple Vitamin (MULTIVITAMIN) capsule Take 1 capsule by mouth daily.      . pravastatin (PRAVACHOL) 20 MG tablet TAKE ONE TABLET BY  MOUTH DAILY  90 tablet  1  . predniSONE (DELTASONE) 20 MG tablet 2 tabs po qd x 5d  10 tablet  0  . warfarin (COUMADIN) 5 MG tablet Take as directed by coumadin clinic  120 tablet  1   No current facility-administered medications on file prior to visit.   Review of Systems  Constitutional: Negative for unexpected weight change, or unusual diaphoresis  HENT: Negative for tinnitus.   Eyes: Negative for photophobia and visual disturbance.  Respiratory: Negative for choking and stridor.   Gastrointestinal: Negative for vomiting and blood in stool.  Genitourinary: Negative for hematuria and decreased urine volume.  Musculoskeletal: Negative for acute joint swelling Skin: Negative for color change and wound.  Neurological: Negative for tremors and numbness other than noted  Psychiatric/Behavioral: Negative for decreased concentration or  hyperactivity.       Objective:   Physical Exam BP 110/72  Pulse 74  Temp(Src) 98.1 F (36.7 C) (Oral)  Ht 5\' 9"  (1.753 m)  Wt 201 lb 4 oz (91.286 kg)  BMI 29.71 kg/m2  SpO2 97% VS noted,  Constitutional: Pt appears well-developed and well-nourished.  HENT: Head: NCAT.  Right Ear: External ear normal.  Left Ear: External ear normal.  Bilat tm's with no erythema.  Max sinus areas no tender.  Pharynx with mild erythema, no exudate Eyes: Conjunctivae and EOM are normal. Pupils are equal, round,  and reactive to light.  Neck: Normal range of motion. Neck supple.  Cardiovascular: Normal rate and regular rhythm.   Pulmonary/Chest: Effort normal and breath sounds normal.  - no rales or wheezing Neurological: Pt is alert. Not confused  Skin: Skin is warm. No erythema.  No LE edema Psychiatric: Pt behavior is normal. Thought content normal.     Assessment & Plan:

## 2013-06-10 NOTE — Patient Instructions (Addendum)
You are improving and nearly over the sinusitis and bronchitis.    Please finish your current medication, and remember the cough itself can go on for 1 or more weeks  You can also take Delsym OTC for cough, and/or Mucinex (or it's generic off brand) for congestion, and tylenol as needed for pain.

## 2013-06-10 NOTE — Telephone Encounter (Signed)
Relevant patient education assigned to patient using Emmi. ° °

## 2013-06-10 NOTE — Assessment & Plan Note (Signed)
Improved, no further tx needed, can use mucinex otc prn

## 2013-06-10 NOTE — Assessment & Plan Note (Signed)
stable overall by history and exam, recent data reviewed with pt, and pt to continue medical treatment as before,  to f/u any worsening symptoms or concerns Lab Results  Component Value Date   HGBA1C 5.6 11/05/2012   For lab next visit

## 2013-06-10 NOTE — Assessment & Plan Note (Signed)
stable overall by history and exam, recent data reviewed with pt, and pt to continue medical treatment as before,  to f/u any worsening symptoms or concerns BP Readings from Last 3 Encounters:  06/10/13 110/72  06/03/13 116/77  12/22/12 142/88

## 2013-06-10 NOTE — Progress Notes (Signed)
Pre visit review using our clinic review tool, if applicable. No additional management support is needed unless otherwise documented below in the visit note. 

## 2013-06-30 ENCOUNTER — Ambulatory Visit (INDEPENDENT_AMBULATORY_CARE_PROVIDER_SITE_OTHER): Payer: Medicare HMO | Admitting: *Deleted

## 2013-06-30 DIAGNOSIS — Z5181 Encounter for therapeutic drug level monitoring: Secondary | ICD-10-CM

## 2013-06-30 DIAGNOSIS — I82409 Acute embolism and thrombosis of unspecified deep veins of unspecified lower extremity: Secondary | ICD-10-CM

## 2013-06-30 DIAGNOSIS — Z7901 Long term (current) use of anticoagulants: Secondary | ICD-10-CM

## 2013-06-30 DIAGNOSIS — I2699 Other pulmonary embolism without acute cor pulmonale: Secondary | ICD-10-CM

## 2013-06-30 LAB — POCT INR: INR: 2.4

## 2013-08-11 ENCOUNTER — Ambulatory Visit (INDEPENDENT_AMBULATORY_CARE_PROVIDER_SITE_OTHER): Payer: Medicare HMO | Admitting: Pharmacist

## 2013-08-11 DIAGNOSIS — I82409 Acute embolism and thrombosis of unspecified deep veins of unspecified lower extremity: Secondary | ICD-10-CM

## 2013-08-11 DIAGNOSIS — Z7901 Long term (current) use of anticoagulants: Secondary | ICD-10-CM

## 2013-08-11 DIAGNOSIS — Z5181 Encounter for therapeutic drug level monitoring: Secondary | ICD-10-CM

## 2013-08-11 DIAGNOSIS — I2699 Other pulmonary embolism without acute cor pulmonale: Secondary | ICD-10-CM

## 2013-08-11 LAB — POCT INR: INR: 2.4

## 2013-08-17 ENCOUNTER — Other Ambulatory Visit: Payer: Self-pay | Admitting: Internal Medicine

## 2013-09-10 ENCOUNTER — Other Ambulatory Visit: Payer: Self-pay

## 2013-09-10 MED ORDER — LISINOPRIL 5 MG PO TABS: ORAL_TABLET | ORAL | Status: DC

## 2013-09-22 ENCOUNTER — Ambulatory Visit (INDEPENDENT_AMBULATORY_CARE_PROVIDER_SITE_OTHER): Payer: Medicare HMO | Admitting: Surgery

## 2013-09-22 ENCOUNTER — Encounter: Payer: Self-pay | Admitting: Internal Medicine

## 2013-09-22 DIAGNOSIS — Z7901 Long term (current) use of anticoagulants: Secondary | ICD-10-CM

## 2013-09-22 DIAGNOSIS — Z5181 Encounter for therapeutic drug level monitoring: Secondary | ICD-10-CM

## 2013-09-22 DIAGNOSIS — I82409 Acute embolism and thrombosis of unspecified deep veins of unspecified lower extremity: Secondary | ICD-10-CM

## 2013-09-22 DIAGNOSIS — I2699 Other pulmonary embolism without acute cor pulmonale: Secondary | ICD-10-CM

## 2013-09-22 LAB — POCT INR: INR: 2.4

## 2013-11-03 ENCOUNTER — Ambulatory Visit (INDEPENDENT_AMBULATORY_CARE_PROVIDER_SITE_OTHER): Payer: Medicare HMO | Admitting: *Deleted

## 2013-11-03 DIAGNOSIS — I82409 Acute embolism and thrombosis of unspecified deep veins of unspecified lower extremity: Secondary | ICD-10-CM

## 2013-11-03 DIAGNOSIS — Z7901 Long term (current) use of anticoagulants: Secondary | ICD-10-CM

## 2013-11-03 DIAGNOSIS — Z5181 Encounter for therapeutic drug level monitoring: Secondary | ICD-10-CM

## 2013-11-03 DIAGNOSIS — I2699 Other pulmonary embolism without acute cor pulmonale: Secondary | ICD-10-CM

## 2013-11-03 LAB — POCT INR: INR: 2.7

## 2013-11-17 ENCOUNTER — Other Ambulatory Visit: Payer: Self-pay

## 2013-11-17 ENCOUNTER — Encounter: Payer: Medicare HMO | Admitting: Internal Medicine

## 2013-11-17 DIAGNOSIS — Z0289 Encounter for other administrative examinations: Secondary | ICD-10-CM

## 2013-11-17 MED ORDER — LISINOPRIL 5 MG PO TABS: ORAL_TABLET | ORAL | Status: DC

## 2013-12-15 ENCOUNTER — Ambulatory Visit (INDEPENDENT_AMBULATORY_CARE_PROVIDER_SITE_OTHER): Payer: Medicare HMO | Admitting: *Deleted

## 2013-12-15 DIAGNOSIS — Z5181 Encounter for therapeutic drug level monitoring: Secondary | ICD-10-CM

## 2013-12-15 DIAGNOSIS — Z7901 Long term (current) use of anticoagulants: Secondary | ICD-10-CM

## 2013-12-15 DIAGNOSIS — I82409 Acute embolism and thrombosis of unspecified deep veins of unspecified lower extremity: Secondary | ICD-10-CM

## 2013-12-15 DIAGNOSIS — I2699 Other pulmonary embolism without acute cor pulmonale: Secondary | ICD-10-CM

## 2013-12-15 LAB — POCT INR: INR: 2.4

## 2013-12-23 ENCOUNTER — Other Ambulatory Visit (INDEPENDENT_AMBULATORY_CARE_PROVIDER_SITE_OTHER): Payer: Medicare HMO

## 2013-12-23 ENCOUNTER — Telehealth: Payer: Self-pay

## 2013-12-23 DIAGNOSIS — I10 Essential (primary) hypertension: Secondary | ICD-10-CM

## 2013-12-23 DIAGNOSIS — Z0000 Encounter for general adult medical examination without abnormal findings: Secondary | ICD-10-CM

## 2013-12-23 DIAGNOSIS — E780 Pure hypercholesterolemia: Secondary | ICD-10-CM

## 2013-12-23 DIAGNOSIS — Z125 Encounter for screening for malignant neoplasm of prostate: Secondary | ICD-10-CM

## 2013-12-23 DIAGNOSIS — R7309 Other abnormal glucose: Secondary | ICD-10-CM

## 2013-12-23 LAB — CBC WITH DIFFERENTIAL/PLATELET
Basophils Absolute: 0 10*3/uL (ref 0.0–0.1)
Basophils Relative: 0.5 % (ref 0.0–3.0)
Eosinophils Absolute: 0.2 10*3/uL (ref 0.0–0.7)
Eosinophils Relative: 2.4 % (ref 0.0–5.0)
HCT: 51 % (ref 39.0–52.0)
Hemoglobin: 16.3 g/dL (ref 13.0–17.0)
Lymphocytes Relative: 46.9 % — ABNORMAL HIGH (ref 12.0–46.0)
Lymphs Abs: 3.1 10*3/uL (ref 0.7–4.0)
MCHC: 32 g/dL (ref 30.0–36.0)
MCV: 88 fl (ref 78.0–100.0)
Monocytes Absolute: 0.4 10*3/uL (ref 0.1–1.0)
Monocytes Relative: 5.9 % (ref 3.0–12.0)
Neutro Abs: 2.9 10*3/uL (ref 1.4–7.7)
Neutrophils Relative %: 44.3 % (ref 43.0–77.0)
Platelets: 262 10*3/uL (ref 150.0–400.0)
RBC: 5.79 Mil/uL (ref 4.22–5.81)
RDW: 13.4 % (ref 11.5–15.5)
WBC: 6.6 10*3/uL (ref 4.0–10.5)

## 2013-12-23 LAB — LIPID PANEL
CHOL/HDL RATIO: 3
Cholesterol: 114 mg/dL (ref 0–200)
HDL: 39.4 mg/dL (ref >39.00)
LDL CALC: 57 mg/dL (ref 0–99)
NONHDL: 74.6
TRIGLYCERIDES: 89 mg/dL (ref 0.0–149.0)
VLDL: 17.8 mg/dL (ref 0.0–40.0)

## 2013-12-23 LAB — BASIC METABOLIC PANEL WITH GFR
BUN: 11 mg/dL (ref 6–23)
CO2: 26 meq/L (ref 19–32)
Calcium: 9.5 mg/dL (ref 8.4–10.5)
Chloride: 104 meq/L (ref 96–112)
Creatinine, Ser: 1.2 mg/dL (ref 0.4–1.5)
GFR: 76.83 mL/min
Glucose, Bld: 176 mg/dL — ABNORMAL HIGH (ref 70–99)
Potassium: 4.9 meq/L (ref 3.5–5.1)
Sodium: 137 meq/L (ref 135–145)

## 2013-12-23 LAB — URINALYSIS, ROUTINE W REFLEX MICROSCOPIC
BILIRUBIN URINE: NEGATIVE
Hgb urine dipstick: NEGATIVE
Ketones, ur: NEGATIVE
LEUKOCYTES UA: NEGATIVE
Nitrite: NEGATIVE
RBC / HPF: NONE SEEN (ref 0–?)
Total Protein, Urine: NEGATIVE
URINE GLUCOSE: NEGATIVE
UROBILINOGEN UA: 0.2 (ref 0.0–1.0)
WBC, UA: NONE SEEN (ref 0–?)
pH: 7 (ref 5.0–8.0)

## 2013-12-23 LAB — HEPATIC FUNCTION PANEL
ALK PHOS: 69 U/L (ref 39–117)
ALT: 21 U/L (ref 0–53)
AST: 19 U/L (ref 0–37)
Albumin: 3.3 g/dL — ABNORMAL LOW (ref 3.5–5.2)
Bilirubin, Direct: 0.1 mg/dL (ref 0.0–0.3)
TOTAL PROTEIN: 6.9 g/dL (ref 6.0–8.3)
Total Bilirubin: 0.8 mg/dL (ref 0.2–1.2)

## 2013-12-23 LAB — HEMOGLOBIN A1C: Hgb A1c MFr Bld: 5.5 % (ref 4.6–6.5)

## 2013-12-23 LAB — TSH: TSH: 0.96 u[IU]/mL (ref 0.35–4.50)

## 2013-12-23 LAB — PSA: PSA: 0.01 ng/mL — ABNORMAL LOW (ref 0.10–4.00)

## 2013-12-23 NOTE — Telephone Encounter (Signed)
cpx labs entered  

## 2013-12-25 ENCOUNTER — Encounter: Payer: Self-pay | Admitting: Internal Medicine

## 2013-12-25 ENCOUNTER — Ambulatory Visit (INDEPENDENT_AMBULATORY_CARE_PROVIDER_SITE_OTHER): Payer: Medicare HMO | Admitting: Internal Medicine

## 2013-12-25 VITALS — BP 134/78 | HR 74 | Ht 69.0 in | Wt 205.0 lb

## 2013-12-25 DIAGNOSIS — Z0000 Encounter for general adult medical examination without abnormal findings: Secondary | ICD-10-CM

## 2013-12-25 NOTE — Patient Instructions (Signed)
Your physician recommends that you continue on your current medications as directed. Please refer to the Current Medication list given to you today. Your physician wants you to follow-up in: 1 year with Dr. Ross.  You will receive a reminder letter in the mail two months in advance. If you don't receive a letter, please call our office to schedule the follow-up appointment.  

## 2013-12-25 NOTE — Progress Notes (Signed)
HPI Patient is a 73 year old with a history of chronic LE DVT and PE.  Echo in 2012 showed normal LV and RV function. Since I saw him in clinic he has done well.  No CP  No SOB  Does have some chronic calf discomfort.   No Known Allergies  Current Outpatient Prescriptions  Medication Sig Dispense Refill  . allopurinol (ZYLOPRIM) 100 MG tablet TAKE ONE TABLET BY MOUTH ONCE DAILY  90 tablet  3  . b complex vitamins tablet Take 1 tablet by mouth daily.      Marland Kitchen lisinopril (PRINIVIL,ZESTRIL) 5 MG tablet TAKE ONE TABLET BY MOUTH ONCE DAILY  90 tablet  0  . pravastatin (PRAVACHOL) 20 MG tablet TAKE ONE TABLET BY MOUTH ONCE DAILY  90 tablet  3  . warfarin (COUMADIN) 5 MG tablet 1 tablet everyday except 1.5 tablets on Tuesdays,Thursdays and Sundays or as directed by coumadin clinic  120 tablet  1   No current facility-administered medications for this visit.    Past Medical History  Diagnosis Date  . History of DVT (deep vein thrombosis)   . History of colon polyps   . BPH (benign prostatic hyperplasia)   . Hypercholesteremia   . HTN (hypertension)   . Diverticulosis of colon   . DJD (degenerative joint disease)     left foot, "pt denies"  . Cancer of prostate   . Plantar fasciitis   . Glucose intolerance (impaired glucose tolerance) 11/03/2010  . Anxiety   . Lumbar degenerative disc disease 11/12/2011  . Atherosclerosis of abdominal aorta 11/12/2011    Past Surgical History  Procedure Laterality Date  . Uvulectomy    . Insertion prostate radiation seed      Family History  Problem Relation Age of Onset  . Deep vein thrombosis Mother   . Diabetes Mother     History   Social History  . Marital Status: Married    Spouse Name: N/A    Number of Children: 2  . Years of Education: N/A   Occupational History  . retired    Social History Main Topics  . Smoking status: Former Research scientist (life sciences)  . Smokeless tobacco: Never Used  . Alcohol Use: No  . Drug Use: No  . Sexual Activity: Not  Currently   Other Topics Concern  . Not on file   Social History Narrative  . No narrative on file    Review of Systems:  All systems reviewed.  They are negative to the above problem except as previously stated.  Vital Signs: BP 134/78   P 74  Wt 205 Physical Exam Patinet in NAD  BP HEENT:  Normocephalic, atraumatic. EOMI, PERRLA.  Neck: JVP is normal.  No bruits.  Lungs: clear to auscultation. No rales no wheezes.  Heart: Regular rate and rhythm. Normal S1, S2. No S3.   No significant murmurs. PMI not displaced.  Abdomen:  Supple, nontender. Normal bowel sounds. No masses. No hepatomegaly.  Extremities:   Good distal pulses throughout. NO lower extremity edema RLE Musculoskeletal :moving all extremities.  Neuro:   alert and oriented x3.  CN II-XII grossly intact.   Assessment and Plan: 1.  PE/DVT  COntinue coumadin    2.  HTN  BP is OK  Keep on same regimen  Labs OK    3.  HL  Mild plaquing of aorta.  Keep on statin   Has some mild aching in legs  Constant  Asked him to stop pravachol for 1  wk and see if he notices a difference.  If no change resume  Otherwise call    F/U in 1 year.  Encouraged him to increase active.  He wants to lose wt.

## 2013-12-29 ENCOUNTER — Encounter: Payer: Self-pay | Admitting: Internal Medicine

## 2013-12-29 ENCOUNTER — Ambulatory Visit (INDEPENDENT_AMBULATORY_CARE_PROVIDER_SITE_OTHER): Payer: Medicare HMO | Admitting: Internal Medicine

## 2013-12-29 VITALS — BP 114/68 | HR 67 | Temp 98.6°F | Ht 69.0 in | Wt 206.2 lb

## 2013-12-29 DIAGNOSIS — R7302 Impaired glucose tolerance (oral): Secondary | ICD-10-CM

## 2013-12-29 DIAGNOSIS — Z0000 Encounter for general adult medical examination without abnormal findings: Secondary | ICD-10-CM

## 2013-12-29 NOTE — Patient Instructions (Signed)
Please continue all other medications as before, and refills have been done if requested.  Please have the pharmacy call with any other refills you may need.  Please continue your efforts at being more active, low cholesterol diet, and weight control.  You are otherwise up to date with prevention measures today.  Please keep your appointments with your specialists as you may have planned  Please return in 1 year for your yearly visit, or sooner if needed, with Lab testing done 3-5 days before  

## 2013-12-29 NOTE — Assessment & Plan Note (Signed)
stable overall by history and exam, recent data reviewed with pt, and pt to continue medical treatment as before,  to f/u any worsening symptoms or concerns Lab Results  Component Value Date   HGBA1C 5.5 12/23/2013

## 2013-12-29 NOTE — Progress Notes (Signed)
Pre visit review using our clinic review tool, if applicable. No additional management support is needed unless otherwise documented below in the visit note. 

## 2013-12-29 NOTE — Assessment & Plan Note (Signed)

## 2013-12-29 NOTE — Progress Notes (Signed)
Subjective:    Patient ID: Kevin Hale, male    DOB: Sep 09, 1940, 73 y.o.   MRN: [id number]  HPI  Here for wellness and f/u;  Overall doing ok;  Pt denies CP, worsening SOB, DOE, wheezing, orthopnea, PND, worsening LE edema, palpitations, dizziness or syncope.  Pt denies neurological change such as new headache, facial or extremity weakness.  Pt denies polydipsia, polyuria, or low sugar symptoms. Pt states overall good compliance with treatment and medications, good tolerability, and has been trying to follow lower cholesterol diet.  Pt denies worsening depressive symptoms, suicidal ideation or panic. No fever, night sweats, wt loss, loss of appetite, or other constitutional symptoms.  Pt states good ability with ADL's, has low fall risk, home safety reviewed and adequate, no other significant changes in hearing or vision, and only occasionally active with exercise.  Has urology appt in 2 days.  Has had some leg pain below the right knee, so is taking off for 1 wk to see if pain improved. Past Medical History  Diagnosis Date  . History of DVT (deep vein thrombosis)   . History of colon polyps   . BPH (benign prostatic hyperplasia)   . Hypercholesteremia   . HTN (hypertension)   . Diverticulosis of colon   . DJD (degenerative joint disease)     left foot, "pt denies"  . Cancer of prostate   . Plantar fasciitis   . Glucose intolerance (impaired glucose tolerance) 11/03/2010  . Anxiety   . Lumbar degenerative disc disease 11/12/2011  . Atherosclerosis of abdominal aorta 11/12/2011   Past Surgical History  Procedure Laterality Date  . Uvulectomy    . Insertion prostate radiation seed      reports that he has quit smoking. He has never used smokeless tobacco. He reports that he does not drink alcohol or use illicit drugs. family history includes Deep vein thrombosis in his mother; Diabetes in his mother. No Known Allergies Current Outpatient Prescriptions on File Prior to Visit  Medication  Sig Dispense Refill  . allopurinol (ZYLOPRIM) 100 MG tablet TAKE ONE TABLET BY MOUTH ONCE DAILY 90 tablet 3  . b complex vitamins tablet Take 1 tablet by mouth daily.    Marland Kitchen lisinopril (PRINIVIL,ZESTRIL) 5 MG tablet TAKE ONE TABLET BY MOUTH ONCE DAILY 90 tablet 0  . pravastatin (PRAVACHOL) 20 MG tablet TAKE ONE TABLET BY MOUTH ONCE DAILY 90 tablet 3  . warfarin (COUMADIN) 5 MG tablet 1 tablet everyday except 1.5 tablets on Tuesdays,Thursdays and Sundays or as directed by coumadin clinic 120 tablet 1   No current facility-administered medications on file prior to visit.   Review of Systems Constitutional: Negative for increased diaphoresis, other activity, appetite or other siginficant weight change  HENT: Negative for worsening hearing loss, ear pain, facial swelling, mouth sores and neck stiffness.   Eyes: Negative for other worsening pain, redness or visual disturbance.  Respiratory: Negative for shortness of breath and wheezing.   Cardiovascular: Negative for chest pain and palpitations.  Gastrointestinal: Negative for diarrhea, blood in stool, abdominal distention or other pain Genitourinary: Negative for hematuria, flank pain or change in urine volume.  Musculoskeletal: Negative for myalgias or other joint complaints.  Skin: Negative for color change and wound.  Neurological: Negative for syncope and numbness. other than noted Hematological: Negative for adenopathy. or other swelling Psychiatric/Behavioral: Negative for hallucinations, self-injury, decreased concentration or other worsening agitation.      Objective:   Physical Exam BP 114/68 mmHg  Pulse 67  Temp(Src) 98.6 F (37 C) (Oral)  Ht 5\' 9"  (1.753 m)  Wt 206 lb 4 oz (93.554 kg)  BMI 30.44 kg/m2  SpO2 98% VS noted,  Constitutional: Pt is oriented to person, place, and time. Appears well-developed and well-nourished.  Head: Normocephalic and atraumatic.  Right Ear: External ear normal.  Left Ear: External ear normal.    Nose: Nose normal.  Mouth/Throat: Oropharynx is clear and moist.  Eyes: Conjunctivae and EOM are normal. Pupils are equal, round, and reactive to light.  Neck: Normal range of motion. Neck supple. No JVD present. No tracheal deviation present.  Cardiovascular: Normal rate, regular rhythm, normal heart sounds and intact distal pulses.   Pulmonary/Chest: Effort normal and breath sounds without rales or wheezing  Abdominal: Soft. Bowel sounds are normal. NT. No HSM  Musculoskeletal: Normal range of motion. Exhibits no edema.  Lymphadenopathy:  Has no cervical adenopathy.  Neurological: Pt is alert and oriented to person, place, and time. Pt has normal reflexes. No cranial nerve deficit. Motor grossly intact Skin: Skin is warm and dry. No rash noted.  Psychiatric:  Has mild nervous mood and affect. Behavior is normal.     Assessment & Plan:

## 2014-01-26 ENCOUNTER — Ambulatory Visit (INDEPENDENT_AMBULATORY_CARE_PROVIDER_SITE_OTHER): Payer: Medicare HMO | Admitting: Pharmacist

## 2014-01-26 DIAGNOSIS — I82409 Acute embolism and thrombosis of unspecified deep veins of unspecified lower extremity: Secondary | ICD-10-CM

## 2014-01-26 DIAGNOSIS — I2699 Other pulmonary embolism without acute cor pulmonale: Secondary | ICD-10-CM

## 2014-01-26 DIAGNOSIS — Z5181 Encounter for therapeutic drug level monitoring: Secondary | ICD-10-CM

## 2014-01-26 DIAGNOSIS — Z7901 Long term (current) use of anticoagulants: Secondary | ICD-10-CM

## 2014-01-26 LAB — POCT INR: INR: 2.4

## 2014-03-08 ENCOUNTER — Other Ambulatory Visit: Payer: Self-pay | Admitting: Surgery

## 2014-03-08 MED ORDER — WARFARIN SODIUM 5 MG PO TABS: ORAL_TABLET | ORAL | Status: DC

## 2014-03-09 ENCOUNTER — Ambulatory Visit (INDEPENDENT_AMBULATORY_CARE_PROVIDER_SITE_OTHER): Payer: Medicare Other | Admitting: *Deleted

## 2014-03-09 DIAGNOSIS — Z5181 Encounter for therapeutic drug level monitoring: Secondary | ICD-10-CM | POA: Diagnosis not present

## 2014-03-09 DIAGNOSIS — I82409 Acute embolism and thrombosis of unspecified deep veins of unspecified lower extremity: Secondary | ICD-10-CM

## 2014-03-09 DIAGNOSIS — I2699 Other pulmonary embolism without acute cor pulmonale: Secondary | ICD-10-CM | POA: Diagnosis not present

## 2014-03-09 DIAGNOSIS — Z7901 Long term (current) use of anticoagulants: Secondary | ICD-10-CM

## 2014-03-09 LAB — POCT INR: INR: 2.2

## 2014-03-16 ENCOUNTER — Encounter: Payer: Self-pay | Admitting: Internal Medicine

## 2014-03-16 ENCOUNTER — Ambulatory Visit (INDEPENDENT_AMBULATORY_CARE_PROVIDER_SITE_OTHER)
Admission: RE | Admit: 2014-03-16 | Discharge: 2014-03-16 | Disposition: A | Discharge status: Home / Self Care | Payer: Medicare Other | Source: Ambulatory Visit | Attending: Internal Medicine | Admitting: Internal Medicine

## 2014-03-16 ENCOUNTER — Ambulatory Visit (INDEPENDENT_AMBULATORY_CARE_PROVIDER_SITE_OTHER): Payer: Medicare Other | Admitting: Internal Medicine

## 2014-03-16 VITALS — BP 122/80 | HR 70 | Temp 97.7°F | Ht 69.0 in | Wt 198.1 lb

## 2014-03-16 DIAGNOSIS — M79604 Pain in right leg: Secondary | ICD-10-CM | POA: Diagnosis not present

## 2014-03-16 DIAGNOSIS — I10 Essential (primary) hypertension: Secondary | ICD-10-CM

## 2014-03-16 DIAGNOSIS — M160 Bilateral primary osteoarthritis of hip: Secondary | ICD-10-CM | POA: Diagnosis not present

## 2014-03-16 DIAGNOSIS — I700 Atherosclerosis of aorta: Secondary | ICD-10-CM | POA: Diagnosis not present

## 2014-03-16 DIAGNOSIS — M79605 Pain in left leg: Secondary | ICD-10-CM

## 2014-03-16 DIAGNOSIS — C61 Malignant neoplasm of prostate: Secondary | ICD-10-CM | POA: Diagnosis not present

## 2014-03-16 DIAGNOSIS — M79651 Pain in right thigh: Secondary | ICD-10-CM | POA: Diagnosis not present

## 2014-03-16 DIAGNOSIS — M25561 Pain in right knee: Secondary | ICD-10-CM | POA: Diagnosis not present

## 2014-03-16 NOTE — Progress Notes (Signed)
Pre visit review using our clinic review tool, if applicable. No additional management support is needed unless otherwise documented below in the visit note. 

## 2014-03-16 NOTE — Assessment & Plan Note (Signed)
stable overall by history and exam, recent data reviewed with pt, and pt to continue medical treatment as before,  to f/u any worsening symptoms or concerns BP Readings from Last 3 Encounters:  03/16/14 122/80  12/29/13 114/68  12/25/13 134/78

## 2014-03-16 NOTE — Progress Notes (Signed)
Subjective:    Patient ID: Kevin Hale, male    DOB: [birth year], 74 y.o.   MRN: [id number]  HPI  Here with ongoing persistent intermittent mild to mod pain to right medial thigh and groin area, tender and sore to palpate on ant right thigh and leg below the knee, and also tender to sore to left ant thigh as well. Is on statin, has not tried holiday off.  Wife had similar but in her arms.  Pt continues to have recurring LBP without change in severity, bowel or bladder change, fever, wt loss,  worsening LE pain/numbness/weakness, gait change or falls.   Pt denies fever, wt loss, night sweats, loss of appetite, or other constitutional symptoms, no rash, swelling, redness.  No hx of myositis or neuromusc dz. Feels better overall in the legs with walkign up to 1 hr on treadmill. (never resolves however)  Past Medical History  Diagnosis Date  . History of DVT (deep vein thrombosis)   . History of colon polyps   . BPH (benign prostatic hyperplasia)   . Hypercholesteremia   . HTN (hypertension)   . Diverticulosis of colon   . DJD (degenerative joint disease)     left foot, "pt denies"  . Cancer of prostate   . Plantar fasciitis   . Glucose intolerance (impaired glucose tolerance) 11/03/2010  . Anxiety   . Lumbar degenerative disc disease 11/12/2011  . Atherosclerosis of abdominal aorta 11/12/2011   Past Surgical History  Procedure Laterality Date  . Uvulectomy    . Insertion prostate radiation seed      reports that he has quit smoking. He has never used smokeless tobacco. He reports that he does not drink alcohol or use illicit drugs. family history includes Deep vein thrombosis in his mother; Diabetes in his mother. No Known Allergies Current Outpatient Prescriptions on File Prior to Visit  Medication Sig Dispense Refill  . allopurinol (ZYLOPRIM) 100 MG tablet TAKE ONE TABLET BY MOUTH ONCE DAILY 90 tablet 3  . b complex vitamins tablet Take 1 tablet by mouth daily.    Marland Kitchen lisinopril  (PRINIVIL,ZESTRIL) 5 MG tablet TAKE ONE TABLET BY MOUTH ONCE DAILY 90 tablet 0  . pravastatin (PRAVACHOL) 20 MG tablet TAKE ONE TABLET BY MOUTH ONCE DAILY 90 tablet 3  . warfarin (COUMADIN) 5 MG tablet 1 tablet everyday except 1.5 tablets on Tuesdays,Thursdays and Sundays or as directed by coumadin clinic 120 tablet 1   No current facility-administered medications on file prior to visit.    Review of Systems  Constitutional: Negative for unusual diaphoresis or other sweats  HENT: Negative for ringing in ear Eyes: Negative for double vision or worsening visual disturbance.  Respiratory: Negative for choking and stridor.   Gastrointestinal: Negative for vomiting or other signifcant bowel change Genitourinary: Negative for hematuria or decreased urine volume.  Musculoskeletal: Negative for other MSK pain or swelling Skin: Negative for color change and worsening wound.  Neurological: Negative for tremors and numbness other than noted  Psychiatric/Behavioral: Negative for decreased concentration or agitation other than above       Objective:   Physical Exam BP 122/80 mmHg  Pulse 70  Temp(Src) 97.7 F (36.5 C) (Oral)  Ht 5\' 9"  (1.753 m)  Wt 198 lb 2 oz (89.869 kg)  BMI 29.24 kg/m2  SpO2 97% VS noted,  Constitutional: Pt appears well-developed, well-nourished.  HENT: Head: NCAT.  Right Ear: External ear normal.  Left Ear: External ear normal.  Eyes: . Pupils are equal,  round, and reactive to light. Conjunctivae and EOM are normal Neck: Normal range of motion. Neck supple.  Cardiovascular: Normal rate and regular rhythm.   Pulmonary/Chest: Effort normal and breath sounds without rales or wheezing.  Bilat knees with bony changes suggestive of DJD Bilat hip with mild reduced ROM, no pain elicited with ROM Neurological: Pt is alert. Not confused , motor grossly intact Skin: Skin is warm. No rash, dorsalis pedis trace to 1+ bilat, no LE edema Psychiatric: Pt behavior is normal. No  agitation.     Assessment & Plan:

## 2014-03-16 NOTE — Assessment & Plan Note (Signed)
Etiology unclear, for right hip/femur/knee films, also ABI's for LE's, also trial hold statin for 1 month, then re-start if no improvement

## 2014-03-16 NOTE — Assessment & Plan Note (Signed)
Also for ABI's - r/o PVD,  to f/u any worsening symptoms or concerns

## 2014-03-16 NOTE — Patient Instructions (Signed)
OK to hold off on taking the Pravastatin for 1 month  Please re-start the Pravastatin at that time if no improvement with the leg pain  If you are improved, we will need to consider a new statin at your next visit  You will be contacted regarding the referral for: Leg artery circulation testing (at a later date, not today)  Please go to the XRAY Department in the Basement (go straight as you get off the elevator) for the x-ray testing  If the above is not helpful in determing the cause, you may want to see Dr Smith/sports medicine in this office  You will be contacted by phone if any changes need to be made immediately.  Otherwise, you will receive a letter about your results with an explanation, but please check with MyChart first.  Please remember to sign up for MyChart if you have not done so, as this will be important to you in the future with finding out test results, communicating by private email, and scheduling acute appointments online when needed.

## 2014-04-05 ENCOUNTER — Other Ambulatory Visit: Payer: Self-pay | Admitting: Internal Medicine

## 2014-04-20 ENCOUNTER — Ambulatory Visit (INDEPENDENT_AMBULATORY_CARE_PROVIDER_SITE_OTHER): Payer: Medicare Other | Admitting: *Deleted

## 2014-04-20 DIAGNOSIS — I2699 Other pulmonary embolism without acute cor pulmonale: Secondary | ICD-10-CM

## 2014-04-20 DIAGNOSIS — Z5181 Encounter for therapeutic drug level monitoring: Secondary | ICD-10-CM

## 2014-04-20 DIAGNOSIS — I82409 Acute embolism and thrombosis of unspecified deep veins of unspecified lower extremity: Secondary | ICD-10-CM

## 2014-04-20 DIAGNOSIS — Z7901 Long term (current) use of anticoagulants: Secondary | ICD-10-CM | POA: Diagnosis not present

## 2014-04-20 LAB — POCT INR: INR: 2.3

## 2014-04-27 ENCOUNTER — Encounter: Payer: Self-pay | Admitting: Internal Medicine

## 2014-05-04 MED ORDER — ATORVASTATIN CALCIUM 10 MG PO TABS: 10.0000 mg | ORAL_TABLET | Freq: Every day | ORAL | Status: DC

## 2014-05-04 NOTE — Telephone Encounter (Signed)
I think this is first i have been aware  Rx changed to lipitor. thanks

## 2014-05-04 NOTE — Telephone Encounter (Signed)
Patient called back in regards to this.  States he has been waiting over a month for Dr. Jenny Reichmann to call something else into the pharmacy in place of pravastatin.

## 2014-05-05 NOTE — Telephone Encounter (Signed)
Already addressed, lipitor sent

## 2014-05-11 MED ORDER — SIMVASTATIN 20 MG PO TABS: 20.0000 mg | ORAL_TABLET | Freq: Every day | ORAL | Status: DC

## 2014-05-11 NOTE — Addendum Note (Signed)
Addended by: Biagio Borg on: 05/11/2014 09:46 AM   Modules accepted: Orders

## 2014-05-11 NOTE — Telephone Encounter (Signed)
[place]erson for change to simvastatin - done erx

## 2014-05-14 ENCOUNTER — Other Ambulatory Visit: Payer: Self-pay

## 2014-05-14 DIAGNOSIS — M79605 Pain in left leg: Principal | ICD-10-CM

## 2014-05-14 DIAGNOSIS — M79604 Pain in right leg: Secondary | ICD-10-CM

## 2014-05-19 ENCOUNTER — Ambulatory Visit (HOSPITAL_COMMUNITY): Payer: Medicare Other | Attending: Internal Medicine | Admitting: *Deleted

## 2014-05-19 DIAGNOSIS — M79604 Pain in right leg: Secondary | ICD-10-CM | POA: Diagnosis not present

## 2014-05-19 DIAGNOSIS — M79605 Pain in left leg: Secondary | ICD-10-CM

## 2014-05-19 NOTE — Progress Notes (Signed)
Lower Extremity Arterial Doppler - Multilevel

## 2014-06-01 ENCOUNTER — Ambulatory Visit (INDEPENDENT_AMBULATORY_CARE_PROVIDER_SITE_OTHER): Payer: Medicare Other | Admitting: *Deleted

## 2014-06-01 DIAGNOSIS — Z5181 Encounter for therapeutic drug level monitoring: Secondary | ICD-10-CM | POA: Diagnosis not present

## 2014-06-01 DIAGNOSIS — I2699 Other pulmonary embolism without acute cor pulmonale: Secondary | ICD-10-CM | POA: Diagnosis not present

## 2014-06-01 DIAGNOSIS — I82409 Acute embolism and thrombosis of unspecified deep veins of unspecified lower extremity: Secondary | ICD-10-CM

## 2014-06-01 DIAGNOSIS — Z7901 Long term (current) use of anticoagulants: Secondary | ICD-10-CM

## 2014-06-01 LAB — POCT INR: INR: 2.2

## 2014-06-04 DIAGNOSIS — H9191 Unspecified hearing loss, right ear: Secondary | ICD-10-CM | POA: Diagnosis not present

## 2014-06-04 DIAGNOSIS — H903 Sensorineural hearing loss, bilateral: Secondary | ICD-10-CM | POA: Diagnosis not present

## 2014-06-04 DIAGNOSIS — H905 Unspecified sensorineural hearing loss: Secondary | ICD-10-CM | POA: Diagnosis not present

## 2014-06-15 ENCOUNTER — Encounter: Payer: Self-pay | Admitting: Internal Medicine

## 2014-06-16 NOTE — Telephone Encounter (Signed)
cherina to see above

## 2014-07-20 ENCOUNTER — Ambulatory Visit (INDEPENDENT_AMBULATORY_CARE_PROVIDER_SITE_OTHER): Payer: Medicare Other | Admitting: Surgery

## 2014-07-20 DIAGNOSIS — I2699 Other pulmonary embolism without acute cor pulmonale: Secondary | ICD-10-CM | POA: Diagnosis not present

## 2014-07-20 DIAGNOSIS — Z7901 Long term (current) use of anticoagulants: Secondary | ICD-10-CM

## 2014-07-20 DIAGNOSIS — Z5181 Encounter for therapeutic drug level monitoring: Secondary | ICD-10-CM | POA: Diagnosis not present

## 2014-07-20 DIAGNOSIS — I82409 Acute embolism and thrombosis of unspecified deep veins of unspecified lower extremity: Secondary | ICD-10-CM

## 2014-07-20 LAB — POCT INR: INR: 2.5

## 2014-07-26 ENCOUNTER — Other Ambulatory Visit: Payer: Self-pay | Admitting: Internal Medicine

## 2014-08-31 ENCOUNTER — Ambulatory Visit (INDEPENDENT_AMBULATORY_CARE_PROVIDER_SITE_OTHER): Payer: Medicare Other | Admitting: *Deleted

## 2014-08-31 DIAGNOSIS — Z5181 Encounter for therapeutic drug level monitoring: Secondary | ICD-10-CM

## 2014-08-31 DIAGNOSIS — I82409 Acute embolism and thrombosis of unspecified deep veins of unspecified lower extremity: Secondary | ICD-10-CM | POA: Diagnosis not present

## 2014-08-31 DIAGNOSIS — I2699 Other pulmonary embolism without acute cor pulmonale: Secondary | ICD-10-CM

## 2014-08-31 DIAGNOSIS — Z7901 Long term (current) use of anticoagulants: Secondary | ICD-10-CM | POA: Diagnosis not present

## 2014-08-31 LAB — POCT INR: INR: 1.9

## 2014-10-12 ENCOUNTER — Ambulatory Visit (INDEPENDENT_AMBULATORY_CARE_PROVIDER_SITE_OTHER): Payer: Medicare Other

## 2014-10-12 DIAGNOSIS — Z7901 Long term (current) use of anticoagulants: Secondary | ICD-10-CM

## 2014-10-12 DIAGNOSIS — I82409 Acute embolism and thrombosis of unspecified deep veins of unspecified lower extremity: Secondary | ICD-10-CM | POA: Diagnosis not present

## 2014-10-12 DIAGNOSIS — I2699 Other pulmonary embolism without acute cor pulmonale: Secondary | ICD-10-CM | POA: Diagnosis not present

## 2014-10-12 DIAGNOSIS — Z5181 Encounter for therapeutic drug level monitoring: Secondary | ICD-10-CM | POA: Diagnosis not present

## 2014-10-12 LAB — POCT INR: INR: 2.9

## 2014-10-25 ENCOUNTER — Other Ambulatory Visit: Payer: Self-pay | Admitting: Internal Medicine

## 2014-11-17 ENCOUNTER — Ambulatory Visit (INDEPENDENT_AMBULATORY_CARE_PROVIDER_SITE_OTHER): Payer: Medicare Other | Admitting: Internal Medicine

## 2014-11-17 VITALS — BP 120/72 | HR 71 | Temp 98.5°F | Ht 69.0 in | Wt 198.0 lb

## 2014-11-17 DIAGNOSIS — I10 Essential (primary) hypertension: Secondary | ICD-10-CM | POA: Diagnosis not present

## 2014-11-17 DIAGNOSIS — M6530 Trigger finger, unspecified finger: Secondary | ICD-10-CM

## 2014-11-17 DIAGNOSIS — R7302 Impaired glucose tolerance (oral): Secondary | ICD-10-CM | POA: Diagnosis not present

## 2014-11-17 MED ORDER — CELECOXIB 100 MG PO CAPS: 100.0000 mg | ORAL_CAPSULE | Freq: Two times a day (BID) | ORAL | Status: DC

## 2014-11-17 NOTE — Progress Notes (Signed)
Subjective:    Patient ID: Kevin Hale, male    DOB: October 11, 1940, 74 y.o.   MRN: [id number]  HPI  Here with new onset 1 mo worsening right 4th finger pain/mild swelling with pain worst at the PIP with clicking and has to force the finger to extend when gets stuck at full flexion. Pt denies chest pain, increased sob or doe, wheezing, orthopnea, PND, increased LE swelling, palpitations, dizziness or syncope.  Pt denies new neurological symptoms such as new headache, or facial or extremity weakness or numbness    Pt denies polydipsia, polyuria,  Past Medical History  Diagnosis Date  . History of DVT (deep vein thrombosis)   . History of colon polyps   . BPH (benign prostatic hyperplasia)   . Hypercholesteremia   . HTN (hypertension)   . Diverticulosis of colon   . DJD (degenerative joint disease)     left foot, "pt denies"  . Cancer of prostate   . Plantar fasciitis   . Glucose intolerance (impaired glucose tolerance) 11/03/2010  . Anxiety   . Lumbar degenerative disc disease 11/12/2011  . Atherosclerosis of abdominal aorta 11/12/2011   Past Surgical History  Procedure Laterality Date  . Uvulectomy    . Insertion prostate radiation seed      reports that he has quit smoking. He has never used smokeless tobacco. He reports that he does not drink alcohol or use illicit drugs. family history includes Deep vein thrombosis in his mother; Diabetes in his mother. Allergies  Allergen Reactions  . Pravastatin Other (See Comments)    myalgia   Current Outpatient Prescriptions on File Prior to Visit  Medication Sig Dispense Refill  . allopurinol (ZYLOPRIM) 100 MG tablet TAKE ONE TABLET BY MOUTH ONCE DAILY 90 tablet 2  . b complex vitamins tablet Take 1 tablet by mouth daily.    . Cholecalciferol (VITAMIN D3) 1000 UNITS CAPS Take 1 Int'l Units by mouth daily.    . folic acid (FOLVITE) 169 MCG tablet Take 400 mcg by mouth daily.    Marland Kitchen lisinopril (PRINIVIL,ZESTRIL) 5 MG tablet TAKE ONE TABLET  BY MOUTH ONCE DAILY 90 tablet 0  . Probiotic Product (PROBIOTIC DAILY PO) Take 1 tablet by mouth daily.    . simvastatin (ZOCOR) 20 MG tablet Take 1 tablet (20 mg total) by mouth at bedtime. 90 tablet 3  . warfarin (COUMADIN) 5 MG tablet 1 tablet everyday except 1.5 tablets on Tuesdays,Thursdays and Sundays or as directed by coumadin clinic 120 tablet 1   No current facility-administered medications on file prior to visit.    Review of Systems  Constitutional: Negative for unusual diaphoresis or night sweats HENT: Negative for ringing in ear or discharge Eyes: Negative for double vision or worsening visual disturbance.  Respiratory: Negative for choking and stridor.   Gastrointestinal: Negative for vomiting or other signifcant bowel change Genitourinary: Negative for hematuria or change in urine volume.  Musculoskeletal: Negative for other MSK pain or swelling Skin: Negative for color change and worsening wound.  Neurological: Negative for tremors and numbness other than noted  Psychiatric/Behavioral: Negative for decreased concentration or agitation other than above       Objective:   Physical Exam BP 120/72 mmHg  Pulse 71  Temp(Src) 98.5 F (36.9 C) (Oral)  Ht 5\' 9"  (1.753 m)  Wt 198 lb (89.812 kg)  BMI 29.23 kg/m2  SpO2 97% VS noted, not ill appearing Constitutional: Pt appears in no significant distress HENT: Head: NCAT.  Right Ear:  External ear normal.  Left Ear: External ear normal.  Eyes: . Pupils are equal, round, and reactive to light. Conjunctivae and EOM are normal Neck: Normal range of motion. Neck supple.  Cardiovascular: Normal rate and regular rhythm.   Pulmonary/Chest: Effort normal and breath sounds without rales or wheezing.  Right 4th finger with mild PIP tender, no effusion but decresaed ROM, palmar tendon tender as well Neurological: Pt is alert. Not confused , motor grossly intact Skin: Skin is warm. No rash, no LE edema Psychiatric: Pt behavior is  normal. No agitation.     Assessment & Plan:

## 2014-11-17 NOTE — Progress Notes (Signed)
Pre visit review using our clinic review tool, if applicable. No additional management support is needed unless otherwise documented below in the visit note. 

## 2014-11-17 NOTE — Assessment & Plan Note (Signed)
stable overall by history and exam, recent data reviewed with pt, and pt to continue medical treatment as before,  to f/u any worsening symptoms or concerns Lab Results  Component Value Date   HGBA1C 5.5 12/23/2013

## 2014-11-17 NOTE — Assessment & Plan Note (Signed)
stable overall by history and exam, recent data reviewed with pt, and pt to continue medical treatment as before,  to f/u any worsening symptoms or concerns BP Readings from Last 3 Encounters:  11/17/14 120/72  03/16/14 122/80  12/29/13 114/68

## 2014-11-17 NOTE — Assessment & Plan Note (Signed)
D/w pt, idealy would see hand surgeon but he declines as wife had similar but less than ideal outcome per pt; for nsaid prn (ok for celebrex on coumadin), to f/u sport med or call for hand surgury as he prefers if not improved

## 2014-11-17 NOTE — Patient Instructions (Signed)
Please take all new medication as prescribed - the anti-inflammatory  Please call for referral to Hand Surgury if you change your mind, or you could see Dr Smith/sports medicine in this office by just making an appointment  Please continue all other medications as before, and refills have been done if requested.  Please have the pharmacy call with any other refills you may need.  Please keep your appointments with your specialists as you may have planned

## 2014-11-23 ENCOUNTER — Ambulatory Visit (INDEPENDENT_AMBULATORY_CARE_PROVIDER_SITE_OTHER): Payer: Medicare Other | Admitting: *Deleted

## 2014-11-23 DIAGNOSIS — Z7901 Long term (current) use of anticoagulants: Secondary | ICD-10-CM | POA: Diagnosis not present

## 2014-11-23 DIAGNOSIS — I2699 Other pulmonary embolism without acute cor pulmonale: Secondary | ICD-10-CM

## 2014-11-23 DIAGNOSIS — Z5181 Encounter for therapeutic drug level monitoring: Secondary | ICD-10-CM | POA: Diagnosis not present

## 2014-11-23 DIAGNOSIS — I82409 Acute embolism and thrombosis of unspecified deep veins of unspecified lower extremity: Secondary | ICD-10-CM

## 2014-11-23 LAB — POCT INR: INR: 2.6

## 2014-11-30 DIAGNOSIS — D3101 Benign neoplasm of right conjunctiva: Secondary | ICD-10-CM | POA: Diagnosis not present

## 2014-11-30 DIAGNOSIS — Z961 Presence of intraocular lens: Secondary | ICD-10-CM | POA: Diagnosis not present

## 2014-11-30 DIAGNOSIS — H52203 Unspecified astigmatism, bilateral: Secondary | ICD-10-CM | POA: Diagnosis not present

## 2014-11-30 DIAGNOSIS — H40003 Preglaucoma, unspecified, bilateral: Secondary | ICD-10-CM | POA: Diagnosis not present

## 2014-12-02 ENCOUNTER — Encounter: Payer: Self-pay | Admitting: Internal Medicine

## 2014-12-22 ENCOUNTER — Encounter: Payer: Self-pay | Admitting: Family Medicine

## 2014-12-22 ENCOUNTER — Other Ambulatory Visit (INDEPENDENT_AMBULATORY_CARE_PROVIDER_SITE_OTHER): Payer: Medicare Other

## 2014-12-22 ENCOUNTER — Ambulatory Visit (INDEPENDENT_AMBULATORY_CARE_PROVIDER_SITE_OTHER): Payer: Medicare Other | Admitting: Family Medicine

## 2014-12-22 VITALS — BP 128/70 | HR 65 | Ht 69.0 in | Wt 200.0 lb

## 2014-12-22 DIAGNOSIS — M79644 Pain in right finger(s): Secondary | ICD-10-CM | POA: Diagnosis not present

## 2014-12-22 DIAGNOSIS — M6530 Trigger finger, unspecified finger: Secondary | ICD-10-CM | POA: Diagnosis not present

## 2014-12-22 NOTE — Progress Notes (Signed)
[facility] [place], [place] [address] Phone: [phone] Subjective:    I'm seeing this patient by the request  of:  Cathlean Cower, MD   CC: right hand pain.   Kevin Hale is a 74 y.o. male coming in with complaint of right hand pain. States that his ring finger on his right hand sometimes gets stuck. Straight now. Patient denies any injury. Denies any numbness. An states that in the morning it can be in a flexed position. Does do some repetitive activity with gardening. 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 Patient would like to remain active but finds it difficult secondary to this pain in the finger. Rates the severity of 6 out of 10.  Past Medical History  Diagnosis Date  . History of DVT (deep vein thrombosis)   . History of colon polyps   . BPH (benign prostatic hyperplasia)   . Hypercholesteremia   . HTN (hypertension)   . Diverticulosis of colon   . DJD (degenerative joint disease)     left foot, "pt denies"  . Cancer of prostate ([facility])   . Plantar fasciitis   . Glucose intolerance (impaired glucose tolerance) 11/03/2010  . Anxiety   . Lumbar degenerative disc disease 11/12/2011  . Atherosclerosis of abdominal aorta ([facility]) 11/12/2011   Past Surgical History  Procedure Laterality Date  . Uvulectomy    . Insertion prostate radiation seed     Social History  Substance Use Topics  . Smoking status: Former Research scientist (life sciences)  . Smokeless tobacco: Never Used  . Alcohol Use: No   Allergies  Allergen Reactions  . Pravastatin Other (See Comments)    myalgia   Family History  Problem Relation Age of Onset  . Deep vein thrombosis Mother   . Diabetes Mother         Past medical history, social, surgical and family history all reviewed in electronic medical record.   Review of Systems: No headache, visual changes, nausea, vomiting, diarrhea, constipation, dizziness, abdominal pain, skin rash, fevers, chills, night sweats,  weight loss, swollen lymph nodes, body aches, joint swelling, muscle aches, chest pain, shortness of breath, mood changes.   Objective Blood pressure 128/70, pulse 65, height 5\' 9"  (1.753 m), weight 200 lb (90.719 kg), SpO2 98 %.  General: No apparent distress alert and oriented x3 mood and affect normal, dressed appropriately.  HEENT: Pupils equal, extraocular movements intact  Respiratory: Patient's speak in full sentences and does not appear short of breath  Cardiovascular: No lower extremity edema, non tender, no erythema  Skin: Warm dry intact with no signs of infection or rash on extremities or on axial skeleton.  Abdomen: Soft nontender  Neuro: Cranial nerves II through XII are intact, neurovascularly intact in all extremities with 2+ DTRs and 2+ pulses.  Lymph: No lymphadenopathy of posterior or anterior cervical chain or axillae bilaterally.  Gait normal with good balance and coordination.  MSK:  Non tender with full range of motion and good stability and symmetric strength and tone of shoulders, elbows, wrist, hip, knee and ankles bilaterally. Arthritic changes of multiple joints. Hand exam shows the patient does have a trigger finger of the right ring finger. Patient does have tenderness over the nodule just proximal to the A2 pulley. Neurovascularly intact distally. Full strength of the hand. Mild arthritic changes of multiple joints of the hand including the DIP joints.  Limited musculoskeletal ultrasound was performed and interpreted by Hulan Saas, M  Limited ultrasound shows the patient does have a trigger nodule noted. Hypoechoic changes within the tendon sheath noted. Impression: Trigger finger  Procedure: Real-time Ultrasound Guided Injection of fourth right finger trigger nodule Device: GE Logiq E  Ultrasound guided injection is preferred based studies that show increased duration, increased effect, greater accuracy, decreased procedural pain, increased response rate, and  decreased cost with ultrasound guided versus blind injection.  Verbal informed consent obtained.  Time-out conducted.  Noted no overlying erythema, induration, or other signs of local infection.  Skin prepped in a sterile fashion.  Local anesthesia: Topical Ethyl chloride.  With sterile technique and under real time ultrasound guidance:  The 25-gauge 5/8 inch needle patient was injected with a total of 0.5 mL of 0.5% Marcaine and 0.5 mL of Kenalog 40 mg/dL Completed without difficulty  Pain immediately resolved suggesting accurate placement of the medication.  Advised to call if fevers/chills, erythema, induration, drainage, or persistent bleeding.  Images permanently stored and available for review in the ultrasound unit.  Impression: Technically successful ultrasound guided injection.    Impression and Recommendations:     This case required medical decision making of moderate complexity.

## 2014-12-22 NOTE — Patient Instructions (Signed)
Trigger finger Wear brace nightly for next 2 weeks Ice when needed May need another injection and can safely do it every 3 months.  Make an appointment in  2 weeks just in case.

## 2014-12-22 NOTE — Assessment & Plan Note (Signed)
Patient was given an injection today and tolerated the procedure well. We discussed icing regimen and patient will do bracing. Patient will do this at night at least. Discuss that we can repeat injections if necessary. Discuss long-term benefits. Patient will likely come back in 2 weeks for further evaluation.

## 2014-12-28 ENCOUNTER — Other Ambulatory Visit (INDEPENDENT_AMBULATORY_CARE_PROVIDER_SITE_OTHER): Payer: Medicare Other

## 2014-12-28 DIAGNOSIS — E7800 Pure hypercholesterolemia, unspecified: Secondary | ICD-10-CM

## 2014-12-28 DIAGNOSIS — R7302 Impaired glucose tolerance (oral): Secondary | ICD-10-CM | POA: Diagnosis not present

## 2014-12-28 DIAGNOSIS — Z8546 Personal history of malignant neoplasm of prostate: Secondary | ICD-10-CM

## 2014-12-28 DIAGNOSIS — Z0000 Encounter for general adult medical examination without abnormal findings: Secondary | ICD-10-CM | POA: Diagnosis not present

## 2014-12-28 DIAGNOSIS — I10 Essential (primary) hypertension: Secondary | ICD-10-CM | POA: Diagnosis not present

## 2014-12-28 LAB — HEPATIC FUNCTION PANEL
ALBUMIN: 3.8 g/dL (ref 3.5–5.2)
ALK PHOS: 75 U/L (ref 39–117)
ALT: 19 U/L (ref 0–53)
AST: 17 U/L (ref 0–37)
BILIRUBIN TOTAL: 0.7 mg/dL (ref 0.2–1.2)
Bilirubin, Direct: 0.2 mg/dL (ref 0.0–0.3)
Total Protein: 6.9 g/dL (ref 6.0–8.3)

## 2014-12-28 LAB — BASIC METABOLIC PANEL
BUN: 12 mg/dL (ref 6–23)
CO2: 28 mEq/L (ref 19–32)
Calcium: 9.5 mg/dL (ref 8.4–10.5)
Chloride: 104 mEq/L (ref 96–112)
Creatinine, Ser: 1.13 mg/dL (ref 0.40–1.50)
GFR: 81.34 mL/min (ref >60.00)
GLUCOSE: 185 mg/dL — AB (ref 70–99)
POTASSIUM: 4.1 meq/L (ref 3.5–5.1)
SODIUM: 138 meq/L (ref 135–145)

## 2014-12-28 LAB — URINALYSIS, ROUTINE W REFLEX MICROSCOPIC
Bilirubin Urine: NEGATIVE
Hgb urine dipstick: NEGATIVE
Ketones, ur: NEGATIVE
Leukocytes, UA: NEGATIVE
Nitrite: NEGATIVE
RBC / HPF: NONE SEEN (ref 0–?)
Total Protein, Urine: NEGATIVE
URINE GLUCOSE: NEGATIVE
Urobilinogen, UA: 0.2 (ref 0.0–1.0)
WBC, UA: NONE SEEN (ref 0–?)
pH: 6.5 (ref 5.0–8.0)

## 2014-12-28 LAB — CBC WITH DIFFERENTIAL/PLATELET
BASOS PCT: 0.5 % (ref 0.0–3.0)
Basophils Absolute: 0 10*3/uL (ref 0.0–0.1)
EOS PCT: 2.4 % (ref 0.0–5.0)
Eosinophils Absolute: 0.2 10*3/uL (ref 0.0–0.7)
HCT: 51.1 % (ref 39.0–52.0)
Hemoglobin: 16.4 g/dL (ref 13.0–17.0)
LYMPHS ABS: 3.2 10*3/uL (ref 0.7–4.0)
Lymphocytes Relative: 46.5 % — ABNORMAL HIGH (ref 12.0–46.0)
MCHC: 32.1 g/dL (ref 30.0–36.0)
MCV: 89 fl (ref 78.0–100.0)
MONO ABS: 0.5 10*3/uL (ref 0.1–1.0)
Monocytes Relative: 6.5 % (ref 3.0–12.0)
NEUTROS ABS: 3.1 10*3/uL (ref 1.4–7.7)
NEUTROS PCT: 44.1 % (ref 43.0–77.0)
PLATELETS: 274 10*3/uL (ref 150.0–400.0)
RBC: 5.75 Mil/uL (ref 4.22–5.81)
RDW: 13.1 % (ref 11.5–15.5)
WBC: 7 10*3/uL (ref 4.0–10.5)

## 2014-12-28 LAB — LIPID PANEL
CHOLESTEROL: 100 mg/dL (ref 0–200)
HDL: 41.3 mg/dL (ref >39.00)
LDL CALC: 39 mg/dL (ref 0–99)
NonHDL: 58.27
Total CHOL/HDL Ratio: 2
Triglycerides: 94 mg/dL (ref 0.0–149.0)
VLDL: 18.8 mg/dL (ref 0.0–40.0)

## 2014-12-28 LAB — TSH: TSH: 1.25 u[IU]/mL (ref 0.35–4.50)

## 2014-12-28 LAB — PSA: PSA: 0 ng/mL — ABNORMAL LOW (ref 0.10–4.00)

## 2014-12-28 LAB — HEMOGLOBIN A1C: Hgb A1c MFr Bld: 5.4 % (ref 4.6–6.5)

## 2014-12-29 ENCOUNTER — Other Ambulatory Visit: Payer: Self-pay | Admitting: Internal Medicine

## 2014-12-31 ENCOUNTER — Ambulatory Visit (INDEPENDENT_AMBULATORY_CARE_PROVIDER_SITE_OTHER): Payer: Medicare Other | Admitting: Internal Medicine

## 2014-12-31 ENCOUNTER — Encounter: Payer: Self-pay | Admitting: Internal Medicine

## 2014-12-31 VITALS — BP 120/72 | HR 84 | Ht 69.0 in | Wt 199.0 lb

## 2014-12-31 DIAGNOSIS — E7800 Pure hypercholesterolemia, unspecified: Secondary | ICD-10-CM

## 2014-12-31 DIAGNOSIS — Z23 Encounter for immunization: Secondary | ICD-10-CM | POA: Diagnosis not present

## 2014-12-31 DIAGNOSIS — R7302 Impaired glucose tolerance (oral): Secondary | ICD-10-CM

## 2014-12-31 DIAGNOSIS — I10 Essential (primary) hypertension: Secondary | ICD-10-CM

## 2014-12-31 DIAGNOSIS — Z0000 Encounter for general adult medical examination without abnormal findings: Secondary | ICD-10-CM

## 2014-12-31 NOTE — Patient Instructions (Addendum)
Please continue all other medications as before, and refills have been done if requested.  Please have the pharmacy call with any other refills you may need.  Please continue your efforts at being more active, low cholesterol diet, and weight control.  You are otherwise up to date with prevention measures today.  Please keep your appointments with your specialists as you may have planned  Your LAB work was OK today  Please return in 6 months, or sooner if needed

## 2014-12-31 NOTE — Progress Notes (Signed)
Subjective:    Patient ID: Kevin Hale, male    DOB: 1940/03/03, 74 y.o.   MRN: [id number]  HPI Here for wellness and f/u;  Overall doing ok;  Pt denies Chest pain, worsening SOB, DOE, wheezing, orthopnea, PND, worsening LE edema, palpitations, dizziness or syncope.  Pt denies neurological change such as new headache, facial or extremity weakness.  Pt denies polydipsia, polyuria, or low sugar symptoms. Pt states overall good compliance with treatment and medications, good tolerability, and has been trying to follow appropriate diet.  Pt denies worsening depressive symptoms, suicidal ideation or panic. No fever, night sweats, wt loss, loss of appetite, or other constitutional symptoms.  Pt states good ability with ADL's, has low fall risk, home safety reviewed and adequate, no other significant changes in hearing or vision, and only occasionally active with exercise. No current complaints Wt Readings from Last 3 Encounters:  12/31/14 199 lb (90.266 kg)  12/22/14 200 lb (90.719 kg)  11/17/14 198 lb (89.812 kg)   Past Medical History  Diagnosis Date  . History of DVT (deep vein thrombosis)   . History of colon polyps   . BPH (benign prostatic hyperplasia)   . Hypercholesteremia   . HTN (hypertension)   . Diverticulosis of colon   . DJD (degenerative joint disease)     left foot, "pt denies"  . Cancer of prostate ([place])   . Plantar fasciitis   . Glucose intolerance (impaired glucose tolerance) 11/03/2010  . Anxiety   . Lumbar degenerative disc disease 11/12/2011  . Atherosclerosis of abdominal aorta ([place]) 11/12/2011   Past Surgical History  Procedure Laterality Date  . Uvulectomy    . Insertion prostate radiation seed      reports that he has quit smoking. He has never used smokeless tobacco. He reports that he does not drink alcohol or use illicit drugs. family history includes Deep vein thrombosis in his mother; Diabetes in his mother. Allergies  Allergen Reactions  . Pravastatin  Other (See Comments)    myalgia   Current Outpatient Prescriptions on File Prior to Visit  Medication Sig Dispense Refill  . allopurinol (ZYLOPRIM) 100 MG tablet TAKE ONE TABLET BY MOUTH ONCE DAILY 90 tablet 0  . b complex vitamins tablet Take 1 tablet by mouth daily.    . celecoxib (CELEBREX) 100 MG capsule Take 1 capsule (100 mg total) by mouth 2 (two) times daily. As needed for pain 60 capsule 5  . Cholecalciferol (VITAMIN D3) 1000 UNITS CAPS Take 1 Int'l Units by mouth daily.    . folic acid (FOLVITE) 387 MCG tablet Take 400 mcg by mouth daily.    Marland Kitchen lisinopril (PRINIVIL,ZESTRIL) 5 MG tablet TAKE ONE TABLET BY MOUTH ONCE DAILY 90 tablet 0  . Probiotic Product (PROBIOTIC DAILY PO) Take 1 tablet by mouth daily.    . simvastatin (ZOCOR) 20 MG tablet Take 1 tablet (20 mg total) by mouth at bedtime. 90 tablet 3  . warfarin (COUMADIN) 5 MG tablet 1 tablet everyday except 1.5 tablets on Tuesdays,Thursdays and Sundays or as directed by coumadin clinic 120 tablet 1   No current facility-administered medications on file prior to visit.   Review of Systems Constitutional: Negative for increased diaphoresis, other activity, appetite or siginficant weight change other than noted HENT: Negative for worsening hearing loss, ear pain, facial swelling, mouth sores and neck stiffness.   Eyes: Negative for other worsening pain, redness or visual disturbance.  Respiratory: Negative for shortness of breath and wheezing  Cardiovascular:  Negative for chest pain and palpitations.  Gastrointestinal: Negative for diarrhea, blood in stool, abdominal distention or other pain Genitourinary: Negative for hematuria, flank pain or change in urine volume.  Musculoskeletal: Negative for myalgias or other joint complaints.  Skin: Negative for color change and wound or drainage.  Neurological: Negative for syncope and numbness. other than noted Hematological: Negative for adenopathy. or other  swelling Psychiatric/Behavioral: Negative for hallucinations, SI, self-injury, decreased concentration or other worsening agitation.      Objective:   Physical Exam BP 120/72 mmHg  Pulse 84  Ht 5\' 9"  (1.753 m)  Wt 199 lb (90.266 kg)  BMI 29.37 kg/m2  SpO2 97% VS noted,  Constitutional: Pt is oriented to person, place, and time. Appears well-developed and well-nourished, in no significant distress Head: Normocephalic and atraumatic.  Right Ear: External ear normal.  Left Ear: External ear normal.  Nose: Nose normal.  Mouth/Throat: Oropharynx is clear and moist.  Eyes: Conjunctivae and EOM are normal. Pupils are equal, round, and reactive to light.  Neck: Normal range of motion. Neck supple. No JVD present. No tracheal deviation present or significant neck LA or mass Cardiovascular: Normal rate, regular rhythm, normal heart sounds and intact distal pulses.   Pulmonary/Chest: Effort normal and breath sounds without rales or wheezing  Abdominal: Soft. Bowel sounds are normal. NT. No HSM  Musculoskeletal: Normal range of motion. Exhibits no edema.  Lymphadenopathy:  Has no cervical adenopathy.  Neurological: Pt is alert and oriented to person, place, and time. Pt has normal reflexes. No cranial nerve deficit. Motor grossly intact Skin: Skin is warm and dry. No rash noted.  Psychiatric:  Has normal mood and affect. Behavior is normal.     Assessment & Plan:

## 2014-12-31 NOTE — Progress Notes (Signed)
Pre visit review using our clinic review tool, if applicable. No additional management support is needed unless otherwise documented below in the visit note. 

## 2014-12-31 NOTE — Assessment & Plan Note (Signed)

## 2015-01-03 ENCOUNTER — Encounter: Payer: Self-pay | Admitting: Internal Medicine

## 2015-01-03 ENCOUNTER — Ambulatory Visit (INDEPENDENT_AMBULATORY_CARE_PROVIDER_SITE_OTHER): Payer: Medicare Other | Admitting: *Deleted

## 2015-01-03 ENCOUNTER — Ambulatory Visit (INDEPENDENT_AMBULATORY_CARE_PROVIDER_SITE_OTHER): Payer: Medicare Other | Admitting: Internal Medicine

## 2015-01-03 VITALS — BP 140/70 | HR 81 | Ht 69.0 in | Wt 196.1 lb

## 2015-01-03 DIAGNOSIS — I2699 Other pulmonary embolism without acute cor pulmonale: Secondary | ICD-10-CM

## 2015-01-03 DIAGNOSIS — I82409 Acute embolism and thrombosis of unspecified deep veins of unspecified lower extremity: Secondary | ICD-10-CM | POA: Diagnosis not present

## 2015-01-03 DIAGNOSIS — Z7901 Long term (current) use of anticoagulants: Secondary | ICD-10-CM

## 2015-01-03 DIAGNOSIS — Z5181 Encounter for therapeutic drug level monitoring: Secondary | ICD-10-CM

## 2015-01-03 DIAGNOSIS — I10 Essential (primary) hypertension: Secondary | ICD-10-CM

## 2015-01-03 LAB — POCT INR: INR: 2.3

## 2015-01-03 NOTE — Progress Notes (Signed)
Cardiology Office Note   Date:  01/03/2015   ID:  Kevin Hale, DOB 04-11-1940, MRN [id number]  PCP:  Cathlean Cower, MD  Cardiologist:   Dorris Carnes, MD   No chief complaint on file. F/U of chronic DVT    History of Present Illness: Kevin Hale is a 74 y.o. male with a history of chronic LE DVT and PE Echo in 2012 showed normal LV and RV function. I saw him last in Fall 2015 SInce seen he has done well  Breathing is OK  NO CP No dizziness Still has some LE edema esp in R ankle  Foot OK Walks regularly         Current Outpatient Prescriptions  Medication Sig Dispense Refill  . allopurinol (ZYLOPRIM) 100 MG tablet TAKE ONE TABLET BY MOUTH ONCE DAILY 90 tablet 0  . b complex vitamins tablet Take 1 tablet by mouth daily.    . Cholecalciferol (VITAMIN D3) 1000 UNITS CAPS Take 1 Int'l Units by mouth daily.    . folic acid (FOLVITE) 505 MCG tablet Take 400 mcg by mouth daily.    Marland Kitchen lisinopril (PRINIVIL,ZESTRIL) 5 MG tablet TAKE ONE TABLET BY MOUTH ONCE DAILY 90 tablet 0  . Probiotic Product (PROBIOTIC DAILY PO) Take 1 tablet by mouth daily.    . simvastatin (ZOCOR) 20 MG tablet Take 1 tablet (20 mg total) by mouth at bedtime. 90 tablet 3  . warfarin (COUMADIN) 5 MG tablet 1 tablet everyday except 1.5 tablets on Tuesdays,Thursdays and Sundays or as directed by coumadin clinic 120 tablet 1   No current facility-administered medications for this visit.    Allergies:   Pravastatin   Past Medical History  Diagnosis Date  . History of DVT (deep vein thrombosis)   . History of colon polyps   . BPH (benign prostatic hyperplasia)   . Hypercholesteremia   . HTN (hypertension)   . Diverticulosis of colon   . DJD (degenerative joint disease)     left foot, "pt denies"  . Cancer of prostate ([place])   . Plantar fasciitis   . Glucose intolerance (impaired glucose tolerance) 11/03/2010  . Anxiety   . Lumbar degenerative disc disease 11/12/2011  . Atherosclerosis of abdominal aorta ([place])  11/12/2011    Past Surgical History  Procedure Laterality Date  . Uvulectomy    . Insertion prostate radiation seed       Social History:  The patient  reports that he has quit smoking. He has never used smokeless tobacco. He reports that he does not drink alcohol or use illicit drugs.   Family History:  The patient's family history includes Deep vein thrombosis in his mother; Diabetes in his mother; Healthy in his brother and brother.    ROS:  Please see the history of present illness. All other systems are reviewed and  Negative to the above problem except as noted.    PHYSICAL EXAM: VS:  BP 140/70 mmHg  Pulse 81  Ht 5\' 9"  (1.753 m)  Wt 196 lb 1.9 oz (88.959 kg)  BMI 28.95 kg/m2  GEN: Well nourished, well developed, in no acute distress HEENT: normal Neck: no JVD, carotid bruits, or masses Cardiac: RRR; no murmurs, rubs, or gallops,  1+edema R ankle only  Tr in R leg Respiratory:  clear to auscultation bilaterally, normal work of breathing GI: soft, nontender, nondistended, + BS  No hepatomegaly  MS: no deformity Moving all extremities   Skin: warm and dry, no rash Neuro:  Strength and sensation are intact Psych: euthymic mood, full affect   EKG:  EKG is not ordered today.   Lipid Panel    Component Value Date/Time   CHOL 100 12/28/2014 0942   TRIG 94.0 12/28/2014 0942   HDL 41.30 12/28/2014 0942   CHOLHDL 2 12/28/2014 0942   VLDL 18.8 12/28/2014 0942   LDLCALC 39 12/28/2014 0942      Wt Readings from Last 3 Encounters:  01/03/15 196 lb 1.9 oz (88.959 kg)  12/31/14 199 lb (90.266 kg)  12/22/14 200 lb (90.719 kg)      ASSESSMENT AND PLAN:  1.  Hx DVT/ PE  Keep on coumadin  CBC is OK  2  HL  Keep on simvistatin  Follow lipids     Signed, Dorris Carnes, MD  01/03/2015 9:27 AM    [address], [place], [place]  [address] Phone: [phone]; Fax: [phone]

## 2015-01-03 NOTE — Patient Instructions (Signed)
Your physician recommends that you continue on your current medications as directed. Please refer to the Current Medication list given to you today. Your physician wants you to follow-up in: 1 year with Dr. Ross.  You will receive a reminder letter in the mail two months in advance. If you don't receive a letter, please call our office to schedule the follow-up appointment.  

## 2015-01-05 ENCOUNTER — Ambulatory Visit: Payer: Medicare Other | Admitting: Family Medicine

## 2015-01-18 ENCOUNTER — Other Ambulatory Visit: Payer: Self-pay | Admitting: Internal Medicine

## 2015-02-02 DIAGNOSIS — N5201 Erectile dysfunction due to arterial insufficiency: Secondary | ICD-10-CM | POA: Diagnosis not present

## 2015-02-02 DIAGNOSIS — Z8546 Personal history of malignant neoplasm of prostate: Secondary | ICD-10-CM | POA: Diagnosis not present

## 2015-02-14 ENCOUNTER — Ambulatory Visit (INDEPENDENT_AMBULATORY_CARE_PROVIDER_SITE_OTHER): Payer: Medicare Other

## 2015-02-14 DIAGNOSIS — I82409 Acute embolism and thrombosis of unspecified deep veins of unspecified lower extremity: Secondary | ICD-10-CM | POA: Diagnosis not present

## 2015-02-14 DIAGNOSIS — Z7901 Long term (current) use of anticoagulants: Secondary | ICD-10-CM | POA: Diagnosis not present

## 2015-02-14 DIAGNOSIS — Z5181 Encounter for therapeutic drug level monitoring: Secondary | ICD-10-CM | POA: Diagnosis not present

## 2015-02-14 DIAGNOSIS — I2699 Other pulmonary embolism without acute cor pulmonale: Secondary | ICD-10-CM | POA: Diagnosis not present

## 2015-02-14 LAB — POCT INR: INR: 2.4

## 2015-03-28 ENCOUNTER — Ambulatory Visit (INDEPENDENT_AMBULATORY_CARE_PROVIDER_SITE_OTHER): Payer: Medicare Other | Admitting: *Deleted

## 2015-03-28 DIAGNOSIS — Z5181 Encounter for therapeutic drug level monitoring: Secondary | ICD-10-CM | POA: Diagnosis not present

## 2015-03-28 DIAGNOSIS — I82409 Acute embolism and thrombosis of unspecified deep veins of unspecified lower extremity: Secondary | ICD-10-CM

## 2015-03-28 DIAGNOSIS — I2699 Other pulmonary embolism without acute cor pulmonale: Secondary | ICD-10-CM | POA: Diagnosis not present

## 2015-03-28 DIAGNOSIS — Z7901 Long term (current) use of anticoagulants: Secondary | ICD-10-CM

## 2015-03-28 LAB — POCT INR: INR: 3.1

## 2015-03-31 ENCOUNTER — Other Ambulatory Visit: Payer: Self-pay | Admitting: Internal Medicine

## 2015-04-25 ENCOUNTER — Ambulatory Visit (INDEPENDENT_AMBULATORY_CARE_PROVIDER_SITE_OTHER): Payer: Medicare Other | Admitting: *Deleted

## 2015-04-25 DIAGNOSIS — Z5181 Encounter for therapeutic drug level monitoring: Secondary | ICD-10-CM | POA: Diagnosis not present

## 2015-04-25 DIAGNOSIS — I2699 Other pulmonary embolism without acute cor pulmonale: Secondary | ICD-10-CM | POA: Diagnosis not present

## 2015-04-25 DIAGNOSIS — Z7901 Long term (current) use of anticoagulants: Secondary | ICD-10-CM | POA: Diagnosis not present

## 2015-04-25 DIAGNOSIS — I82409 Acute embolism and thrombosis of unspecified deep veins of unspecified lower extremity: Secondary | ICD-10-CM | POA: Diagnosis not present

## 2015-04-25 LAB — POCT INR: INR: 2.4

## 2015-05-18 ENCOUNTER — Encounter: Payer: Self-pay | Admitting: Podiatry

## 2015-05-18 ENCOUNTER — Ambulatory Visit (INDEPENDENT_AMBULATORY_CARE_PROVIDER_SITE_OTHER): Payer: Medicare Other | Admitting: Podiatry

## 2015-05-18 VITALS — BP 145/78 | HR 65 | Resp 16

## 2015-05-18 DIAGNOSIS — L309 Dermatitis, unspecified: Secondary | ICD-10-CM | POA: Diagnosis not present

## 2015-05-18 DIAGNOSIS — L84 Corns and callosities: Secondary | ICD-10-CM

## 2015-05-18 NOTE — Progress Notes (Signed)
   Subjective:    Patient ID: Kevin Hale, male    DOB: 10/14/1940, 75 y.o.   MRN: [id number]  HPI Pt presents with thick callus on the medial side of his left heel that is difficult to trim   Review of Systems  All other systems reviewed and are negative.      Objective:   Physical Exam        Assessment & Plan:

## 2015-05-18 NOTE — Progress Notes (Signed)
Subjective:     Patient ID: Kevin Hale, male   DOB: [birth year], 75 y.o.   MRN: [id number]  HPI patient states I have a callus on the bottom of my left heel which can be bothersome and I've tried to use a devices to reduce it but it does not seem to make a big difference   Review of Systems  All other systems reviewed and are negative.      Objective:   Physical Exam  Constitutional: He is oriented to person, place, and time.  Cardiovascular: Intact distal pulses.   Musculoskeletal: Normal range of motion.  Neurological: He is oriented to person, place, and time.  Skin: Skin is warm.  Nursing note and vitals reviewed.  neurovascular status found to be intact with dry skin noted bilateral and muscle strength mildly weekend with patient having reduced range of motion in the subtalar midtarsal joint. Patient's found have keratotic lesion medial side left heel that's painful when pressed and makes walking difficult and does have equinus noted. 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 Reappoint for routine care or earlier if needed

## 2015-05-23 ENCOUNTER — Other Ambulatory Visit (INDEPENDENT_AMBULATORY_CARE_PROVIDER_SITE_OTHER): Payer: Medicare Other

## 2015-05-23 ENCOUNTER — Encounter: Payer: Self-pay | Admitting: Internal Medicine

## 2015-05-23 ENCOUNTER — Ambulatory Visit (INDEPENDENT_AMBULATORY_CARE_PROVIDER_SITE_OTHER): Payer: Medicare Other | Admitting: Pharmacist

## 2015-05-23 ENCOUNTER — Ambulatory Visit (INDEPENDENT_AMBULATORY_CARE_PROVIDER_SITE_OTHER)
Admission: RE | Admit: 2015-05-23 | Discharge: 2015-05-23 | Disposition: A | Discharge status: Home / Self Care | Payer: Medicare Other | Source: Ambulatory Visit | Attending: Internal Medicine | Admitting: Internal Medicine

## 2015-05-23 ENCOUNTER — Ambulatory Visit (INDEPENDENT_AMBULATORY_CARE_PROVIDER_SITE_OTHER): Payer: Medicare Other | Admitting: Internal Medicine

## 2015-05-23 VITALS — BP 122/78 | HR 81 | Temp 98.4°F | Resp 20 | Wt 195.0 lb

## 2015-05-23 DIAGNOSIS — M25551 Pain in right hip: Secondary | ICD-10-CM | POA: Diagnosis not present

## 2015-05-23 DIAGNOSIS — I10 Essential (primary) hypertension: Secondary | ICD-10-CM | POA: Diagnosis not present

## 2015-05-23 DIAGNOSIS — I2699 Other pulmonary embolism without acute cor pulmonale: Secondary | ICD-10-CM

## 2015-05-23 DIAGNOSIS — M160 Bilateral primary osteoarthritis of hip: Secondary | ICD-10-CM | POA: Diagnosis not present

## 2015-05-23 DIAGNOSIS — Z7901 Long term (current) use of anticoagulants: Secondary | ICD-10-CM

## 2015-05-23 DIAGNOSIS — R109 Unspecified abdominal pain: Secondary | ICD-10-CM

## 2015-05-23 DIAGNOSIS — Z5181 Encounter for therapeutic drug level monitoring: Secondary | ICD-10-CM | POA: Diagnosis not present

## 2015-05-23 DIAGNOSIS — I82409 Acute embolism and thrombosis of unspecified deep veins of unspecified lower extremity: Secondary | ICD-10-CM

## 2015-05-23 LAB — URINALYSIS, ROUTINE W REFLEX MICROSCOPIC
BILIRUBIN URINE: NEGATIVE
Hgb urine dipstick: NEGATIVE
Ketones, ur: NEGATIVE
Leukocytes, UA: NEGATIVE
Nitrite: NEGATIVE
PH: 8 (ref 5.0–8.0)
RBC / HPF: NONE SEEN (ref 0–?)
Specific Gravity, Urine: 1.01 (ref 1.000–1.030)
Total Protein, Urine: NEGATIVE
UROBILINOGEN UA: 0.2 (ref 0.0–1.0)
Urine Glucose: NEGATIVE
WBC UA: NONE SEEN (ref 0–?)

## 2015-05-23 LAB — POCT INR: INR: 3.3

## 2015-05-23 MED ORDER — CELECOXIB 100 MG PO CAPS: 100.0000 mg | ORAL_CAPSULE | Freq: Two times a day (BID) | ORAL | Status: DC | PRN

## 2015-05-23 NOTE — Progress Notes (Signed)
Subjective:    Patient ID: Kevin Hale, male    DOB: 1941-01-30, 75 y.o.   MRN: [id number]  HPI Here with 1 wk onset right lower lateral side pain localized to just above the ischium, somewhat sore to touch but no rash, swelling or skin change, mild, intermittent, Denies urinary symptoms such as dysuria, frequency, urgency, flank pain, hematuria or n/v, fever, chills.  Denies worsening reflux, abd pain, dysphagia, n/v, bowel change or blood except for the above.   Wt Readings from Last 3 Encounters:  05/23/15 195 lb (88.451 kg)  01/03/15 196 lb 1.9 oz (88.959 kg)  12/31/14 199 lb (90.266 kg)  Is worse to twisting at the waist, and walking, better to sit and rest.  Past Medical History  Diagnosis Date  . History of DVT (deep vein thrombosis)   . History of colon polyps   . BPH (benign prostatic hyperplasia)   . Hypercholesteremia   . HTN (hypertension)   . Diverticulosis of colon   . DJD (degenerative joint disease)     left foot, "pt denies"  . Cancer of prostate ([place])   . Plantar fasciitis   . Glucose intolerance (impaired glucose tolerance) 11/03/2010  . Anxiety   . Lumbar degenerative disc disease 11/12/2011  . Atherosclerosis of abdominal aorta ([place]) 11/12/2011   Past Surgical History  Procedure Laterality Date  . Uvulectomy    . Insertion prostate radiation seed      reports that he has quit smoking. He has never used smokeless tobacco. He reports that he does not drink alcohol or use illicit drugs. family history includes Deep vein thrombosis in his mother; Diabetes in his mother; Healthy in his brother and brother. Allergies  Allergen Reactions  . Pravastatin Other (See Comments)    myalgia   Current Outpatient Prescriptions on File Prior to Visit  Medication Sig Dispense Refill  . allopurinol (ZYLOPRIM) 100 MG tablet TAKE ONE TABLET BY MOUTH ONCE DAILY 90 tablet 0  . b complex vitamins tablet Take 1 tablet by mouth daily.    . Cholecalciferol (VITAMIN D3) 1000 UNITS  CAPS Take 1 Int'l Units by mouth daily.    . folic acid (FOLVITE) [id number] MCG tablet Take 400 mcg by mouth daily.    Marland Kitchen lisinopril (PRINIVIL,ZESTRIL) 5 MG tablet TAKE ONE TABLET BY MOUTH ONCE DAILY 90 tablet 3  . Probiotic Product (PROBIOTIC DAILY PO) Take 1 tablet by mouth daily.    . simvastatin (ZOCOR) 20 MG tablet Take 1 tablet (20 mg total) by mouth at bedtime. 90 tablet 3  . warfarin (COUMADIN) 5 MG tablet 1 tablet everyday except 1.5 tablets on Tuesdays,Thursdays and Sundays or as directed by coumadin clinic 120 tablet 1   No current facility-administered medications on file prior to visit.   Review of Systems  Constitutional: Negative for unusual diaphoresis or night sweats HENT: Negative for ear swelling or discharge Eyes: Negative for worsening visual haziness  Respiratory: Negative for choking and stridor.   Gastrointestinal: Negative for distension or worsening eructation Genitourinary: Negative for retention or change in urine volume.  Musculoskeletal: Negative for other MSK pain or swelling Skin: Negative for color change and worsening wound Neurological: Negative for tremors and numbness other than noted  Psychiatric/Behavioral: Negative for decreased concentration or agitation other than above       Objective:   Physical Exam BP 122/78 mmHg  Pulse 81  Temp(Src) 98.4 F (36.9 C) (Oral)  Resp 20  Wt 195 lb (88.451 kg)  SpO2  97% VS noted,  Constitutional: Pt appears in no apparent distress HENT: Head: NCAT.  Right Ear: External ear normal.  Left Ear: External ear normal.  Eyes: . Pupils are equal, round, and reactive to light. Conjunctivae and EOM are normal Neck: Normal range of motion. Neck supple.  Cardiovascular: Normal rate and regular rhythm.   Pulmonary/Chest: Effort normal and breath sounds without rales or wheezing.  Abd:  Soft, NT, ND, + BS, except for mild soreness/tender to area right low lateraal side just above the ischium Right hip with reduced ROM on  flexion with pain elicited to internal rotation Neurological: Pt is alert. Not confused , motor grossly intact Skin: Skin is warm. No rash, no LE edema Psychiatric: Pt behavior is normal. No agitation.     Assessment & Plan:

## 2015-05-23 NOTE — Assessment & Plan Note (Signed)
stable overall by history and exam, recent data reviewed with pt, and pt to continue medical treatment as before,  to f/u any worsening symptoms or concerns BP Readings from Last 3 Encounters:  05/23/15 122/78  05/18/15 145/78  01/03/15 140/70

## 2015-05-23 NOTE — Progress Notes (Signed)
Pre visit review using our clinic review tool, if applicable. No additional management support is needed unless otherwise documented below in the visit note. 

## 2015-05-23 NOTE — Assessment & Plan Note (Addendum)
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

## 2015-05-23 NOTE — Patient Instructions (Signed)
Please take all new medication as prescribed  - the generic celebrex (which is ok to take with the coumadin)  Please continue all other medications as before, and refills have been done if requested.  Please have the pharmacy call with any other refills you may need.  Please keep your appointments with your specialists as you may have planned  Please go to the XRAY Department in the Basement (go straight as you get off the elevator) for the x-ray testing  Please go to the LAB in the Basement (turn left off the elevator) for the tests to be done today - just the urine testing  You will be contacted by phone if any changes need to be made immediately.  Otherwise, you will receive a letter about your results with an explanation, but please check with MyChart first.  Please remember to sign up for MyChart if you have not done so, as this will be important to you in the future with finding out test results, communicating by private email, and scheduling acute appointments online when needed.

## 2015-05-23 NOTE — Assessment & Plan Note (Signed)
?   MSK, for UA but suspect muscular strain,  to f/u any worsening symptoms or concerns

## 2015-05-27 ENCOUNTER — Emergency Department (HOSPITAL_BASED_OUTPATIENT_CLINIC_OR_DEPARTMENT_OTHER)
Admission: EM | Admit: 2015-05-27 | Discharge: 2015-05-27 | Disposition: A | Discharge status: Home / Self Care | Payer: Medicare Other | Attending: Emergency Medicine | Admitting: Emergency Medicine

## 2015-05-27 ENCOUNTER — Emergency Department (HOSPITAL_BASED_OUTPATIENT_CLINIC_OR_DEPARTMENT_OTHER): Payer: Medicare Other

## 2015-05-27 ENCOUNTER — Encounter (HOSPITAL_BASED_OUTPATIENT_CLINIC_OR_DEPARTMENT_OTHER): Payer: Self-pay | Admitting: Emergency Medicine

## 2015-05-27 DIAGNOSIS — C61 Malignant neoplasm of prostate: Secondary | ICD-10-CM | POA: Diagnosis not present

## 2015-05-27 DIAGNOSIS — Z7901 Long term (current) use of anticoagulants: Secondary | ICD-10-CM | POA: Insufficient documentation

## 2015-05-27 DIAGNOSIS — Z87891 Personal history of nicotine dependence: Secondary | ICD-10-CM | POA: Insufficient documentation

## 2015-05-27 DIAGNOSIS — J111 Influenza due to unidentified influenza virus with other respiratory manifestations: Secondary | ICD-10-CM | POA: Diagnosis not present

## 2015-05-27 DIAGNOSIS — Z8719 Personal history of other diseases of the digestive system: Secondary | ICD-10-CM | POA: Insufficient documentation

## 2015-05-27 DIAGNOSIS — R0602 Shortness of breath: Secondary | ICD-10-CM | POA: Diagnosis not present

## 2015-05-27 DIAGNOSIS — Z86718 Personal history of other venous thrombosis and embolism: Secondary | ICD-10-CM | POA: Diagnosis not present

## 2015-05-27 DIAGNOSIS — Z87438 Personal history of other diseases of male genital organs: Secondary | ICD-10-CM | POA: Diagnosis not present

## 2015-05-27 DIAGNOSIS — Z86711 Personal history of pulmonary embolism: Secondary | ICD-10-CM | POA: Insufficient documentation

## 2015-05-27 DIAGNOSIS — I10 Essential (primary) hypertension: Secondary | ICD-10-CM | POA: Diagnosis not present

## 2015-05-27 DIAGNOSIS — Z8639 Personal history of other endocrine, nutritional and metabolic disease: Secondary | ICD-10-CM | POA: Insufficient documentation

## 2015-05-27 DIAGNOSIS — Z8659 Personal history of other mental and behavioral disorders: Secondary | ICD-10-CM | POA: Insufficient documentation

## 2015-05-27 DIAGNOSIS — Z86010 Personal history of colonic polyps: Secondary | ICD-10-CM | POA: Insufficient documentation

## 2015-05-27 DIAGNOSIS — Z79899 Other long term (current) drug therapy: Secondary | ICD-10-CM | POA: Diagnosis not present

## 2015-05-27 DIAGNOSIS — R5383 Other fatigue: Secondary | ICD-10-CM | POA: Diagnosis present

## 2015-05-27 DIAGNOSIS — Z8739 Personal history of other diseases of the musculoskeletal system and connective tissue: Secondary | ICD-10-CM | POA: Insufficient documentation

## 2015-05-27 DIAGNOSIS — R69 Illness, unspecified: Secondary | ICD-10-CM

## 2015-05-27 DIAGNOSIS — R791 Abnormal coagulation profile: Secondary | ICD-10-CM | POA: Diagnosis not present

## 2015-05-27 LAB — CBC WITH DIFFERENTIAL/PLATELET
BASOS PCT: 1 %
Basophils Absolute: 0 10*3/uL (ref 0.0–0.1)
Eosinophils Absolute: 0.1 10*3/uL (ref 0.0–0.7)
Eosinophils Relative: 1 %
HEMATOCRIT: 50.3 % (ref 39.0–52.0)
HEMOGLOBIN: 16.7 g/dL (ref 13.0–17.0)
LYMPHS ABS: 1.6 10*3/uL (ref 0.7–4.0)
LYMPHS PCT: 23 %
MCH: 28.8 pg (ref 26.0–34.0)
MCHC: 33.2 g/dL (ref 30.0–36.0)
MCV: 86.9 fL (ref 78.0–100.0)
MONOS PCT: 13 %
Monocytes Absolute: 0.9 10*3/uL (ref 0.1–1.0)
NEUTROS ABS: 4.2 10*3/uL (ref 1.7–7.7)
NEUTROS PCT: 62 %
Platelets: 205 10*3/uL (ref 150–400)
RBC: 5.79 MIL/uL (ref 4.22–5.81)
RDW: 13.8 % (ref 11.5–15.5)
WBC: 6.7 10*3/uL (ref 4.0–10.5)

## 2015-05-27 LAB — PROTIME-INR
INR: 1.51 — AB (ref 0.00–1.49)
Prothrombin Time: 18.3 seconds — ABNORMAL HIGH (ref 11.6–15.2)

## 2015-05-27 LAB — BASIC METABOLIC PANEL
ANION GAP: 6 (ref 5–15)
BUN: 13 mg/dL (ref 6–20)
CALCIUM: 8.9 mg/dL (ref 8.9–10.3)
CHLORIDE: 105 mmol/L (ref 101–111)
CO2: 26 mmol/L (ref 22–32)
Creatinine, Ser: 1.22 mg/dL (ref 0.61–1.24)
GFR calc non Af Amer: 56 mL/min — ABNORMAL LOW (ref >60)
Glucose, Bld: 94 mg/dL (ref 65–99)
POTASSIUM: 4.2 mmol/L (ref 3.5–5.1)
Sodium: 137 mmol/L (ref 135–145)

## 2015-05-27 LAB — TROPONIN I: Troponin I: <0.03 ng/mL (ref <0.031)

## 2015-05-27 MED ORDER — ACETAMINOPHEN 500 MG PO TABS
1000.0000 mg | ORAL_TABLET | Freq: Once | ORAL | Status: AC
  Administered 2015-05-27: 1000 mg via ORAL
  Filled 2015-05-27: qty 2

## 2015-05-27 MED ORDER — SODIUM CHLORIDE 0.9 % IV BOLUS (SEPSIS)
1000.0000 mL | Freq: Once | INTRAVENOUS | Status: AC
  Administered 2015-05-27: 1000 mL via INTRAVENOUS

## 2015-05-27 NOTE — Discharge Instructions (Signed)
Influenza, Adult Take Tylenol every 4 hours as needed for aches or for temperature higher than 100.4 while awake. Make sure that you drink at least six 8 ounce glasses of water each day to stay well hydrated. Call Dr. Jenny Reichmann to arrange to be seen in the office if not feeling better by next week. Return if you feel worse for any reason Influenza (flu) is an infection in the mouth, nose, and throat (respiratory tract) caused by a virus. The flu can make you feel very ill. Influenza spreads easily from person to person (contagious).  HOME CARE   Only take medicines as told by your doctor.  Use a cool mist humidifier to make breathing easier.  Get plenty of rest until your fever goes away. This usually takes 3 to 4 days.  Drink enough fluids to keep your pee (urine) clear or pale yellow.  Cover your mouth and nose when you cough or sneeze.  Wash your hands well to avoid spreading the flu.  Stay home from work or school until your fever has been gone for at least 1 full day.  Get a flu shot every year. GET HELP RIGHT AWAY IF:   You have trouble breathing or feel short of breath.  Your skin or nails turn blue.  You have severe neck pain or stiffness.  You have a severe headache, facial pain, or earache.  Your fever gets worse or keeps coming back.  You feel sick to your stomach (nauseous), throw up (vomit), or have watery poop (diarrhea).  You have chest pain.  You have a deep cough that gets worse, or you cough up more thick spit (mucus). MAKE SURE YOU:   Understand these instructions.  Will watch your condition.  Will get help right away if you are not doing well or get worse.   This information is not intended to replace advice given to you by your health care provider. Make sure you discuss any questions you have with your health care provider.   Document Released: 11/22/2007 Document Revised: 03/05/2014 Document Reviewed: 05/14/2011 Elsevier Interactive Patient Education  [employer].

## 2015-05-27 NOTE — ED Provider Notes (Signed)
CSN: [id number]     Arrival date & time 05/27/15  33 History   First MD Initiated Contact with Patient 05/27/15 1642     Chief Complaint  Patient presents with  . Fatigue  . Generalized Body Aches     (Consider location/radiation/quality/duration/timing/severity/associated sxs/prior Treatment) HPI Complains of cough generalized bodyaches fatigue onset 5 days ago. Cough is nonproductive he was lightheaded yesterday. He denies abdominal pain or denies blood per rectum. Denies black stools. Other associated symptoms include sore throat and mild headache. No known fever. No other associated symptoms. Treated with cloracetin without relief. No other associated symptoms.Nothing makes symptoms better or worse Past Medical History  Diagnosis Date  . History of DVT (deep vein thrombosis)   . History of colon polyps   . BPH (benign prostatic hyperplasia)   . Hypercholesteremia   . HTN (hypertension)   . Diverticulosis of colon   . DJD (degenerative joint disease)     left foot, "pt denies"  . Cancer of prostate ([place])   . Plantar fasciitis   . Glucose intolerance (impaired glucose tolerance) 11/03/2010  . Anxiety   . Lumbar degenerative disc disease 11/12/2011  . Atherosclerosis of abdominal aorta ([place]) 11/12/2011  Pulmonary embolism Past Surgical History  Procedure Laterality Date  . Uvulectomy    . Insertion prostate radiation seed     Family History  Problem Relation Age of Onset  . Deep vein thrombosis Mother   . Diabetes Mother   . Healthy Brother   . Healthy Brother    Social History  Substance Use Topics  . Smoking status: Former Research scientist (life sciences)  . Smokeless tobacco: Never Used  . Alcohol Use: No    Review of Systems  Constitutional: Positive for chills.  HENT: Positive for sore throat.   Respiratory: Positive for cough and shortness of breath.   Cardiovascular: Negative.   Gastrointestinal: Negative.   Musculoskeletal: Positive for myalgias.  Skin: Negative.    Allergic/Immunologic: Positive for immunocompromised state.       Prostate cancer  Neurological: Positive for light-headedness.  Psychiatric/Behavioral: Negative.   All other systems reviewed and are negative.     Allergies  Pravastatin  Home Medications   Prior to Admission medications   Medication Sig Start Date End Date Taking? Authorizing Provider  allopurinol (ZYLOPRIM) 100 MG tablet TAKE ONE TABLET BY MOUTH ONCE DAILY 03/31/15   Biagio Borg, MD  b complex vitamins tablet Take 1 tablet by mouth daily.    Historical Provider, MD  celecoxib (CELEBREX) 100 MG capsule Take 1 capsule (100 mg total) by mouth 2 (two) times daily as needed. 05/23/15   Biagio Borg, MD  Cholecalciferol (VITAMIN D3) 1000 UNITS CAPS Take 1 Int'l Units by mouth daily.    Historical Provider, MD  folic acid (FOLVITE) Q000111Q MCG tablet Take 400 mcg by mouth daily.    Historical Provider, MD  lisinopril (PRINIVIL,ZESTRIL) 5 MG tablet TAKE ONE TABLET BY MOUTH ONCE DAILY 01/18/15   Fay Records, MD  Probiotic Product (PROBIOTIC DAILY PO) Take 1 tablet by mouth daily.    Historical Provider, MD  simvastatin (ZOCOR) 20 MG tablet Take 1 tablet (20 mg total) by mouth at bedtime. 05/11/14   Biagio Borg, MD  warfarin (COUMADIN) 5 MG tablet 1 tablet everyday except 1.5 tablets on Tuesdays,Thursdays and Sundays or as directed by coumadin clinic 03/08/14   Fay Records, MD   BP 135/74 mmHg  Pulse 89  Temp(Src) 99 F (37.2 C) (Oral)  Resp 18  Ht 5\' 9"  (1.753 m)  Wt 194 lb (87.998 kg)  BMI 28.64 kg/m2  SpO2 96% Physical Exam  Constitutional: He appears well-developed and well-nourished.  HENT:  Head: Normocephalic and atraumatic.  Eyes: Conjunctivae are normal. Pupils are equal, round, and reactive to light.  Neck: Neck supple. No tracheal deviation present. No thyromegaly present.  Cardiovascular: Normal rate and regular rhythm.   No murmur heard. Pulmonary/Chest: Effort normal and breath sounds normal.  Abdominal:  Soft. Bowel sounds are normal. He exhibits no distension. There is no tenderness.  Musculoskeletal: Normal range of motion. He exhibits no edema or tenderness.  Neurological: He is alert. No cranial nerve deficit. Coordination normal.  Gait normal not lightheaded on standing  Skin: Skin is warm and dry. No rash noted.  Psychiatric: He has a normal mood and affect.  Nursing note and vitals reviewed.   ED Course  Procedures (including critical care time) Labs Review Labs Reviewed - No data to display  Imaging Review Dg Chest 2 View  05/27/2015  CLINICAL DATA:  Short of breath EXAM: CHEST  2 VIEW COMPARISON:  07/29/2012.  07/19/2011. FINDINGS: Chronic interstitial markings within both lungs are stable. Normal heart size. No pneumothorax. No pleural effusion. Mild pectus excavatum. IMPRESSION: No active cardiopulmonary disease. Electronically Signed   By: Marybelle Killings M.D.   On: 05/27/2015 16:24   I have personally reviewed and evaluated these images and lab results as part of my medical decision-making.   EKG Interpretation   Date/Time:  Friday May 27 2015 17:12:47 EDT Ventricular Rate:  76 PR Interval:  161 QRS Duration: 106 QT Interval:  336 QTC Calculation: 378 R Axis:   -70 Text Interpretation:  Sinus rhythm LAE, consider biatrial enlargement Left  anterior fascicular block Abnormal R-wave progression, late transition  Left ventricular hypertrophy Borderline T abnormalities, lateral leads No  significant change since last tracing Confirmed by Winfred Leeds  MD, Dareon Nunziato  [phone]) on 05/27/2015 5:55:38 PM     Chest x-ray viewed by me Results for orders placed or performed during the hospital encounter of 05/27/15  Troponin I  Result Value Ref Range   Troponin I <0.03 <0.031 ng/mL  Basic metabolic panel  Result Value Ref Range   Sodium 137 135 - 145 mmol/L   Potassium 4.2 3.5 - 5.1 mmol/L   Chloride 105 101 - 111 mmol/L   CO2 26 22 - 32 mmol/L   Glucose, Bld 94 65 - 99 mg/dL    BUN 13 6 - 20 mg/dL   Creatinine, Ser 1.22 0.61 - 1.24 mg/dL   Calcium 8.9 8.9 - 10.3 mg/dL   GFR calc non Af Amer 56 (L) >60 mL/min   GFR calc Af Amer >60 >60 mL/min   Anion gap 6 5 - 15  CBC with Differential/Platelet  Result Value Ref Range   WBC 6.7 4.0 - 10.5 K/uL   RBC 5.79 4.22 - 5.81 MIL/uL   Hemoglobin 16.7 13.0 - 17.0 g/dL   HCT 50.3 39.0 - 52.0 %   MCV 86.9 78.0 - 100.0 fL   MCH 28.8 26.0 - 34.0 pg   MCHC 33.2 30.0 - 36.0 g/dL   RDW 13.8 11.5 - 15.5 %   Platelets 205 150 - 400 K/uL   Neutrophils Relative % 62 %   Neutro Abs 4.2 1.7 - 7.7 K/uL   Lymphocytes Relative 23 %   Lymphs Abs 1.6 0.7 - 4.0 K/uL   Monocytes Relative 13 %   Monocytes Absolute  0.9 0.1 - 1.0 K/uL   Eosinophils Relative 1 %   Eosinophils Absolute 0.1 0.0 - 0.7 K/uL   Basophils Relative 1 %   Basophils Absolute 0.0 0.0 - 0.1 K/uL  Protime-INR  Result Value Ref Range   Prothrombin Time 18.3 (H) 11.6 - 15.2 seconds   INR 1.51 (H) 0.00 - 1.49   Dg Chest 2 View  05/27/2015  CLINICAL DATA:  Short of breath EXAM: CHEST  2 VIEW COMPARISON:  07/29/2012.  07/19/2011. FINDINGS: Chronic interstitial markings within both lungs are stable. Normal heart size. No pneumothorax. No pleural effusion. Mild pectus excavatum. IMPRESSION: No active cardiopulmonary disease. Electronically Signed   By: Marybelle Killings M.D.   On: 05/27/2015 16:24   Dg Hips Bilat W Or W/o Pelvis 3-4 Views  05/23/2015  CLINICAL DATA:  Right lateral hip pain centered above the ischium for the past 10 days with limited range of motion common no known injury. EXAM: DG HIP (WITH OR WITHOUT PELVIS) 3-4V BILAT COMPARISON:  Bilateral hip series of March 16, 2014 FINDINGS: There is mild symmetric narrowing of the right hip joint space. On the left there is minimal symmetric narrowing. These findings are stable. The articular surface of the femoral heads and acetabuli remains smoothly rounded. The visualized portions of the hemi pelves are normal. There  are seed implants in the prostate bed. 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 The partially imaged hemipelves are normal. Electronically Signed   By: David  Martinique M.D.   On: 05/23/2015 13:34    6:25 PM Patient feels much improved after treatment with intravenous fluids and Tylenol    MDM  Patient suffering from influenza-like illness. INR is subtherapeutic. I suggest follow-up at Coumadin clinic in 3 days for possible adjustment of Coumadin dosage. Follow-up with Dr. Jenny Reichmann still has fever or not feeling improved by next week. Encourage oral hydration  Final diagnoses:  None   Diagnosis #1 influenza-like illness #2 subtherapeutic INR     Orlie Dakin, MD 05/27/15 [phone]

## 2015-05-27 NOTE — ED Notes (Signed)
Pt states he has had body aches with chills cough and weakness since last Thursday.

## 2015-05-27 NOTE — ED Notes (Signed)
MD at bedside. 

## 2015-05-27 NOTE — ED Notes (Signed)
Pt on heart monitor 

## 2015-06-07 ENCOUNTER — Encounter: Payer: Self-pay | Admitting: Internal Medicine

## 2015-06-07 ENCOUNTER — Ambulatory Visit (INDEPENDENT_AMBULATORY_CARE_PROVIDER_SITE_OTHER): Payer: Medicare Other | Admitting: Internal Medicine

## 2015-06-07 VITALS — BP 130/60 | HR 72 | Temp 98.5°F | Resp 20 | Wt 194.0 lb

## 2015-06-07 DIAGNOSIS — J111 Influenza due to unidentified influenza virus with other respiratory manifestations: Secondary | ICD-10-CM | POA: Diagnosis not present

## 2015-06-07 DIAGNOSIS — R7302 Impaired glucose tolerance (oral): Secondary | ICD-10-CM

## 2015-06-07 DIAGNOSIS — J1189 Influenza due to unidentified influenza virus with other manifestations: Secondary | ICD-10-CM

## 2015-06-07 DIAGNOSIS — I10 Essential (primary) hypertension: Secondary | ICD-10-CM

## 2015-06-07 MED ORDER — PREDNISONE 10 MG PO TABS: ORAL_TABLET | ORAL | Status: DC

## 2015-06-07 MED ORDER — MELOXICAM 7.5 MG PO TABS: 7.5000 mg | ORAL_TABLET | Freq: Two times a day (BID) | ORAL | Status: DC | PRN

## 2015-06-07 NOTE — Progress Notes (Signed)
Pre visit review using our clinic review tool, if applicable. No additional management support is needed unless otherwise documented below in the visit note. 

## 2015-06-07 NOTE — Patient Instructions (Signed)
Please take all new medication as prescribed - the prednisone for wheezing, and mobic (anti-inflammatory as needed for pain)  Please continue all other medications as before, and refills have been done if requested.  Please have the pharmacy call with any other refills you may need.  Please keep your appointments with your specialists as you may have planned

## 2015-06-07 NOTE — Progress Notes (Signed)
Subjective:    Patient ID: Kevin Hale, male    DOB: 1940-05-12, 75 y.o.   MRN: [id number]  HPI  Here to f/u recent ED visit mar 31, dx with flu like illness, also subtherapeutic INR.  Has f/u appt with coumadin clinic, no overt bleeding.  Unfortunately still c/o general weakness, mild non prod cough, but also mild wheezing/sob in last few days with low appetite. Pt denies chest pain, orthopnea, PND, increased LE swelling, palpitations, dizziness or syncope.  Pt denies new neurological symptoms such as new headache, or facial or extremity weakness or numbness   Pt denies polydipsia, polyuria   Pt denies wt loss, night sweats, or other constitutional symptoms Past Medical History  Diagnosis Date  . History of DVT (deep vein thrombosis)   . History of colon polyps   . BPH (benign prostatic hyperplasia)   . Hypercholesteremia   . HTN (hypertension)   . Diverticulosis of colon   . DJD (degenerative joint disease)     left foot, "pt denies"  . Cancer of prostate ([place])   . Plantar fasciitis   . Glucose intolerance (impaired glucose tolerance) 11/03/2010  . Anxiety   . Lumbar degenerative disc disease 11/12/2011  . Atherosclerosis of abdominal aorta ([place]) 11/12/2011   Past Surgical History  Procedure Laterality Date  . Uvulectomy    . Insertion prostate radiation seed      reports that he has quit smoking. He has never used smokeless tobacco. He reports that he does not drink alcohol or use illicit drugs. family history includes Deep vein thrombosis in his mother; Diabetes in his mother; Healthy in his brother and brother. Allergies  Allergen Reactions  . Pravastatin Other (See Comments)    myalgia   Current Outpatient Prescriptions on File Prior to Visit  Medication Sig Dispense Refill  . allopurinol (ZYLOPRIM) 100 MG tablet TAKE ONE TABLET BY MOUTH ONCE DAILY 90 tablet 0  . b complex vitamins tablet Take 1 tablet by mouth daily.    . celecoxib (CELEBREX) 100 MG capsule Take 1 capsule  (100 mg total) by mouth 2 (two) times daily as needed. 60 capsule 2  . Cholecalciferol (VITAMIN D3) 1000 UNITS CAPS Take 1 Int'l Units by mouth daily.    . folic acid (FOLVITE) Q000111Q MCG tablet Take 400 mcg by mouth daily.    Marland Kitchen lisinopril (PRINIVIL,ZESTRIL) 5 MG tablet TAKE ONE TABLET BY MOUTH ONCE DAILY 90 tablet 3  . Probiotic Product (PROBIOTIC DAILY PO) Take 1 tablet by mouth daily.    . simvastatin (ZOCOR) 20 MG tablet Take 1 tablet (20 mg total) by mouth at bedtime. 90 tablet 3  . warfarin (COUMADIN) 5 MG tablet 1 tablet everyday except 1.5 tablets on Tuesdays,Thursdays and Sundays or as directed by coumadin clinic 120 tablet 1   No current facility-administered medications on file prior to visit.   Review of Systems  Constitutional: Negative for unusual diaphoresis or night sweats HENT: Negative for ear swelling or discharge Eyes: Negative for worsening visual haziness  Respiratory: Negative for choking and stridor.   Gastrointestinal: Negative for distension or worsening eructation Genitourinary: Negative for retention or change in urine volume.  Musculoskeletal: Negative for other MSK pain or swelling Skin: Negative for color change and worsening wound Neurological: Negative for tremors and numbness other than noted  Psychiatric/Behavioral: Negative for decreased concentration or agitation other than above       Objective:   Physical Exam BP 130/60 mmHg  Pulse 72  Temp(Src)  98.5 F (36.9 C) (Oral)  Resp 20  Wt 194 lb (87.998 kg)  SpO2 98% VS noted, mild ill Constitutional: Pt appears in no apparent distress HENT: Head: NCAT.  Right Ear: External ear normal.  Left Ear: External ear normal.  Bilat tm's with mild erythema.  Max sinus areas non tender.  Pharynx with mild erythema, no exudate Eyes: . Pupils are equal, round, and reactive to light. Conjunctivae and EOM are normal Neck: Normal range of motion. Neck supple.  Cardiovascular: Normal rate and regular rhythm.     Pulmonary/Chest: Effort normal and breath sounds decreased without rales but few scattered wheezing.  Abd:  Soft, NT, ND, + BS Neurological: Pt is alert. Not confused , motor grossly intact Skin: Skin is warm. No rash, no LE edema Psychiatric: Pt behavior is normal. No agitation.     Assessment & Plan:

## 2015-06-10 NOTE — Assessment & Plan Note (Addendum)
With recent flu like illness unfortunately with persistent cough and now mild wheezing/sob, no evidence for pna by exam, declines cxr and inhaler, for predpac asd, anti inflammatory for general aches, to f/u any worsening symptoms or concerns

## 2015-06-10 NOTE — Assessment & Plan Note (Signed)
stable overall by history and exam, recent data reviewed with pt, and pt to continue medical treatment as before,  to f/u any worsening symptoms or concerns BP Readings from Last 3 Encounters:  06/07/15 130/60  05/27/15 133/73  05/23/15 122/78

## 2015-06-10 NOTE — Assessment & Plan Note (Signed)
Asympt, stable overall by history and exam, recent data reviewed with pt, and pt to continue medical treatment as before,  to f/u any worsening symptoms or concerns Lab Results  Component Value Date   HGBA1C 5.4 12/28/2014

## 2015-06-20 ENCOUNTER — Ambulatory Visit (INDEPENDENT_AMBULATORY_CARE_PROVIDER_SITE_OTHER): Payer: Medicare Other

## 2015-06-20 DIAGNOSIS — Z5181 Encounter for therapeutic drug level monitoring: Secondary | ICD-10-CM | POA: Diagnosis not present

## 2015-06-20 DIAGNOSIS — I2699 Other pulmonary embolism without acute cor pulmonale: Secondary | ICD-10-CM

## 2015-06-20 DIAGNOSIS — Z7901 Long term (current) use of anticoagulants: Secondary | ICD-10-CM | POA: Diagnosis not present

## 2015-06-20 DIAGNOSIS — I82409 Acute embolism and thrombosis of unspecified deep veins of unspecified lower extremity: Secondary | ICD-10-CM

## 2015-06-20 LAB — POCT INR: INR: 3.5

## 2015-06-28 ENCOUNTER — Other Ambulatory Visit (INDEPENDENT_AMBULATORY_CARE_PROVIDER_SITE_OTHER): Payer: Medicare Other

## 2015-06-28 ENCOUNTER — Ambulatory Visit (INDEPENDENT_AMBULATORY_CARE_PROVIDER_SITE_OTHER): Payer: Medicare Other | Admitting: Internal Medicine

## 2015-06-28 ENCOUNTER — Encounter: Payer: Self-pay | Admitting: Internal Medicine

## 2015-06-28 VITALS — BP 136/82 | HR 86 | Temp 98.6°F | Resp 20 | Wt 196.0 lb

## 2015-06-28 DIAGNOSIS — E7800 Pure hypercholesterolemia, unspecified: Secondary | ICD-10-CM | POA: Diagnosis not present

## 2015-06-28 DIAGNOSIS — Z8546 Personal history of malignant neoplasm of prostate: Secondary | ICD-10-CM | POA: Diagnosis not present

## 2015-06-28 DIAGNOSIS — R6889 Other general symptoms and signs: Secondary | ICD-10-CM

## 2015-06-28 DIAGNOSIS — I10 Essential (primary) hypertension: Secondary | ICD-10-CM

## 2015-06-28 DIAGNOSIS — Z0001 Encounter for general adult medical examination with abnormal findings: Secondary | ICD-10-CM

## 2015-06-28 DIAGNOSIS — R7302 Impaired glucose tolerance (oral): Secondary | ICD-10-CM

## 2015-06-28 LAB — HEMOGLOBIN A1C: Hgb A1c MFr Bld: 5.7 % (ref 4.6–6.5)

## 2015-06-28 LAB — BASIC METABOLIC PANEL
BUN: 10 mg/dL (ref 6–23)
CHLORIDE: 105 meq/L (ref 96–112)
CO2: 27 mEq/L (ref 19–32)
Calcium: 9.5 mg/dL (ref 8.4–10.5)
Creatinine, Ser: 1 mg/dL (ref 0.40–1.50)
GFR: 93.53 mL/min (ref >60.00)
Glucose, Bld: 144 mg/dL — ABNORMAL HIGH (ref 70–99)
POTASSIUM: 3.9 meq/L (ref 3.5–5.1)
Sodium: 138 mEq/L (ref 135–145)

## 2015-06-28 LAB — HEPATIC FUNCTION PANEL
ALT: 17 U/L (ref 0–53)
AST: 20 U/L (ref 0–37)
Albumin: 4 g/dL (ref 3.5–5.2)
Alkaline Phosphatase: 68 U/L (ref 39–117)
BILIRUBIN TOTAL: 0.7 mg/dL (ref 0.2–1.2)
Bilirubin, Direct: 0.2 mg/dL (ref 0.0–0.3)
Total Protein: 7.2 g/dL (ref 6.0–8.3)

## 2015-06-28 LAB — CBC WITH DIFFERENTIAL/PLATELET
BASOS PCT: 0.5 % (ref 0.0–3.0)
Basophils Absolute: 0 10*3/uL (ref 0.0–0.1)
EOS ABS: 0.2 10*3/uL (ref 0.0–0.7)
Eosinophils Relative: 2.6 % (ref 0.0–5.0)
HCT: 48.7 % (ref 39.0–52.0)
Hemoglobin: 16 g/dL (ref 13.0–17.0)
Lymphocytes Relative: 44.6 % (ref 12.0–46.0)
Lymphs Abs: 2.6 10*3/uL (ref 0.7–4.0)
MCHC: 32.9 g/dL (ref 30.0–36.0)
MCV: 87.9 fl (ref 78.0–100.0)
MONO ABS: 0.5 10*3/uL (ref 0.1–1.0)
Monocytes Relative: 9 % (ref 3.0–12.0)
NEUTROS ABS: 2.6 10*3/uL (ref 1.4–7.7)
NEUTROS PCT: 43.3 % (ref 43.0–77.0)
PLATELETS: 197 10*3/uL (ref 150.0–400.0)
RBC: 5.54 Mil/uL (ref 4.22–5.81)
RDW: 13.8 % (ref 11.5–15.5)
WBC: 5.9 10*3/uL (ref 4.0–10.5)

## 2015-06-28 LAB — LIPID PANEL
CHOL/HDL RATIO: 2
CHOLESTEROL: 108 mg/dL (ref 0–200)
HDL: 46.8 mg/dL (ref >39.00)
LDL Cholesterol: 43 mg/dL (ref 0–99)
NonHDL: 60.91
TRIGLYCERIDES: 90 mg/dL (ref 0.0–149.0)
VLDL: 18 mg/dL (ref 0.0–40.0)

## 2015-06-28 LAB — TSH: TSH: 0.95 u[IU]/mL (ref 0.35–4.50)

## 2015-06-28 NOTE — Patient Instructions (Signed)

## 2015-06-28 NOTE — Assessment & Plan Note (Signed)
stable overall by history and exam, recent data reviewed with pt, and pt to continue medical treatment as before,  to f/u any worsening symptoms or concerns BP Readings from Last 3 Encounters:  06/28/15 136/82  06/07/15 130/60  05/27/15 133/73

## 2015-06-28 NOTE — Progress Notes (Signed)
Subjective:    Patient ID: Kevin Hale, male    DOB: April 09, 1940, 75 y.o.   MRN: [id number]  HPI  Here for wellness and f/u;  Overall doing ok;  Pt denies Chest pain, worsening SOB, DOE, wheezing, orthopnea, PND, worsening LE edema, palpitations, dizziness or syncope.  Pt denies neurological change such as new headache, facial or extremity weakness.  Pt denies polydipsia, polyuria, or low sugar symptoms. Pt states overall good compliance with treatment and medications, good tolerability, and has been trying to follow appropriate diet.  Pt denies worsening depressive symptoms, suicidal ideation or panic. No fever, night sweats, wt loss, loss of appetite, or other constitutional symptoms.  Pt states good ability with ADL's, has low fall risk, home safety reviewed and adequate, no other significant changes in hearing or vision, and only occasionally active with exercise. Parents lived to 7 and 100.  Due for f/u colonoscopy next year. No specific complaints.  Next due prostate ca f/u with psa with urology dec 2017 Past Medical History  Diagnosis Date  . History of DVT (deep vein thrombosis)   . History of colon polyps   . BPH (benign prostatic hyperplasia)   . Hypercholesteremia   . HTN (hypertension)   . Diverticulosis of colon   . DJD (degenerative joint disease)     left foot, "pt denies"  . Cancer of prostate ([place])   . Plantar fasciitis   . Glucose intolerance (impaired glucose tolerance) 11/03/2010  . Anxiety   . Lumbar degenerative disc disease 11/12/2011  . Atherosclerosis of abdominal aorta ([place]) 11/12/2011   Past Surgical History  Procedure Laterality Date  . Uvulectomy    . Insertion prostate radiation seed      reports that he has quit smoking. He has never used smokeless tobacco. He reports that he does not drink alcohol or use illicit drugs. family history includes Deep vein thrombosis in his mother; Diabetes in his mother; Healthy in his brother and brother. Allergies    Allergen Reactions  . Pravastatin Other (See Comments)    myalgia   Current Outpatient Prescriptions on File Prior to Visit  Medication Sig Dispense Refill  . allopurinol (ZYLOPRIM) 100 MG tablet TAKE ONE TABLET BY MOUTH ONCE DAILY 90 tablet 0  . b complex vitamins tablet Take 1 tablet by mouth daily.    . Cholecalciferol (VITAMIN D3) 1000 UNITS CAPS Take 1 Int'l Units by mouth daily.    . folic acid (FOLVITE) Q000111Q MCG tablet Take 400 mcg by mouth daily.    Marland Kitchen lisinopril (PRINIVIL,ZESTRIL) 5 MG tablet TAKE ONE TABLET BY MOUTH ONCE DAILY 90 tablet 3  . meloxicam (MOBIC) 7.5 MG tablet Take 1 tablet (7.5 mg total) by mouth 2 (two) times daily as needed for pain. 60 tablet 3  . predniSONE (DELTASONE) 10 MG tablet 3 tabs by mouth per day for 3 days,2tabs per day for 3 days,1tab per day for 3 days 18 tablet 0  . Probiotic Product (PROBIOTIC DAILY PO) Take 1 tablet by mouth daily.    . simvastatin (ZOCOR) 20 MG tablet Take 1 tablet (20 mg total) by mouth at bedtime. 90 tablet 3  . warfarin (COUMADIN) 5 MG tablet 1 tablet everyday except 1.5 tablets on Tuesdays,Thursdays and Sundays or as directed by coumadin clinic 120 tablet 1   No current facility-administered medications on file prior to visit.   Review of Systems Constitutional: Negative for increased diaphoresis, or other activity, appetite or siginficant weight change other than noted HENT:  Negative for worsening hearing loss, ear pain, facial swelling, mouth sores and neck stiffness.   Eyes: Negative for other worsening pain, redness or visual disturbance.  Respiratory: Negative for choking or stridor Cardiovascular: Negative for other chest pain and palpitations.  Gastrointestinal: Negative for worsening diarrhea, blood in stool, or abdominal distention Genitourinary: Negative for hematuria, flank pain or change in urine volume.  Musculoskeletal: Negative for myalgias or other joint complaints.  Skin: Negative for other color change and  wound or drainage.  Neurological: Negative for syncope and numbness. other than noted Hematological: Negative for adenopathy. or other swelling Psychiatric/Behavioral: Negative for hallucinations, SI, self-injury, decreased concentration or other worsening agitation.      Objective:   Physical Exam BP 136/82 mmHg  Pulse 86  Temp(Src) 98.6 F (37 C) (Oral)  Resp 20  Wt 196 lb (88.905 kg)  SpO2 97% VS noted,  Constitutional: Pt is oriented to person, place, and time. Appears well-developed and well-nourished, in no significant distress Head: Normocephalic and atraumatic  Eyes: Conjunctivae and EOM are normal. Pupils are equal, round, and reactive to light Right Ear: External ear normal.  Left Ear: External ear normal Nose: Nose normal.  Mouth/Throat: Oropharynx is clear and moist  Neck: Normal range of motion. Neck supple. No JVD present. No tracheal deviation present or significant neck LA or mass Cardiovascular: Normal rate, regular rhythm, normal heart sounds and intact distal pulses.   Pulmonary/Chest: Effort normal and breath sounds without rales or wheezing  Abdominal: Soft. Bowel sounds are normal. NT. No HSM  Musculoskeletal: Normal range of motion. Exhibits no edema Lymphadenopathy: Has no cervical adenopathy.  Neurological: Pt is alert and oriented to person, place, and time. Pt has normal reflexes. No cranial nerve deficit. Motor grossly intact Skin: Skin is warm and dry. No rash noted or new ulcers Psychiatric:  Has normal mood and affect. Behavior is normal.      Assessment & Plan:

## 2015-06-28 NOTE — Assessment & Plan Note (Signed)

## 2015-06-28 NOTE — Assessment & Plan Note (Signed)
stable overall by history and exam, recent data reviewed with pt, and pt to continue medical treatment as before,  to f/u any worsening symptoms or concerns Lab Results  Component Value Date   LDLCALC 39 12/28/2014

## 2015-06-28 NOTE — Assessment & Plan Note (Signed)
Due for f/u with urology with PSA about dec 2017,  to f/u any worsening symptoms or concerns

## 2015-06-28 NOTE — Progress Notes (Signed)
Pre visit review using our clinic review tool, if applicable. No additional management support is needed unless otherwise documented below in the visit note. 

## 2015-06-28 NOTE — Assessment & Plan Note (Signed)
.  stable overall by history and exam, recent data reviewed with pt, and pt to continue medical treatment as before,  to f/u any worsening symptoms or concerns Lab Results  Component Value Date   HGBA1C 5.4 12/28/2014

## 2015-06-29 ENCOUNTER — Other Ambulatory Visit: Payer: Self-pay | Admitting: Internal Medicine

## 2015-07-11 ENCOUNTER — Ambulatory Visit (INDEPENDENT_AMBULATORY_CARE_PROVIDER_SITE_OTHER): Payer: Medicare Other

## 2015-07-11 DIAGNOSIS — I2699 Other pulmonary embolism without acute cor pulmonale: Secondary | ICD-10-CM

## 2015-07-11 DIAGNOSIS — I82409 Acute embolism and thrombosis of unspecified deep veins of unspecified lower extremity: Secondary | ICD-10-CM

## 2015-07-11 DIAGNOSIS — Z7901 Long term (current) use of anticoagulants: Secondary | ICD-10-CM | POA: Diagnosis not present

## 2015-07-11 DIAGNOSIS — Z5181 Encounter for therapeutic drug level monitoring: Secondary | ICD-10-CM

## 2015-07-11 LAB — POCT INR: INR: 2.7

## 2015-07-20 ENCOUNTER — Other Ambulatory Visit: Payer: Self-pay | Admitting: Internal Medicine

## 2015-07-29 ENCOUNTER — Ambulatory Visit (INDEPENDENT_AMBULATORY_CARE_PROVIDER_SITE_OTHER): Payer: Medicare Other | Admitting: Internal Medicine

## 2015-07-29 ENCOUNTER — Encounter: Payer: Self-pay | Admitting: Internal Medicine

## 2015-07-29 VITALS — BP 128/68 | HR 80 | Temp 98.9°F | Resp 20 | Wt 196.0 lb

## 2015-07-29 DIAGNOSIS — R7302 Impaired glucose tolerance (oral): Secondary | ICD-10-CM

## 2015-07-29 DIAGNOSIS — R531 Weakness: Secondary | ICD-10-CM | POA: Diagnosis not present

## 2015-07-29 DIAGNOSIS — I10 Essential (primary) hypertension: Secondary | ICD-10-CM | POA: Diagnosis not present

## 2015-07-29 DIAGNOSIS — J019 Acute sinusitis, unspecified: Secondary | ICD-10-CM | POA: Diagnosis not present

## 2015-07-29 MED ORDER — HYDROCODONE-HOMATROPINE 5-1.5 MG/5ML PO SYRP: 5.0000 mL | ORAL_SOLUTION | Freq: Four times a day (QID) | ORAL | Status: DC | PRN

## 2015-07-29 MED ORDER — LEVOFLOXACIN 500 MG PO TABS: 500.0000 mg | ORAL_TABLET | Freq: Every day | ORAL | Status: DC

## 2015-07-29 NOTE — Progress Notes (Signed)
Subjective:    Patient ID: Kevin Hale, male    DOB: Aug 11, 1940, 75 y.o.   MRN: [id number]  HPI   Here with 2-3 days acute onset fever, facial pain, pressure, headache, general weakness and malaise, and greenish d/c, with mild ST and cough, but pt denies chest pain, wheezing, increased sob or doe, orthopnea, PND, increased LE swelling, palpitations, dizziness or syncope.  Has to lie on exam table for a short time during visit for rest.  Pt denies new neurological symptoms such as new headache, or facial or extremity weakness or numbness   Pt denies polydipsia, polyuria  Denies urinary symptoms such as dysuria, frequency, urgency, flank pain, hematuria or n/v, fever, chills. Denies worsening reflux, abd pain, dysphagia, n/v, bowel change or blood.  No overt bleeding on the coumadin Past Medical History  Diagnosis Date  . History of DVT (deep vein thrombosis)   . History of colon polyps   . BPH (benign prostatic hyperplasia)   . Hypercholesteremia   . HTN (hypertension)   . Diverticulosis of colon   . DJD (degenerative joint disease)     left foot, "pt denies"  . Cancer of prostate ([place])   . Plantar fasciitis   . Glucose intolerance (impaired glucose tolerance) 11/03/2010  . Anxiety   . Lumbar degenerative disc disease 11/12/2011  . Atherosclerosis of abdominal aorta ([place]) 11/12/2011   Past Surgical History  Procedure Laterality Date  . Uvulectomy    . Insertion prostate radiation seed      reports that he has quit smoking. He has never used smokeless tobacco. He reports that he does not drink alcohol or use illicit drugs. family history includes Deep vein thrombosis in his mother; Diabetes in his mother; Healthy in his brother and brother. Allergies  Allergen Reactions  . Pravastatin Other (See Comments)    myalgia   Current Outpatient Prescriptions on File Prior to Visit  Medication Sig Dispense Refill  . allopurinol (ZYLOPRIM) 100 MG tablet TAKE ONE TABLET BY MOUTH ONCE DAILY 90  tablet 0  . b complex vitamins tablet Take 1 tablet by mouth daily.    . Cholecalciferol (VITAMIN D3) 1000 UNITS CAPS Take 1 Int'l Units by mouth daily.    . folic acid (FOLVITE) Q000111Q MCG tablet Take 400 mcg by mouth daily.    Marland Kitchen lisinopril (PRINIVIL,ZESTRIL) 5 MG tablet TAKE ONE TABLET BY MOUTH ONCE DAILY 90 tablet 3  . predniSONE (DELTASONE) 10 MG tablet 3 tabs by mouth per day for 3 days,2tabs per day for 3 days,1tab per day for 3 days 18 tablet 0  . Probiotic Product (PROBIOTIC DAILY PO) Take 1 tablet by mouth daily.    . simvastatin (ZOCOR) 20 MG tablet TAKE ONE TABLET BY MOUTH AT BEDTIME 90 tablet 0  . warfarin (COUMADIN) 5 MG tablet 1 tablet everyday except 1.5 tablets on Tuesdays,Thursdays and Sundays or as directed by coumadin clinic 120 tablet 1   No current facility-administered medications on file prior to visit.   Review of Systems  Constitutional: Negative for unusual diaphoresis or night sweats HENT: Negative for ear swelling or discharge Eyes: Negative for worsening visual haziness  Respiratory: Negative for choking and stridor.   Gastrointestinal: Negative for distension or worsening eructation Genitourinary: Negative for retention or change in urine volume.  Musculoskeletal: Negative for other MSK pain or swelling Skin: Negative for color change and worsening wound Neurological: Negative for tremors and numbness other than noted  Psychiatric/Behavioral: Negative for decreased concentration or agitation  other than above       Objective:   Physical Exam BP 128/68 mmHg  Pulse 80  Temp(Src) 98.9 F (37.2 C) (Oral)  Resp 20  Wt 196 lb (88.905 kg)  SpO2 97% VS noted,  Constitutional: Pt appears in no apparent distress but general weakness HENT: Head: NCAT.  Right Ear: External ear normal.  Left Ear: External ear normal.  Bilat tm's with mild erythema.  Max sinus areas mild tender.  Pharynx with mild erythema, no exudate Eyes: . Pupils are equal, round, and reactive  to light. Conjunctivae and EOM are normal Neck: Normal range of motion. Neck supple.  Cardiovascular: Normal rate and regular rhythm.   Pulmonary/Chest: Effort normal and breath sounds without rales or wheezing.  Abd:  Soft, NT, ND, + BS Neurological: Pt is alert. Not confused , motor grossly intact, gait slightly unsteady compared to baseline Skin: Skin is warm. No rash, no LE edema Psychiatric: Pt behavior is normal. No agitation.     Assessment & Plan:

## 2015-07-29 NOTE — Assessment & Plan Note (Signed)
stable overall by history and exam, recent data reviewed with pt, and pt to continue medical treatment as before,  to f/u any worsening symptoms or concerns Lab Results  Component Value Date   HGBA1C 5.7 06/28/2015    

## 2015-07-29 NOTE — Assessment & Plan Note (Signed)
stable overall by history and exam, recent data reviewed with pt, and pt to continue medical treatment as before,  to f/u any worsening symptoms or concerns BP Readings from Last 3 Encounters:  07/29/15 128/68  06/28/15 136/82  06/07/15 130/60

## 2015-07-29 NOTE — Progress Notes (Signed)
Pre visit review using our clinic review tool, if applicable. No additional management support is needed unless otherwise documented below in the visit note. 

## 2015-07-29 NOTE — Assessment & Plan Note (Signed)
Mild to mod, for antibx course,  to f/u any worsening symptoms or concerns 

## 2015-07-29 NOTE — Patient Instructions (Signed)
Please take all new medication as prescribed - the antibiotic, and cough medicine  Please continue all other medications as before, and refills have been done if requested.  Please have the pharmacy call with any other refills you may need.  Please keep your appointments with your specialists as you may have planned      

## 2015-07-29 NOTE — Assessment & Plan Note (Signed)
Appears out of proportion to current illness, declines labs or cxr at this time,  to f/u any worsening symptoms or concerns

## 2015-08-01 ENCOUNTER — Ambulatory Visit (INDEPENDENT_AMBULATORY_CARE_PROVIDER_SITE_OTHER): Payer: Medicare Other | Admitting: *Deleted

## 2015-08-01 DIAGNOSIS — I2699 Other pulmonary embolism without acute cor pulmonale: Secondary | ICD-10-CM | POA: Diagnosis not present

## 2015-08-01 DIAGNOSIS — I82409 Acute embolism and thrombosis of unspecified deep veins of unspecified lower extremity: Secondary | ICD-10-CM

## 2015-08-01 DIAGNOSIS — Z7901 Long term (current) use of anticoagulants: Secondary | ICD-10-CM | POA: Diagnosis not present

## 2015-08-01 DIAGNOSIS — Z5181 Encounter for therapeutic drug level monitoring: Secondary | ICD-10-CM

## 2015-08-01 LAB — POCT INR: INR: 2.5

## 2015-08-29 ENCOUNTER — Ambulatory Visit (INDEPENDENT_AMBULATORY_CARE_PROVIDER_SITE_OTHER): Payer: Medicare Other | Admitting: *Deleted

## 2015-08-29 DIAGNOSIS — Z7901 Long term (current) use of anticoagulants: Secondary | ICD-10-CM | POA: Diagnosis not present

## 2015-08-29 DIAGNOSIS — I82409 Acute embolism and thrombosis of unspecified deep veins of unspecified lower extremity: Secondary | ICD-10-CM

## 2015-08-29 DIAGNOSIS — I2699 Other pulmonary embolism without acute cor pulmonale: Secondary | ICD-10-CM

## 2015-08-29 DIAGNOSIS — Z5181 Encounter for therapeutic drug level monitoring: Secondary | ICD-10-CM

## 2015-08-29 LAB — POCT INR: INR: 2.9

## 2015-09-21 NOTE — Progress Notes (Signed)
[facility] [place],  [address] Phone: [phone] Subjective:    I'm seeing this patient by the request  of:  Cathlean Cower, MD   CC: right hand pain follow up  [id number]  Kevin Hale is a 75 y.o. male coming in with complaint of right hand pain. Seen 9 months ago and did have a trigger finger. Did have an injection. Patient states that he was doing significantly better for quite some time. States that over the course last several weeks started to hurt and stick again. Denies any injury.  Past Medical History:  Diagnosis Date  . Anxiety   . Atherosclerosis of abdominal aorta ([place]) 11/12/2011  . BPH (benign prostatic hyperplasia)   . Cancer of prostate ([place])   . Diverticulosis of colon   . DJD (degenerative joint disease)    left foot, "pt denies"  . Glucose intolerance (impaired glucose tolerance) 11/03/2010  . History of colon polyps   . History of DVT (deep vein thrombosis)   . HTN (hypertension)   . Hypercholesteremia   . Lumbar degenerative disc disease 11/12/2011  . Plantar fasciitis    Past Surgical History:  Procedure Laterality Date  . INSERTION PROSTATE RADIATION SEED    . UVULECTOMY     Social History  Substance Use Topics  . Smoking status: Former Research scientist (life sciences)  . Smokeless tobacco: Never Used  . Alcohol use No   Allergies  Allergen Reactions  . Pravastatin Other (See Comments)    myalgia   Family History  Problem Relation Age of Onset  . Deep vein thrombosis Mother   . Diabetes Mother   . Healthy Brother   . Healthy Brother         Past medical history, social, surgical and family history all reviewed in electronic medical record.   Review of Systems: No headache, visual changes, nausea, vomiting, diarrhea, constipation, dizziness, abdominal pain, skin rash, fevers, chills, night sweats, weight loss, swollen lymph nodes, body aches, joint swelling, muscle aches, chest pain, shortness of breath, mood  changes.   Objective  There were no vitals taken for this visit.  General: No apparent distress alert and oriented x3 mood and affect normal, dressed appropriately.  HEENT: Pupils equal, extraocular movements intact  Respiratory: Patient's speak in full sentences and does not appear short of breath  Cardiovascular: No lower extremity edema, non tender, no erythema  Skin: Warm dry intact with no signs of infection or rash on extremities or on axial skeleton.  Abdomen: Soft nontender  Neuro: Cranial nerves II through XII are intact, neurovascularly intact in all extremities with 2+ DTRs and 2+ pulses.  Lymph: No lymphadenopathy of posterior or anterior cervical chain or axillae bilaterally.  Gait normal with good balance and coordination.  MSK:  Non tender with full range of motion and good stability and symmetric strength and tone of shoulders, elbows, wrist, hip, knee and ankles bilaterally. Arthritic changes of multiple joints. Hand exam shows the patient does have a trigger finger of the right ring finger. Patient does have tenderness over the nodule just proximal to the A2 pulley. Neurovascularly intact distally. Full strength of the hand. Mild arthritic changes of multiple joints of the hand including the DIP joints.   Limited musculoskeletal ultrasound was performed and interpreted by Hulan Saas, M  Limited ultrasound shows the patient does have a trigger nodule noted. Hypoechoic changes within the tendon sheath noted.  Impression: Trigger finger  Procedure: Real-time Ultrasound Guided  Injection of fourth right finger trigger nodule Device: GE Logiq E  Ultrasound guided injection is preferred based studies that show increased duration, increased effect, greater accuracy, decreased procedural pain, increased response rate, and decreased cost with ultrasound guided versus blind injection.  Verbal informed consent obtained.  Time-out conducted.  Noted no overlying erythema,  induration, or other signs of local infection.  Skin prepped in a sterile fashion.  Local anesthesia: Topical Ethyl chloride.  With sterile technique and under real time ultrasound guidance:  The 25-gauge 5/8 inch needle patient was injected with a total of 0.5 mL of 0.5% Marcaine and 0.5 mL of Kenalog 40 mg/dL Completed without difficulty  Pain immediately resolved suggesting accurate placement of the medication.  Advised to call if fevers/chills, erythema, induration, drainage, or persistent bleeding.  Images permanently stored and available for review in the ultrasound unit.  Impression: Technically successful ultrasound guided injection.    Impression and Recommendations:     This case required medical decision making of moderate complexity.

## 2015-09-22 ENCOUNTER — Other Ambulatory Visit: Payer: Self-pay

## 2015-09-22 ENCOUNTER — Encounter: Payer: Self-pay | Admitting: Family Medicine

## 2015-09-22 ENCOUNTER — Ambulatory Visit (INDEPENDENT_AMBULATORY_CARE_PROVIDER_SITE_OTHER): Payer: Medicare Other | Admitting: Family Medicine

## 2015-09-22 VITALS — BP 118/72 | HR 83 | Wt 199.0 lb

## 2015-09-22 DIAGNOSIS — M6530 Trigger finger, unspecified finger: Secondary | ICD-10-CM | POA: Diagnosis not present

## 2015-09-22 NOTE — Patient Instructions (Signed)
reat to see you  We injected the finger again. It did make it 9 months and hopefully we will get it a little longer.  Ice in 6 hours.  Possibly brace at night for next 2 weeks if you can tolerate As long as you do well see me when you need me.

## 2015-09-22 NOTE — Assessment & Plan Note (Signed)
Patient given injection today. We discussed icing regimen and home exercises. We discussed bracing at night as well as with some activities. We discussed which thinks to potentially avoid. Patient will do and icing protocol. Follow-up as needed.

## 2015-09-27 ENCOUNTER — Ambulatory Visit (INDEPENDENT_AMBULATORY_CARE_PROVIDER_SITE_OTHER): Payer: Medicare Other | Admitting: *Deleted

## 2015-09-27 DIAGNOSIS — I2699 Other pulmonary embolism without acute cor pulmonale: Secondary | ICD-10-CM | POA: Diagnosis not present

## 2015-09-27 DIAGNOSIS — I82409 Acute embolism and thrombosis of unspecified deep veins of unspecified lower extremity: Secondary | ICD-10-CM | POA: Diagnosis not present

## 2015-09-27 DIAGNOSIS — Z7901 Long term (current) use of anticoagulants: Secondary | ICD-10-CM

## 2015-09-27 DIAGNOSIS — Z5181 Encounter for therapeutic drug level monitoring: Secondary | ICD-10-CM | POA: Diagnosis not present

## 2015-09-27 LAB — POCT INR: INR: 2.9

## 2015-09-29 ENCOUNTER — Other Ambulatory Visit: Payer: Self-pay | Admitting: Internal Medicine

## 2015-10-22 ENCOUNTER — Other Ambulatory Visit: Payer: Self-pay | Admitting: Internal Medicine

## 2015-10-31 ENCOUNTER — Emergency Department (HOSPITAL_BASED_OUTPATIENT_CLINIC_OR_DEPARTMENT_OTHER)
Admission: EM | Admit: 2015-10-31 | Discharge: 2015-10-31 | Disposition: A | Discharge status: Home / Self Care | Payer: Medicare Other | Attending: Emergency Medicine | Admitting: Emergency Medicine

## 2015-10-31 ENCOUNTER — Emergency Department (HOSPITAL_BASED_OUTPATIENT_CLINIC_OR_DEPARTMENT_OTHER): Payer: Medicare Other

## 2015-10-31 ENCOUNTER — Encounter (HOSPITAL_BASED_OUTPATIENT_CLINIC_OR_DEPARTMENT_OTHER): Payer: Self-pay

## 2015-10-31 DIAGNOSIS — I10 Essential (primary) hypertension: Secondary | ICD-10-CM | POA: Insufficient documentation

## 2015-10-31 DIAGNOSIS — Z87891 Personal history of nicotine dependence: Secondary | ICD-10-CM | POA: Diagnosis not present

## 2015-10-31 DIAGNOSIS — Z8546 Personal history of malignant neoplasm of prostate: Secondary | ICD-10-CM | POA: Insufficient documentation

## 2015-10-31 DIAGNOSIS — R0602 Shortness of breath: Secondary | ICD-10-CM | POA: Diagnosis not present

## 2015-10-31 DIAGNOSIS — J40 Bronchitis, not specified as acute or chronic: Secondary | ICD-10-CM | POA: Diagnosis not present

## 2015-10-31 DIAGNOSIS — R05 Cough: Secondary | ICD-10-CM | POA: Diagnosis not present

## 2015-10-31 DIAGNOSIS — J029 Acute pharyngitis, unspecified: Secondary | ICD-10-CM | POA: Insufficient documentation

## 2015-10-31 DIAGNOSIS — Z79899 Other long term (current) drug therapy: Secondary | ICD-10-CM | POA: Diagnosis not present

## 2015-10-31 LAB — CBC WITH DIFFERENTIAL/PLATELET
BASOS PCT: 0 %
Basophils Absolute: 0 10*3/uL (ref 0.0–0.1)
EOS ABS: 0.2 10*3/uL (ref 0.0–0.7)
Eosinophils Relative: 3 %
HCT: 49.3 % (ref 39.0–52.0)
HEMOGLOBIN: 16.6 g/dL (ref 13.0–17.0)
Lymphocytes Relative: 32 %
Lymphs Abs: 3.1 10*3/uL (ref 0.7–4.0)
MCH: 28.7 pg (ref 26.0–34.0)
MCHC: 33.7 g/dL (ref 30.0–36.0)
MCV: 85.3 fL (ref 78.0–100.0)
MONOS PCT: 10 %
Monocytes Absolute: 1 10*3/uL (ref 0.1–1.0)
NEUTROS PCT: 55 %
Neutro Abs: 5.4 10*3/uL (ref 1.7–7.7)
PLATELETS: 207 10*3/uL (ref 150–400)
RBC: 5.78 MIL/uL (ref 4.22–5.81)
RDW: 14.1 % (ref 11.5–15.5)
WBC: 9.7 10*3/uL (ref 4.0–10.5)

## 2015-10-31 LAB — BASIC METABOLIC PANEL
Anion gap: 7 (ref 5–15)
BUN: 17 mg/dL (ref 6–20)
CALCIUM: 8.9 mg/dL (ref 8.9–10.3)
CHLORIDE: 104 mmol/L (ref 101–111)
CO2: 24 mmol/L (ref 22–32)
CREATININE: 1.13 mg/dL (ref 0.61–1.24)
Glucose, Bld: 92 mg/dL (ref 65–99)
Potassium: 3.9 mmol/L (ref 3.5–5.1)
SODIUM: 135 mmol/L (ref 135–145)

## 2015-10-31 LAB — PROTIME-INR
INR: 2
Prothrombin Time: 23 seconds — ABNORMAL HIGH (ref 11.4–15.2)

## 2015-10-31 MED ORDER — ALBUTEROL SULFATE (2.5 MG/3ML) 0.083% IN NEBU
5.0000 mg | INHALATION_SOLUTION | Freq: Once | RESPIRATORY_TRACT | Status: AC
  Administered 2015-10-31: 5 mg via RESPIRATORY_TRACT
  Filled 2015-10-31: qty 6

## 2015-10-31 MED ORDER — AZITHROMYCIN 250 MG PO TABS
500.0000 mg | ORAL_TABLET | Freq: Once | ORAL | Status: AC
  Administered 2015-10-31: 500 mg via ORAL
  Filled 2015-10-31: qty 2

## 2015-10-31 MED ORDER — IPRATROPIUM BROMIDE 0.02 % IN SOLN
0.5000 mg | Freq: Once | RESPIRATORY_TRACT | Status: AC
  Administered 2015-10-31: 0.5 mg via RESPIRATORY_TRACT
  Filled 2015-10-31: qty 2.5

## 2015-10-31 MED ORDER — ALBUTEROL SULFATE HFA 108 (90 BASE) MCG/ACT IN AERS
1.0000 | INHALATION_SPRAY | RESPIRATORY_TRACT | Status: DC | PRN
  Administered 2015-10-31: 2 via RESPIRATORY_TRACT

## 2015-10-31 MED ORDER — AZITHROMYCIN 250 MG PO TABS: 250.0000 mg | ORAL_TABLET | Freq: Every day | ORAL | 0 refills | Status: DC

## 2015-10-31 MED ORDER — PREDNISONE 20 MG PO TABS: 40.0000 mg | ORAL_TABLET | Freq: Every day | ORAL | 0 refills | Status: DC

## 2015-10-31 MED ORDER — PREDNISONE 50 MG PO TABS
60.0000 mg | ORAL_TABLET | Freq: Once | ORAL | Status: AC
  Administered 2015-10-31: 60 mg via ORAL
  Filled 2015-10-31: qty 1

## 2015-10-31 NOTE — ED Provider Notes (Signed)
[facility] Provider Note   CSN: [id number] Arrival date & time: 10/31/15  1159     History   Chief Complaint Chief Complaint  Patient presents with  . Cough    HPI Kevin Hale is a 75 y.o. male.  Felt very weak in the shower last night while coughing causing him to fall onto the shower floor.  Denies LOC or head injury.  No headache today but persistent URI sx.  No other falls and walked into the room today unassisted   The history is provided by the patient.  Cough  This is a new problem. Episode onset: 3 days. The problem occurs constantly. The problem has been gradually worsening. The cough is productive of sputum. There has been no fever. Associated symptoms include chills, rhinorrhea, sore throat, shortness of breath and wheezing. Pertinent negatives include no chest pain. He has tried nothing for the symptoms. The treatment provided no relief. He is a smoker. His past medical history does not include COPD or asthma.    Past Medical History:  Diagnosis Date  . Anxiety   . Atherosclerosis of abdominal aorta ([place]) 11/12/2011  . BPH (benign prostatic hyperplasia)   . Cancer of prostate ([place])   . Diverticulosis of colon   . DJD (degenerative joint disease)    left foot, "pt denies"  . Glucose intolerance (impaired glucose tolerance) 11/03/2010  . History of colon polyps   . History of DVT (deep vein thrombosis)   . HTN (hypertension)   . Hypercholesteremia   . Lumbar degenerative disc disease 11/12/2011  . Plantar fasciitis     Patient Active Problem List   Diagnosis Date Noted  . Acute sinus infection 07/29/2015  . General weakness 07/29/2015  . Influenza with respiratory manifestation other than pneumonia 06/07/2015  . Abdominal pain 05/23/2015  . Right hip pain 05/23/2015  . Trigger finger, acquired 11/17/2014  . Bilateral leg pain 03/16/2014  . Sinusitis, acute 06/03/2013  . Acute bronchitis 06/03/2013  . Encounter for therapeutic drug monitoring  03/25/2013  . Pneumonia involving left lung 07/29/2012  . Lumbar degenerative disc disease 11/12/2011  . Atherosclerosis of abdominal aorta ([place]) 11/12/2011  . Sebaceous cyst 11/12/2011  . Impaired glucose tolerance 11/03/2010  . Encounter for preventative adult health care exam with abnormal findings 11/03/2010  . PULMONARY EMBOLISM 03/09/2010  . ANKLE PAIN, RIGHT 11/28/2009  . ANXIETY 11/01/2009  . LEG CRAMPS 11/01/2009  . CHEST PAIN, RIGHT 01/17/2009  . INTERNAL HEMORRHOIDS WITHOUT MENTION COMP 03/24/2008  . PLANTAR FASCIITIS 03/24/2008  . PROSTATE CANCER, HX OF 03/24/2008  . FATIGUE 11/05/2007  . Dysuria 11/05/2007  . Essential hypertension 10/22/2007  . DIVERTICULOSIS, COLON 10/22/2007  . LEG PAIN, RIGHT 10/21/2007  . COLONIC POLYPS 11/20/2006  . HYPERCHOLESTEROLEMIA 11/20/2006  . DVT 11/20/2006  . LEG EDEMA 11/20/2006  . BENIGN PROSTATIC HYPERTROPHY, HX OF 11/20/2006    Past Surgical History:  Procedure Laterality Date  . INSERTION PROSTATE RADIATION SEED    . UVULECTOMY         Home Medications    Prior to Admission medications   Medication Sig Start Date End Date Taking? Authorizing Provider  allopurinol (ZYLOPRIM) 100 MG tablet TAKE ONE TABLET BY MOUTH ONCE DAILY 09/29/15   Biagio Borg, MD  azithromycin (ZITHROMAX) 250 MG tablet Take 1 tablet (250 mg total) by mouth daily. Take 1 tab po daily till gone 10/31/15 11/04/15  Blanchie Dessert, MD  b complex vitamins tablet Take 1 tablet by mouth daily.  Historical Provider, MD  Cholecalciferol (VITAMIN D3) 1000 UNITS CAPS Take 1 Int'l Units by mouth daily.    Historical Provider, MD  folic acid (FOLVITE) Q000111Q MCG tablet Take 400 mcg by mouth daily.    Historical Provider, MD  lisinopril (PRINIVIL,ZESTRIL) 5 MG tablet TAKE ONE TABLET BY MOUTH ONCE DAILY 01/18/15   Fay Records, MD  predniSONE (DELTASONE) 20 MG tablet Take 2 tablets (40 mg total) by mouth daily. 10/31/15   Blanchie Dessert, MD  Probiotic Product (PROBIOTIC  DAILY PO) Take 1 tablet by mouth daily.    Historical Provider, MD  simvastatin (ZOCOR) 20 MG tablet TAKE ONE TABLET BY MOUTH AT BEDTIME 10/24/15   Biagio Borg, MD  warfarin (COUMADIN) 5 MG tablet 1 tablet everyday except 1.5 tablets on Tuesdays,Thursdays and Sundays or as directed by coumadin clinic 03/08/14   Fay Records, MD    Family History Family History  Problem Relation Age of Onset  . Deep vein thrombosis Mother   . Diabetes Mother   . Healthy Brother   . Healthy Brother     Social History Social History  Substance Use Topics  . Smoking status: Former Research scientist (life sciences)  . Smokeless tobacco: Never Used  . Alcohol use No     Allergies   Pravastatin   Review of Systems Review of Systems  Constitutional: Positive for chills.  HENT: Positive for rhinorrhea and sore throat.   Respiratory: Positive for cough, shortness of breath and wheezing.   Cardiovascular: Negative for chest pain.  All other systems reviewed and are negative.    Physical Exam Updated Vital Signs BP 141/62 (BP Location: Left Arm)   Pulse 87   Temp 98.5 F (36.9 C) (Oral)   Resp 18   Ht 5\' 9"  (1.753 m)   Wt 194 lb (88 kg)   SpO2 95%   BMI 28.65 kg/m   Physical Exam  Constitutional: He is oriented to person, place, and time. He appears well-developed and well-nourished. No distress.  HENT:  Head: Normocephalic and atraumatic.  Right Ear: Tympanic membrane normal.  Left Ear: Tympanic membrane normal.  Mouth/Throat: Oropharynx is clear and moist.  Eyes: Conjunctivae and EOM are normal. Pupils are equal, round, and reactive to light.  Neck: Normal range of motion. Neck supple.  Cardiovascular: Normal rate, regular rhythm and intact distal pulses.   No murmur heard. Pulmonary/Chest: Effort normal. No respiratory distress. He has wheezes. He has no rales.  Diffuse expiratory wheezes and rhonchi  Abdominal: Soft. He exhibits no distension. There is no tenderness. There is no rebound and no guarding.    Musculoskeletal: Normal range of motion. He exhibits no edema or tenderness.  Neurological: He is alert and oriented to person, place, and time.  Skin: Skin is warm and dry. No rash noted. No erythema.  Psychiatric: He has a normal mood and affect. His behavior is normal.  Nursing note and vitals reviewed.    ED Treatments / Results  Labs (all labs ordered are listed, but only abnormal results are displayed) Labs Reviewed  PROTIME-INR - Abnormal; Notable for the following:       Result Value   Prothrombin Time 23.0 (*)    All other components within normal limits  CBC WITH DIFFERENTIAL/PLATELET  BASIC METABOLIC PANEL    EKG  EKG Interpretation None       Radiology Dg Chest 2 View  Result Date: 10/31/2015 CLINICAL DATA:  Chills and cough for 2 days, former smoker, hypertension EXAM: CHEST  2 VIEW COMPARISON:  05/27/2015 FINDINGS: Normal heart size, mediastinal contours, and pulmonary vascularity. Mild chronic bronchitic changes. Minimal scarring LEFT base. Lungs otherwise clear. No pleural effusion or pneumothorax. No acute bony abnormalities. IMPRESSION: Mild chronic bronchitic changes and LEFT basilar scarring without acute infiltrate. Electronically Signed   By: Lavonia Dana M.D.   On: 10/31/2015 12:47    Procedures Procedures (including critical care time)  Medications Ordered in ED Medications  albuterol (PROVENTIL) (2.5 MG/3ML) 0.083% nebulizer solution 5 mg (5 mg Nebulization Given 10/31/15 1305)  ipratropium (ATROVENT) nebulizer solution 0.5 mg (0.5 mg Nebulization Given 10/31/15 1306)  predniSONE (DELTASONE) tablet 60 mg (60 mg Oral Given 10/31/15 1354)  albuterol (PROVENTIL) (2.5 MG/3ML) 0.083% nebulizer solution 5 mg (5 mg Nebulization Given 10/31/15 1408)  ipratropium (ATROVENT) nebulizer solution 0.5 mg (0.5 mg Nebulization Given 10/31/15 1408)  azithromycin (ZITHROMAX) tablet 500 mg (500 mg Oral Given 10/31/15 1440)     Initial Impression / Assessment and Plan / ED  Course  I have reviewed the triage vital signs and the nursing notes.  Pertinent labs & imaging results that were available during my care of the patient were reviewed by me and considered in my medical decision making (see chart for details).  Clinical Course   Pt with symptoms consistent with bronchitis vs pneumonia.  Well appearing here without normal VS and O2 sat of 95% on RA.  No signs of breathing difficulty but diffuse wheezing and pt still admits to smoking occasionally.  No signs of pharyngitis, otitis or abnormal abdominal findings.   CXR with chronic changes but no pneumonia.  After 2 rounds of albuterol/atrovent pt's wheezing much improved.  Pt given azithro and prednisone/albuterol.  Recommended f/u with PCP and pt to return with any further problems.   Final Clinical Impressions(s) / ED Diagnoses   Final diagnoses:  Bronchitis    New Prescriptions New Prescriptions   AZITHROMYCIN (ZITHROMAX) 250 MG TABLET    Take 1 tablet (250 mg total) by mouth daily. Take 1 tab po daily till gone   PREDNISONE (DELTASONE) 20 MG TABLET    Take 2 tablets (40 mg total) by mouth daily.     Blanchie Dessert, MD 10/31/15 [phone]

## 2015-10-31 NOTE — ED Triage Notes (Signed)
C/o prod cough, chills x 2-3 days-weakness and a fall in the shower yesterday-presents to triage in w/c-denies need for cane/walker-states he did ambulate into ED today-NAD

## 2015-10-31 NOTE — ED Notes (Signed)
Patient A&Ox4 and in NAD at discharge. Patient ambulatory with wife.

## 2015-11-01 ENCOUNTER — Other Ambulatory Visit: Payer: Self-pay | Admitting: Internal Medicine

## 2015-11-01 ENCOUNTER — Telehealth: Payer: Self-pay | Admitting: *Deleted

## 2015-11-01 NOTE — Telephone Encounter (Signed)
Pt called & stated he was prescribed Prednisone 20mg  & taking 2 tabs daily for 10 days-started 10/31/15 & taking Z-pak 250mg  started 10/31/15. Pt advised that Prednisone will interact with Coumadin & advised pt to take 1/2 tablet tomorrow-Wednesday 11/02/15 (pt already taken today's dose) and 1/2 tablet on Thursday, 11/03/15.  INR was 2.0 on 10/31/15 in ER. Will see the pt on Friday & advises pt not to take Coumadin dose until after Friday appt.

## 2015-11-03 ENCOUNTER — Ambulatory Visit (INDEPENDENT_AMBULATORY_CARE_PROVIDER_SITE_OTHER): Payer: Medicare Other | Admitting: Internal Medicine

## 2015-11-03 ENCOUNTER — Encounter: Payer: Self-pay | Admitting: Internal Medicine

## 2015-11-03 VITALS — BP 124/72 | HR 75 | Temp 98.0°F | Resp 20 | Wt 197.0 lb

## 2015-11-03 DIAGNOSIS — R05 Cough: Secondary | ICD-10-CM | POA: Diagnosis not present

## 2015-11-03 DIAGNOSIS — I10 Essential (primary) hypertension: Secondary | ICD-10-CM | POA: Diagnosis not present

## 2015-11-03 DIAGNOSIS — R7302 Impaired glucose tolerance (oral): Secondary | ICD-10-CM | POA: Diagnosis not present

## 2015-11-03 DIAGNOSIS — R059 Cough, unspecified: Secondary | ICD-10-CM | POA: Insufficient documentation

## 2015-11-03 DIAGNOSIS — R062 Wheezing: Secondary | ICD-10-CM | POA: Diagnosis not present

## 2015-11-03 MED ORDER — LEVOFLOXACIN 250 MG PO TABS: 250.0000 mg | ORAL_TABLET | Freq: Every day | ORAL | 0 refills | Status: AC

## 2015-11-03 MED ORDER — BENZONATATE 100 MG PO CAPS: ORAL_CAPSULE | ORAL | 1 refills | Status: DC

## 2015-11-03 MED ORDER — METHYLPREDNISOLONE ACETATE 80 MG/ML IJ SUSP
80.0000 mg | Freq: Once | INTRAMUSCULAR | Status: AC
  Administered 2015-11-03: 80 mg via INTRAMUSCULAR

## 2015-11-03 MED ORDER — PREDNISONE 10 MG PO TABS: ORAL_TABLET | ORAL | 0 refills | Status: DC

## 2015-11-03 NOTE — Progress Notes (Signed)
Subjective:    Patient ID: Kevin Hale, male    DOB: February 06, 1941, 75 y.o.   MRN: [id number]  HPI  Here with acute onset mild to mod 2-3 days ST, HA, general weakness and malaise, with prod cough greenish sputum, but Pt denies chest pain, increased sob or doe, wheezing, orthopnea, PND, increased LE swelling, palpitations, dizziness or syncope, except for onset mild sob and wheezing worse again since last pM, despite taking azithromycin and 2 days oral prednisone.  Has been essentially in bed for 3 days. Also has some green/blood tinged slight d/c with blowing nose. Recent INR 2.0, followed per coumadin clinic.   Pt denies polydipsia, polyuria,  Pt denies new neurological symptoms such as new headache, or facial or extremity weakness or numbness Past Medical History:  Diagnosis Date  . Anxiety   . Atherosclerosis of abdominal aorta ([place]) 11/12/2011  . BPH (benign prostatic hyperplasia)   . Cancer of prostate ([place])   . Diverticulosis of colon   . DJD (degenerative joint disease)    left foot, "pt denies"  . Glucose intolerance (impaired glucose tolerance) 11/03/2010  . History of colon polyps   . History of DVT (deep vein thrombosis)   . HTN (hypertension)   . Hypercholesteremia   . Lumbar degenerative disc disease 11/12/2011  . Plantar fasciitis    Past Surgical History:  Procedure Laterality Date  . INSERTION PROSTATE RADIATION SEED    . UVULECTOMY      reports that he has quit smoking. He has never used smokeless tobacco. He reports that he does not drink alcohol or use drugs. family history includes Deep vein thrombosis in his mother; Diabetes in his mother; Healthy in his brother and brother. Allergies  Allergen Reactions  . Pravastatin Other (See Comments)    myalgia   Current Outpatient Prescriptions on File Prior to Visit  Medication Sig Dispense Refill  . allopurinol (ZYLOPRIM) 100 MG tablet TAKE ONE TABLET BY MOUTH ONCE DAILY 90 tablet 2  . b complex vitamins tablet Take 1  tablet by mouth daily.    . Cholecalciferol (VITAMIN D3) 1000 UNITS CAPS Take 1 Int'l Units by mouth daily.    . folic acid (FOLVITE) Q000111Q MCG tablet Take 400 mcg by mouth daily.    Marland Kitchen lisinopril (PRINIVIL,ZESTRIL) 5 MG tablet TAKE ONE TABLET BY MOUTH ONCE DAILY 90 tablet 3  . Probiotic Product (PROBIOTIC DAILY PO) Take 1 tablet by mouth daily.    . simvastatin (ZOCOR) 20 MG tablet TAKE ONE TABLET BY MOUTH AT BEDTIME 90 tablet 0  . warfarin (COUMADIN) 5 MG tablet Take as directed by [facility]madin Clinic 120 tablet 1   No current facility-administered medications on file prior to visit.    Review of Systems  Constitutional: Negative for unusual diaphoresis or night sweats HENT: Negative for ear swelling or discharge Eyes: Negative for worsening visual haziness  Respiratory: Negative for choking and stridor.   Gastrointestinal: Negative for distension or worsening eructation Genitourinary: Negative for retention or change in urine volume.  Musculoskeletal: Negative for other MSK pain or swelling Skin: Negative for color change and worsening wound Neurological: Negative for tremors and numbness other than noted  Psychiatric/Behavioral: Negative for decreased concentration or agitation other than above       Objective:   Physical Exam BP 124/72   Pulse 75   Temp 98 F (36.7 C) (Oral)   Resp 20   Wt 197 lb (89.4 kg)   SpO2 98%   BMI 29.09  kg/m  VS noted, mild ill Constitutional: Pt appears in no apparent distress HENT: Head: NCAT.  Right Ear: External ear normal.  Left Ear: External ear normal.  Bilat tm's with mild erythema.  Max sinus areas non tender.  Pharynx with mild erythema, no exudate Eyes: . Pupils are equal, round, and reactive to light. Conjunctivae and EOM are normal Neck: Normal range of motion. Neck supple.  Cardiovascular: Normal rate and regular rhythm.   Pulmonary/Chest: Effort normal and breath sounds decreased without rales but with few scattered bilat wheezing.    Neurological: Pt is alert. Not confused , motor grossly intact Skin: Skin is warm. No rash, no LE edema Psychiatric: Pt behavior is normal. No agitation.     Assessment & Plan:

## 2015-11-03 NOTE — Progress Notes (Signed)
Pre visit review using our clinic review tool, if applicable. No additional management support is needed unless otherwise documented below in the visit note. 

## 2015-11-03 NOTE — Patient Instructions (Addendum)
OK to stop the azithromycin  Please take all new medication as prescribed - the antibiotic, pills for coughing, and prednisone  Please continue all other medications as before, and refills have been done if requested.  Please have the pharmacy call with any other refills you may need.  Please keep your appointments with your specialists as you may have planned

## 2015-11-04 ENCOUNTER — Ambulatory Visit (INDEPENDENT_AMBULATORY_CARE_PROVIDER_SITE_OTHER): Payer: Medicare Other | Admitting: *Deleted

## 2015-11-04 DIAGNOSIS — I82409 Acute embolism and thrombosis of unspecified deep veins of unspecified lower extremity: Secondary | ICD-10-CM

## 2015-11-04 DIAGNOSIS — I2699 Other pulmonary embolism without acute cor pulmonale: Secondary | ICD-10-CM | POA: Diagnosis not present

## 2015-11-04 DIAGNOSIS — Z5181 Encounter for therapeutic drug level monitoring: Secondary | ICD-10-CM

## 2015-11-04 DIAGNOSIS — Z7901 Long term (current) use of anticoagulants: Secondary | ICD-10-CM

## 2015-11-04 LAB — POCT INR: INR: 1.9

## 2015-11-06 NOTE — Assessment & Plan Note (Signed)
stable overall by history and exam, recent data reviewed with pt, and pt to continue medical treatment as before,  to f/u any worsening symptoms or concerns BP Readings from Last 3 Encounters:  11/03/15 124/72  10/31/15 141/62  09/22/15 118/72

## 2015-11-06 NOTE — Assessment & Plan Note (Signed)
Mild to mod, better then worse again after recent prednisone, for predpac asd, to f/u any worsening symptoms or concerns

## 2015-11-06 NOTE — Assessment & Plan Note (Signed)
stable overall by history and exam, recent data reviewed with pt, and pt to continue medical treatment as before,  to f/u any worsening symptoms or concerns Lab Results  Component Value Date   HGBA1C 5.7 06/28/2015   Pt to call for onset polys or cbg > 200 with steroid tx

## 2015-11-06 NOTE — Assessment & Plan Note (Signed)
Mild to mod, cant r/o bronchitis vs pna, declines cxr, for antibx course asd,  to f/u any worsening symptoms or concerns

## 2015-11-09 ENCOUNTER — Ambulatory Visit (INDEPENDENT_AMBULATORY_CARE_PROVIDER_SITE_OTHER): Payer: Medicare Other | Admitting: Pharmacist

## 2015-11-09 DIAGNOSIS — I82409 Acute embolism and thrombosis of unspecified deep veins of unspecified lower extremity: Secondary | ICD-10-CM

## 2015-11-09 DIAGNOSIS — Z7901 Long term (current) use of anticoagulants: Secondary | ICD-10-CM

## 2015-11-09 DIAGNOSIS — Z5181 Encounter for therapeutic drug level monitoring: Secondary | ICD-10-CM | POA: Diagnosis not present

## 2015-11-09 DIAGNOSIS — I2699 Other pulmonary embolism without acute cor pulmonale: Secondary | ICD-10-CM | POA: Diagnosis not present

## 2015-11-09 LAB — POCT INR: INR: 1.9

## 2015-11-10 ENCOUNTER — Ambulatory Visit: Payer: Medicare Other | Admitting: Internal Medicine

## 2015-11-29 ENCOUNTER — Ambulatory Visit (INDEPENDENT_AMBULATORY_CARE_PROVIDER_SITE_OTHER): Payer: Medicare Other | Admitting: *Deleted

## 2015-11-29 DIAGNOSIS — I82409 Acute embolism and thrombosis of unspecified deep veins of unspecified lower extremity: Secondary | ICD-10-CM | POA: Diagnosis not present

## 2015-11-29 DIAGNOSIS — I2699 Other pulmonary embolism without acute cor pulmonale: Secondary | ICD-10-CM

## 2015-11-29 DIAGNOSIS — Z7901 Long term (current) use of anticoagulants: Secondary | ICD-10-CM | POA: Diagnosis not present

## 2015-11-29 DIAGNOSIS — Z5181 Encounter for therapeutic drug level monitoring: Secondary | ICD-10-CM

## 2015-11-29 LAB — POCT INR: INR: 2.1

## 2015-12-06 ENCOUNTER — Encounter: Payer: Self-pay | Admitting: Family Medicine

## 2015-12-06 ENCOUNTER — Ambulatory Visit (INDEPENDENT_AMBULATORY_CARE_PROVIDER_SITE_OTHER): Payer: Medicare Other | Admitting: Family Medicine

## 2015-12-06 VITALS — BP 115/74 | HR 89 | Temp 97.9°F | Resp 20 | Wt 198.0 lb

## 2015-12-06 DIAGNOSIS — L304 Erythema intertrigo: Secondary | ICD-10-CM | POA: Diagnosis not present

## 2015-12-06 MED ORDER — OXICONAZOLE NITRATE 1 % EX LOTN: TOPICAL_LOTION | Freq: Two times a day (BID) | CUTANEOUS | 0 refills | Status: DC

## 2015-12-06 NOTE — Progress Notes (Signed)
Kevin Hale , 09/19/1940, 75 y.o., male MRN: [id number] Patient Care Team    Relationship Specialty Notifications Start End  Biagio Borg, MD PCP - General   02/08/10     CC: rash Subjective: Pt presents for an acute OV with complaints of rash of 5 days duration. He denies history of similar rash> he reports it has bumps, but no drainage. He endorses soreness, but not pruritic. He denies fever, chills or open wound/drainage. He denies change in products or involvement of rash any other location than right axilla. He is not a diabetic. He was recently on a steroid for URI.     Allergies  Allergen Reactions  . Pravastatin Other (See Comments)    myalgia   Social History  Substance Use Topics  . Smoking status: Former Research scientist (life sciences)  . Smokeless tobacco: Never Used  . Alcohol use No   Past Medical History:  Diagnosis Date  . Anxiety   . Atherosclerosis of abdominal aorta ([place]) 11/12/2011  . BPH (benign prostatic hyperplasia)   . Cancer of prostate ([place])   . Diverticulosis of colon   . DJD (degenerative joint disease)    left foot, "pt denies"  . Glucose intolerance (impaired glucose tolerance) 11/03/2010  . History of colon polyps   . History of DVT (deep vein thrombosis)   . HTN (hypertension)   . Hypercholesteremia   . Lumbar degenerative disc disease 11/12/2011  . Plantar fasciitis    Past Surgical History:  Procedure Laterality Date  . INSERTION PROSTATE RADIATION SEED    . UVULECTOMY     Family History  Problem Relation Age of Onset  . Deep vein thrombosis Mother   . Diabetes Mother   . Healthy Brother   . Healthy Brother      Medication List       Accurate as of 12/06/15  4:47 PM. Always use your most recent med list.          allopurinol 100 MG tablet Commonly known as:  ZYLOPRIM TAKE ONE TABLET BY MOUTH ONCE DAILY   b complex vitamins tablet Take 1 tablet by mouth daily.   folic acid Q000111Q MCG tablet Commonly known as:  FOLVITE Take 400 mcg by mouth  daily.   lisinopril 5 MG tablet Commonly known as:  PRINIVIL,ZESTRIL TAKE ONE TABLET BY MOUTH ONCE DAILY   oxiconazole 1 % lotion Commonly known as:  OXISTAT Apply topically 2 (two) times daily.   PROBIOTIC DAILY PO Take 1 tablet by mouth daily.   simvastatin 20 MG tablet Commonly known as:  ZOCOR TAKE ONE TABLET BY MOUTH AT BEDTIME   Vitamin D3 1000 units Caps Take 1 Int'l Units by mouth daily.   warfarin 5 MG tablet Commonly known as:  COUMADIN Take as directed by Coumadin Clinic       No results found for this or any previous visit (from the past 24 hour(s)). No results found.   ROS: Negative, with the exception of above mentioned in HPI   Objective:  BP 115/74 (BP Location: Left Arm, Patient Position: Sitting, Cuff Size: Large)   Pulse 89   Temp 97.9 F (36.6 C)   Resp 20   Wt 198 lb (89.8 kg)   SpO2 99%   BMI 29.24 kg/m  Body mass index is 29.24 kg/m. Gen: Afebrile. No acute distress. Nontoxic in appearance, well developed, well nourished. Very pleasant AAM.  HENT: AT. [place].  MMM, no oral lesions.  Eyes:Pupils Equal Round Reactive  to light, Extraocular movements intact,  Conjunctiva without redness, discharge or icterus. Neck/lymp/endocrine: Supple, no lymphadenopathy Skin: Mildly raised red ringed irritation right axilla. No purpura, petechiae, skin break, vesicle, abscess or drainage noted. Multiple skin tags present.   Assessment/Plan: Kevin Hale is a 75 y.o. male present for acute OV for  Intertrigo/ erythrasma  - vinegar/water soaks BID, no deodorant until cleared.  - trial of antifungal cream BID for 7 days, discussed if no improvement pt to call in and can try a antibacterial cream. Discussed condition and reoccurrences are possible.  - F/U 1-2 weeks, if no improvements  electronically signed  Howard Pouch, DO  [facility]

## 2015-12-06 NOTE — Patient Instructions (Signed)
Intertrigo Intertrigo is a skin condition that occurs in between folds of skin in places on the body that rub together a lot and do not get much ventilation. It is caused by heat, moisture, friction, sweat retention, and lack of air circulation, which produces red, irritated patches and, sometimes, scaling or drainage. People who have diabetes, who are obese, or who have treatment with antibiotics are at increased risk for intertrigo. The most common sites for intertrigo to occur include:  The groin.  The breasts.  The armpits.  Folds of abdominal skin.  Webbed spaces between the fingers or toes. Intertrigo may be aggravated by:  Sweat.  Feces.  Yeast or bacteria that are present near skin folds.  Urine.  Vaginal discharge. HOME CARE INSTRUCTIONS  The following steps can be taken to reduce friction and keep the affected area cool and dry:  Expose skin folds to the air.  Keep deep skin folds separated with cotton or linen cloth. Avoid tight fitting clothing that could cause chafing.  Wear open-toed shoes or sandals to help reduce moisture between the toes.  Apply absorbent powders to affected areas as directed by your caregiver.  Apply over-the-counter barrier pastes, such as zinc oxide, as directed by your caregiver.  If you develop a fungal infection in the affected area, your caregiver may have you use antifungal creams. SEEK MEDICAL CARE IF:   The rash is not improving after 1 week of treatment.  The rash is getting worse (more red, more swollen, more painful, or spreading).  You have a fever or chills. MAKE SURE YOU:   Understand these instructions.  Will watch your condition.  Will get help right away if you are not doing well or get worse.   This information is not intended to replace advice given to you by your health care provider. Make sure you discuss any questions you have with your health care provider.   Document Released: 02/12/2005 Document  Revised: 05/07/2011 Document Reviewed: 08/16/2014 Elsevier Interactive Patient Education [employer].   I have called in a cream for you to start for 1 week, if no improvement then call in and I will call in a different cream.  No deodorant until cleared up.   This is usually caused by either fungal or bacteria. It can return even after treated. Try water/vinegar soaks as well a few times a day.

## 2015-12-13 ENCOUNTER — Ambulatory Visit (INDEPENDENT_AMBULATORY_CARE_PROVIDER_SITE_OTHER): Payer: Medicare Other | Admitting: *Deleted

## 2015-12-13 DIAGNOSIS — Z5181 Encounter for therapeutic drug level monitoring: Secondary | ICD-10-CM

## 2015-12-13 DIAGNOSIS — Z7901 Long term (current) use of anticoagulants: Secondary | ICD-10-CM | POA: Diagnosis not present

## 2015-12-13 DIAGNOSIS — I82409 Acute embolism and thrombosis of unspecified deep veins of unspecified lower extremity: Secondary | ICD-10-CM | POA: Diagnosis not present

## 2015-12-13 DIAGNOSIS — I2699 Other pulmonary embolism without acute cor pulmonale: Secondary | ICD-10-CM

## 2015-12-13 LAB — POCT INR: INR: 2.4

## 2015-12-22 ENCOUNTER — Encounter: Payer: Self-pay | Admitting: Internal Medicine

## 2015-12-22 ENCOUNTER — Ambulatory Visit (INDEPENDENT_AMBULATORY_CARE_PROVIDER_SITE_OTHER): Payer: Medicare Other | Admitting: Internal Medicine

## 2015-12-22 VITALS — BP 140/82 | HR 90 | Temp 98.0°F | Resp 20 | Wt 202.2 lb

## 2015-12-22 DIAGNOSIS — L84 Corns and callosities: Secondary | ICD-10-CM | POA: Diagnosis not present

## 2015-12-22 DIAGNOSIS — R21 Rash and other nonspecific skin eruption: Secondary | ICD-10-CM | POA: Insufficient documentation

## 2015-12-22 DIAGNOSIS — Z23 Encounter for immunization: Secondary | ICD-10-CM

## 2015-12-22 DIAGNOSIS — L309 Dermatitis, unspecified: Secondary | ICD-10-CM | POA: Insufficient documentation

## 2015-12-22 MED ORDER — CLOTRIMAZOLE-BETAMETHASONE 1-0.05 % EX CREA: TOPICAL_CREAM | CUTANEOUS | 1 refills | Status: DC

## 2015-12-22 NOTE — Patient Instructions (Signed)
Please take all new medication as prescribed - the lotrisone which should not be expensive  Please continue all other medications as before, and refills have been done if requested.  Please have the pharmacy call with any other refills you may need.  Please keep your appointments with your specialists as you may have planned

## 2015-12-22 NOTE — Progress Notes (Signed)
Pre visit review using our clinic review tool, if applicable. No additional management support is needed unless otherwise documented below in the visit note. 

## 2015-12-22 NOTE — Progress Notes (Signed)
Subjective:    Patient ID: Kevin Hale, male    DOB: 1940-06-30, 75 y.o.   MRN: [id number]  HPI  Here with several skin concerns; has cont'd erythema/swelling unusual to right axilla for several months without worsening pain, fever, drainage or ulceration or masses noted;  Was seen recently per primary care but could not afford $300 antifungal cream prescription at last visit, so has been trying what sounds like lotrimin otc recommended by pharmacist, but not working well.  Overall mild to mod, nothing else makes better or worse Wt Readings from Last 3 Encounters:  12/22/15 202 lb 4 oz (91.7 kg)  12/06/15 198 lb (89.8 kg)  11/03/15 197 lb (89.4 kg)   BP Readings from Last 3 Encounters:  12/22/15 140/82  12/06/15 115/74  11/03/15 124/72  Also with bilat circumferential scaly off white rash to bilat feet for many months, mod persistent, not really worse, but not getting better, nothing seems to make better or worse.  Does also have several onychomycotic nails and has been to podiatry for nail care in the past few yrs.  Also with callous/hardness to the left medial heel, chronic persistent for several yrs, nothing makes worse, and has been self treating with pumice stone like tx to keep it scaled down Past Medical History:  Diagnosis Date  . Anxiety   . Atherosclerosis of abdominal aorta ([place]) 11/12/2011  . BPH (benign prostatic hyperplasia)   . Cancer of prostate ([place])   . Diverticulosis of colon   . DJD (degenerative joint disease)    left foot, "pt denies"  . Glucose intolerance (impaired glucose tolerance) 11/03/2010  . History of colon polyps   . History of DVT (deep vein thrombosis)   . HTN (hypertension)   . Hypercholesteremia   . Lumbar degenerative disc disease 11/12/2011  . Plantar fasciitis    Past Surgical History:  Procedure Laterality Date  . INSERTION PROSTATE RADIATION SEED    . UVULECTOMY      reports that he has quit smoking. He has never used smokeless tobacco. He  reports that he does not drink alcohol or use drugs. family history includes Deep vein thrombosis in his mother; Diabetes in his mother; Healthy in his brother and brother. Allergies  Allergen Reactions  . Pravastatin Other (See Comments)    myalgia   Current Outpatient Prescriptions on File Prior to Visit  Medication Sig Dispense Refill  . allopurinol (ZYLOPRIM) 100 MG tablet TAKE ONE TABLET BY MOUTH ONCE DAILY 90 tablet 2  . b complex vitamins tablet Take 1 tablet by mouth daily.    . Cholecalciferol (VITAMIN D3) 1000 UNITS CAPS Take 1 Int'l Units by mouth daily.    . folic acid (FOLVITE) Q000111Q MCG tablet Take 400 mcg by mouth daily.    Marland Kitchen lisinopril (PRINIVIL,ZESTRIL) 5 MG tablet TAKE ONE TABLET BY MOUTH ONCE DAILY 90 tablet 3  . oxiconazole (OXISTAT) 1 % lotion Apply topically 2 (two) times daily. 30 mL 0  . Probiotic Product (PROBIOTIC DAILY PO) Take 1 tablet by mouth daily.    . simvastatin (ZOCOR) 20 MG tablet TAKE ONE TABLET BY MOUTH AT BEDTIME 90 tablet 0  . warfarin (COUMADIN) 5 MG tablet Take as directed by Coumadin Clinic 120 tablet 1   No current facility-administered medications on file prior to visit.    Review of Systems  Constitutional: Negative for unusual diaphoresis or night sweats HENT: Negative for ear swelling or discharge Eyes: Negative for worsening visual haziness  Respiratory:  Negative for choking and stridor.   Gastrointestinal: Negative for distension or worsening eructation Genitourinary: Negative for retention or change in urine volume.  Musculoskeletal: Negative for other MSK pain or swelling Skin: Negative for color change and worsening wound Neurological: Negative for tremors and numbness other than noted  Psychiatric/Behavioral: Negative for decreased concentration or agitation other than above   All other system neg per pt    Objective:   Physical Exam BP 140/82   Pulse 90   Temp 98 F (36.7 C) (Oral)   Resp 20   Wt 202 lb 4 oz (91.7 kg)    SpO2 98%   BMI 29.87 kg/m  VS noted,  Constitutional: Pt appears in no apparent distress HENT: Head: NCAT.  Right Ear: External ear normal.  Left Ear: External ear normal.  Eyes: . Pupils are equal, round, and reactive to light. Conjunctivae and EOM are normal Neck: Normal range of motion. Neck supple.  Cardiovascular: Normal rate and regular rhythm.   Pulmonary/Chest: Effort normal and breath sounds without rales or wheezing.  Neurological: Pt is alert. Not confused , motor grossly intact Skin: Skin is warm. no LE edema, right axilla with 4x4 cm minimal tender but slight swollen superficial skin erythema with discrete edges; bilat feet with scaly nontender rash circumferential about both feet, multiple onychomycotic nails noted. Left heel with 1 cm area nontender nonulcerated callous without erythema or swelling  Psychiatric: Pt behavior is normal. No agitation.     Assessment & Plan:

## 2015-12-25 NOTE — Assessment & Plan Note (Signed)
Noted, pt has been successful with self tx, and can cont tx as is,  to f/u any worsening symptoms or concerns

## 2015-12-25 NOTE — Assessment & Plan Note (Signed)
I agree most likely c/w fungal, for lotrisone asd,  to f/u any worsening symptoms or concerns

## 2015-12-25 NOTE — Assessment & Plan Note (Signed)
Again most likely c/w fungal, for lotrisone asd, . to f/u any worsening symptoms or concerns

## 2015-12-28 DIAGNOSIS — H40003 Preglaucoma, unspecified, bilateral: Secondary | ICD-10-CM | POA: Diagnosis not present

## 2016-01-10 ENCOUNTER — Ambulatory Visit (INDEPENDENT_AMBULATORY_CARE_PROVIDER_SITE_OTHER): Payer: Medicare Other | Admitting: *Deleted

## 2016-01-10 DIAGNOSIS — I2699 Other pulmonary embolism without acute cor pulmonale: Secondary | ICD-10-CM

## 2016-01-10 DIAGNOSIS — I82409 Acute embolism and thrombosis of unspecified deep veins of unspecified lower extremity: Secondary | ICD-10-CM | POA: Diagnosis not present

## 2016-01-10 DIAGNOSIS — Z7901 Long term (current) use of anticoagulants: Secondary | ICD-10-CM

## 2016-01-10 DIAGNOSIS — Z5181 Encounter for therapeutic drug level monitoring: Secondary | ICD-10-CM

## 2016-01-10 LAB — POCT INR: INR: 2

## 2016-01-15 NOTE — Progress Notes (Signed)
Cardiology Office Note   Date:  01/16/2016   ID:  NIKODEM SCHWANKE, DOB 07-16-1940, MRN [id number]  PCP:  Cathlean Cower, MD  Cardiologist:   Dorris Carnes, MD   Pt presents for f/u of DVT    History of Present Illness: Kevin Hale is a 75 y.o. male with a history of chronic LE DVT and PE  Echo in 2012 showed normal LVEF and RVEF   I saw him in Nov 2016   SInce I saw him he denies CP  Breathing is stable       Outpatient Medications Prior to Visit  Medication Sig Dispense Refill  . allopurinol (ZYLOPRIM) 100 MG tablet TAKE ONE TABLET BY MOUTH ONCE DAILY 90 tablet 2  . b complex vitamins tablet Take 1 tablet by mouth daily.    . Cholecalciferol (VITAMIN D3) 1000 UNITS CAPS Take 1 Int'l Units by mouth daily.    Marland Kitchen lisinopril (PRINIVIL,ZESTRIL) 5 MG tablet TAKE ONE TABLET BY MOUTH ONCE DAILY 90 tablet 3  . oxiconazole (OXISTAT) 1 % lotion Apply topically 2 (two) times daily. 30 mL 0  . Probiotic Product (PROBIOTIC DAILY PO) Take 1 tablet by mouth daily.    . simvastatin (ZOCOR) 20 MG tablet TAKE ONE TABLET BY MOUTH AT BEDTIME 90 tablet 0  . warfarin (COUMADIN) 5 MG tablet Take as directed by Coumadin Clinic 120 tablet 1  . clotrimazole-betamethasone (LOTRISONE) cream Use as directed twice per day as needed (Patient not taking: Reported on [id number]) 45 g 1  . folic acid (FOLVITE) Q000111Q MCG tablet Take 400 mcg by mouth daily.     No facility-administered medications prior to visit.      Allergies:   Pravastatin   Past Medical History:  Diagnosis Date  . Anxiety   . Atherosclerosis of abdominal aorta ([place]) 11/12/2011  . BPH (benign prostatic hyperplasia)   . Cancer of prostate ([place])   . Diverticulosis of colon   . DJD (degenerative joint disease)    left foot, "pt denies"  . Glucose intolerance (impaired glucose tolerance) 11/03/2010  . History of colon polyps   . History of DVT (deep vein thrombosis)   . HTN (hypertension)   . Hypercholesteremia   . Lumbar degenerative disc  disease 11/12/2011  . Plantar fasciitis     Past Surgical History:  Procedure Laterality Date  . INSERTION PROSTATE RADIATION SEED    . UVULECTOMY       Social History:  The patient  reports that he has quit smoking. He has never used smokeless tobacco. He reports that he does not drink alcohol or use drugs.   Family History:  The patient's family history includes Deep vein thrombosis in his mother; Diabetes in his mother; Healthy in his brother and brother.    ROS:  Please see the history of present illness. All other systems are reviewed and  Negative to the above problem except as noted.    PHYSICAL EXAM: VS:  BP 124/60   Pulse 81   Ht 5\' 9"  (1.753 m)   Wt 197 lb 9.6 oz (89.6 kg)   SpO2 96%   BMI 29.18 kg/m   GEN: Well nourished, well developed, in no acute distress  HEENT: normal  Neck: no JVD, carotid bruits, or masses Cardiac: RRR; no murmurs, rubs, or gallops,no edema  Respiratory:  clear to auscultation bilaterally, normal work of breathing GI: soft, nontender, nondistended, + BS  No hepatomegaly  MS: no deformity Moving all extremities  Skin: warm and dry, no rash Neuro:  Strength and sensation are intact Psych: euthymic mood, full affect   EKG:  EKG is not  ordered today.   Lipid Panel    Component Value Date/Time   CHOL 108 06/28/2015 1030   TRIG 90.0 06/28/2015 1030   HDL 46.80 06/28/2015 1030   CHOLHDL 2 06/28/2015 1030   VLDL 18.0 06/28/2015 1030   LDLCALC 43 06/28/2015 1030      Wt Readings from Last 3 Encounters:  01/16/16 197 lb 9.6 oz (89.6 kg)  12/22/15 202 lb 4 oz (91.7 kg)  12/06/15 198 lb (89.8 kg)      ASSESSMENT AND PLAN: 1/  Hx of DVT/PE  Keep on coumadin    2  PVOD  Atherosclerosis of aorta  NO symptoms to sugg symptomatic angina   3  HL  Keep on statin   LDL in May was 107    F?U in 1 year   Stay active     Current medicines are reviewed at length with the patient today.  The patient does not have concerns regarding  medicines.  Signed, Dorris Carnes, MD  01/16/2016 10:48 AM    [address], [place], [place]  [address] Phone: [phone]; Fax: [phone]

## 2016-01-16 ENCOUNTER — Encounter: Payer: Self-pay | Admitting: Internal Medicine

## 2016-01-16 ENCOUNTER — Ambulatory Visit (INDEPENDENT_AMBULATORY_CARE_PROVIDER_SITE_OTHER): Payer: Medicare Other | Admitting: Internal Medicine

## 2016-01-16 VITALS — BP 124/60 | HR 81 | Ht 69.0 in | Wt 197.6 lb

## 2016-01-16 DIAGNOSIS — I700 Atherosclerosis of aorta: Secondary | ICD-10-CM

## 2016-01-16 DIAGNOSIS — I825Y9 Chronic embolism and thrombosis of unspecified deep veins of unspecified proximal lower extremity: Secondary | ICD-10-CM | POA: Diagnosis not present

## 2016-01-16 DIAGNOSIS — E785 Hyperlipidemia, unspecified: Secondary | ICD-10-CM

## 2016-01-16 NOTE — Patient Instructions (Signed)
Your physician recommends that you continue on your current medications as directed. Please refer to the Current Medication list given to you today. Your physician wants you to follow-up in: 1 year with Dr. Ross.  You will receive a reminder letter in the mail two months in advance. If you don't receive a letter, please call our office to schedule the follow-up appointment.  

## 2016-01-18 ENCOUNTER — Other Ambulatory Visit: Payer: Self-pay | Admitting: Internal Medicine

## 2016-01-24 ENCOUNTER — Other Ambulatory Visit: Payer: Self-pay | Admitting: Internal Medicine

## 2016-02-06 DIAGNOSIS — R3915 Urgency of urination: Secondary | ICD-10-CM | POA: Diagnosis not present

## 2016-02-07 ENCOUNTER — Ambulatory Visit (INDEPENDENT_AMBULATORY_CARE_PROVIDER_SITE_OTHER): Payer: Medicare Other | Admitting: Pharmacist

## 2016-02-07 DIAGNOSIS — Z7901 Long term (current) use of anticoagulants: Secondary | ICD-10-CM | POA: Diagnosis not present

## 2016-02-07 DIAGNOSIS — Z5181 Encounter for therapeutic drug level monitoring: Secondary | ICD-10-CM

## 2016-02-07 DIAGNOSIS — I2699 Other pulmonary embolism without acute cor pulmonale: Secondary | ICD-10-CM

## 2016-02-07 DIAGNOSIS — I82409 Acute embolism and thrombosis of unspecified deep veins of unspecified lower extremity: Secondary | ICD-10-CM | POA: Diagnosis not present

## 2016-02-07 LAB — POCT INR: INR: 2.1

## 2016-03-20 ENCOUNTER — Ambulatory Visit (INDEPENDENT_AMBULATORY_CARE_PROVIDER_SITE_OTHER): Payer: Medicare Other | Admitting: *Deleted

## 2016-03-20 DIAGNOSIS — I2699 Other pulmonary embolism without acute cor pulmonale: Secondary | ICD-10-CM | POA: Diagnosis not present

## 2016-03-20 DIAGNOSIS — I82409 Acute embolism and thrombosis of unspecified deep veins of unspecified lower extremity: Secondary | ICD-10-CM | POA: Diagnosis not present

## 2016-03-20 DIAGNOSIS — Z7901 Long term (current) use of anticoagulants: Secondary | ICD-10-CM

## 2016-03-20 DIAGNOSIS — Z5181 Encounter for therapeutic drug level monitoring: Secondary | ICD-10-CM | POA: Diagnosis not present

## 2016-03-20 LAB — POCT INR: INR: 2.2

## 2016-04-13 ENCOUNTER — Telehealth: Payer: Self-pay | Admitting: Internal Medicine

## 2016-04-13 NOTE — Telephone Encounter (Signed)
Error

## 2016-04-13 NOTE — Telephone Encounter (Signed)
Pt called to say he was started on amoxicillin. Advised him this does not interact wit his warfarin. No further questions.

## 2016-04-23 ENCOUNTER — Other Ambulatory Visit: Payer: Self-pay | Admitting: Internal Medicine

## 2016-04-30 ENCOUNTER — Other Ambulatory Visit: Payer: Self-pay | Admitting: Internal Medicine

## 2016-05-01 ENCOUNTER — Ambulatory Visit (INDEPENDENT_AMBULATORY_CARE_PROVIDER_SITE_OTHER): Payer: Medicare Other | Admitting: *Deleted

## 2016-05-01 DIAGNOSIS — Z7901 Long term (current) use of anticoagulants: Secondary | ICD-10-CM

## 2016-05-01 DIAGNOSIS — I2699 Other pulmonary embolism without acute cor pulmonale: Secondary | ICD-10-CM

## 2016-05-01 DIAGNOSIS — I82409 Acute embolism and thrombosis of unspecified deep veins of unspecified lower extremity: Secondary | ICD-10-CM

## 2016-05-01 DIAGNOSIS — Z5181 Encounter for therapeutic drug level monitoring: Secondary | ICD-10-CM | POA: Diagnosis not present

## 2016-05-01 LAB — POCT INR: INR: 2.2

## 2016-06-12 ENCOUNTER — Ambulatory Visit (INDEPENDENT_AMBULATORY_CARE_PROVIDER_SITE_OTHER): Payer: Medicare Other | Admitting: *Deleted

## 2016-06-12 DIAGNOSIS — I2699 Other pulmonary embolism without acute cor pulmonale: Secondary | ICD-10-CM | POA: Diagnosis not present

## 2016-06-12 DIAGNOSIS — Z5181 Encounter for therapeutic drug level monitoring: Secondary | ICD-10-CM

## 2016-06-12 DIAGNOSIS — I82409 Acute embolism and thrombosis of unspecified deep veins of unspecified lower extremity: Secondary | ICD-10-CM

## 2016-06-12 DIAGNOSIS — Z7901 Long term (current) use of anticoagulants: Secondary | ICD-10-CM | POA: Diagnosis not present

## 2016-06-12 LAB — POCT INR: INR: 2.3

## 2016-06-25 ENCOUNTER — Other Ambulatory Visit: Payer: Self-pay | Admitting: Internal Medicine

## 2016-07-12 ENCOUNTER — Ambulatory Visit (INDEPENDENT_AMBULATORY_CARE_PROVIDER_SITE_OTHER): Payer: Medicare Other | Admitting: Internal Medicine

## 2016-07-12 ENCOUNTER — Encounter: Payer: Self-pay | Admitting: Internal Medicine

## 2016-07-12 VITALS — BP 138/82 | HR 85 | Ht 69.0 in | Wt 193.0 lb

## 2016-07-12 DIAGNOSIS — I10 Essential (primary) hypertension: Secondary | ICD-10-CM | POA: Diagnosis not present

## 2016-07-12 DIAGNOSIS — M5441 Lumbago with sciatica, right side: Secondary | ICD-10-CM | POA: Diagnosis not present

## 2016-07-12 DIAGNOSIS — R7302 Impaired glucose tolerance (oral): Secondary | ICD-10-CM | POA: Diagnosis not present

## 2016-07-12 MED ORDER — PREDNISONE 10 MG PO TABS: ORAL_TABLET | ORAL | 0 refills | Status: DC

## 2016-07-12 MED ORDER — TRAMADOL HCL 50 MG PO TABS: 50.0000 mg | ORAL_TABLET | Freq: Three times a day (TID) | ORAL | 1 refills | Status: DC | PRN

## 2016-07-12 MED ORDER — GABAPENTIN 100 MG PO CAPS: 100.0000 mg | ORAL_CAPSULE | Freq: Three times a day (TID) | ORAL | 3 refills | Status: DC

## 2016-07-12 NOTE — Progress Notes (Signed)
Subjective:    Patient ID: Kevin Hale, male    DOB: [birth year], 76 y.o.   MRN: [id number]  HPI  Here with 1 mo -  new for him - onset mild to mod LBP just not improving or getting worse; constant, worse to standing up with some radiation to the right > left upper legs.  Pain is not worse to walk or stand for longer periods of time. Nothing else makes better or worse.  No prior imaging or surgical evaluations   Pt denies fever, wt loss, night sweats, loss of appetite, or other constitutional symptoms  Pt denies bowel or bladder change, fever, wt loss,  worsening LE numbness/weakness, gait change or falls.    Past Medical History:  Diagnosis Date  . Anxiety   . Atherosclerosis of abdominal aorta ([place]) 11/12/2011  . BPH (benign prostatic hyperplasia)   . Cancer of prostate ([place])   . Diverticulosis of colon   . DJD (degenerative joint disease)    left foot, "pt denies"  . Glucose intolerance (impaired glucose tolerance) 11/03/2010  . History of colon polyps   . History of DVT (deep vein thrombosis)   . HTN (hypertension)   . Hypercholesteremia   . Lumbar degenerative disc disease 11/12/2011  . Plantar fasciitis    Past Surgical History:  Procedure Laterality Date  . INSERTION PROSTATE RADIATION SEED    . UVULECTOMY      reports that he has quit smoking. He has never used smokeless tobacco. He reports that he does not drink alcohol or use drugs. family history includes Deep vein thrombosis in his mother; Diabetes in his mother; Healthy in his brother and brother. Allergies  Allergen Reactions  . Pravastatin Other (See Comments)    myalgia   Current Outpatient Prescriptions on File Prior to Visit  Medication Sig Dispense Refill  . allopurinol (ZYLOPRIM) 100 MG tablet Take 1 tablet (100 mg total) by mouth daily. Yearly physical due in June must see MD for refills 90 tablet 0  . b complex vitamins tablet Take 1 tablet by mouth daily.    . Cholecalciferol (VITAMIN D3) 1000 UNITS CAPS  Take 1 Int'l Units by mouth daily.    Marland Kitchen lisinopril (PRINIVIL,ZESTRIL) 5 MG tablet TAKE ONE TABLET BY MOUTH ONCE DAILY. 90 tablet 3  . Probiotic Product (PROBIOTIC DAILY PO) Take 1 tablet by mouth daily.    . simvastatin (ZOCOR) 20 MG tablet Take 1 tablet (20 mg total) by mouth at bedtime. Yearly physical w/labs due in May must see MD for refills 90 tablet 0  . warfarin (COUMADIN) 5 MG tablet TAKE AS DIRECTED  BY COUMADIN CLINIC. 120 tablet 1   No current facility-administered medications on file prior to visit.    Review of Systems  Constitutional: Negative for other unusual diaphoresis or sweats HENT: Negative for ear discharge or swelling Eyes: Negative for other worsening visual disturbances Respiratory: Negative for stridor or other swelling  Gastrointestinal: Negative for worsening distension or other blood Genitourinary: Negative for retention or other urinary change Musculoskeletal: Negative for other MSK pain or swelling Skin: Negative for color change or other new lesions Neurological: Negative for worsening tremors and other numbness  Psychiatric/Behavioral: Negative for worsening agitation or other fatigue All other system neg per pt    Objective:   Physical Exam BP 138/82   Pulse 85   Ht 5\' 9"  (1.753 m)   Wt 193 lb (87.5 kg)   SpO2 99%   BMI 28.50 kg/m  VS noted, not ill appearing Constitutional: Pt appears in NAD HENT: Head: NCAT.  Right Ear: External ear normal.  Left Ear: External ear normal.  Eyes: . Pupils are equal, round, and reactive to light. Conjunctivae and EOM are normal Nose: without d/c or deformity Neck: Neck supple. Gross normal ROM Cardiovascular: Normal rate and regular rhythm.   Pulmonary/Chest: Effort normal and breath sounds without rales or wheezing.  Abd:  Soft, NT, ND, + BS, no organomegaly Spine: nontender in midline and no significant paravertebral tender Neurological: Pt is alert. At baseline orientation, motor 5/5intact, cn 2-12  intact Skin: Skin is warm. No rashes, other new lesions, no LE edema Psychiatric: Pt behavior is normal without agitation  No other exam findings    Assessment & Plan:

## 2016-07-12 NOTE — Patient Instructions (Signed)
Please take all new medication as prescribed - the pain medication, prednisone, and the gabapentin for nerve pain as well  Please continue all other medications as before, and refills have been done if requested.  Please have the pharmacy call with any other refills you may need.  Please keep your appointments with your specialists as you may have planned  Please go to the XRAY Department in the Basement (go straight as you get off the elevator) for the x-ray testing tomorrow  You will be contacted by phone if any changes need to be made immediately.  Otherwise, you will receive a letter about your results with an explanation, but please check with MyChart first.  Please remember to sign up for MyChart if you have not done so, as this will be important to you in the future with finding out test results, communicating by private email, and scheduling acute appointments online when needed.  Please return in 1 - 2 wks if not improved

## 2016-07-13 ENCOUNTER — Ambulatory Visit (INDEPENDENT_AMBULATORY_CARE_PROVIDER_SITE_OTHER)
Admission: RE | Admit: 2016-07-13 | Discharge: 2016-07-13 | Disposition: A | Discharge status: Home / Self Care | Payer: Medicare Other | Source: Ambulatory Visit | Attending: Internal Medicine | Admitting: Internal Medicine

## 2016-07-13 DIAGNOSIS — M5441 Lumbago with sciatica, right side: Secondary | ICD-10-CM

## 2016-07-13 DIAGNOSIS — M545 Low back pain: Secondary | ICD-10-CM | POA: Diagnosis not present

## 2016-07-15 DIAGNOSIS — M5441 Lumbago with sciatica, right side: Secondary | ICD-10-CM | POA: Insufficient documentation

## 2016-07-15 NOTE — Assessment & Plan Note (Signed)
Differential includes newly symptomatic underlying DJD or DDD with sciatica vs facet syndrome; has had no prior imaging so for LSspine films, and tx with pain control, predpac asd, and trial gabapentin asd; consider MRI or surgical evaluation if not improved

## 2016-07-15 NOTE — Assessment & Plan Note (Signed)
stable overall by history and exam, recent data reviewed with pt, and pt to continue medical treatment as before,  to f/u any worsening symptoms or concerns Lab Results  Component Value Date   HGBA1C 5.7 06/28/2015  pt to call for onset polys or cbg > 200 with steroid tx

## 2016-07-15 NOTE — Assessment & Plan Note (Signed)
stable overall by history and exam, recent data reviewed with pt, and pt to continue medical treatment as before,  to f/u any worsening symptoms or concerns BP Readings from Last 3 Encounters:  07/12/16 138/82  01/16/16 124/60  12/22/15 140/82

## 2016-07-24 ENCOUNTER — Ambulatory Visit (INDEPENDENT_AMBULATORY_CARE_PROVIDER_SITE_OTHER): Payer: Medicare Other | Admitting: *Deleted

## 2016-07-24 DIAGNOSIS — Z7901 Long term (current) use of anticoagulants: Secondary | ICD-10-CM

## 2016-07-24 DIAGNOSIS — I82409 Acute embolism and thrombosis of unspecified deep veins of unspecified lower extremity: Secondary | ICD-10-CM | POA: Diagnosis not present

## 2016-07-24 DIAGNOSIS — Z5181 Encounter for therapeutic drug level monitoring: Secondary | ICD-10-CM

## 2016-07-24 DIAGNOSIS — I2699 Other pulmonary embolism without acute cor pulmonale: Secondary | ICD-10-CM

## 2016-07-24 LAB — POCT INR: INR: 2

## 2016-07-26 ENCOUNTER — Other Ambulatory Visit: Payer: Self-pay | Admitting: Internal Medicine

## 2016-07-30 ENCOUNTER — Telehealth: Payer: Self-pay | Admitting: Internal Medicine

## 2016-07-30 NOTE — Telephone Encounter (Signed)
New message      Pt is on coumadin.  He has a presc for amoxicillian 500.  Is it ok to take with coumadin?  Will he need to have his coumadin dosage adjusted?  Also can he drink milk while taking amoxicillian? Please call

## 2016-07-30 NOTE — Telephone Encounter (Signed)
Returned call to pt to advise him that amoxicillin is fine with warfarin and that he can drink milk with his antibiotic. No further questions.

## 2016-08-07 ENCOUNTER — Encounter: Payer: Self-pay | Admitting: Internal Medicine

## 2016-08-20 ENCOUNTER — Ambulatory Visit: Payer: Medicare Other | Admitting: Physician Assistant

## 2016-08-27 ENCOUNTER — Ambulatory Visit (INDEPENDENT_AMBULATORY_CARE_PROVIDER_SITE_OTHER): Payer: Medicare Other | Admitting: Internal Medicine

## 2016-08-27 ENCOUNTER — Encounter: Payer: Self-pay | Admitting: Internal Medicine

## 2016-08-27 VITALS — BP 142/70 | HR 79 | Temp 98.4°F | Resp 16 | Wt 197.0 lb

## 2016-08-27 DIAGNOSIS — J209 Acute bronchitis, unspecified: Secondary | ICD-10-CM | POA: Diagnosis not present

## 2016-08-27 MED ORDER — CEFDINIR 300 MG PO CAPS: 300.0000 mg | ORAL_CAPSULE | Freq: Two times a day (BID) | ORAL | 0 refills | Status: DC

## 2016-08-27 NOTE — Progress Notes (Signed)
Subjective:    Patient ID: Kevin Hale, male    DOB: 1940/06/30, 76 y.o.   MRN: [id number]  HPI He is here for an acute visit for cold symptoms.  His symptoms started 4 days ago.   He is experiencing weakness, productive cough with green sputum, left ear feels clogged, mild SOB and wheeze.   He denies fever/chills, chest pain, headaches.  He denies sinus pain, sore throat and ear pain.    He has tried taking robitussin.  It is helping.    He has to fly next week and is concerned about flying - he does not feel he should go and would like a letter to excuse him from his flight.   Medications and allergies reviewed with patient and updated if appropriate.  Patient Active Problem List   Diagnosis Date Noted  . Low back pain with right-sided sciatica 07/15/2016  . Localized rash 12/22/2015  . Foot dermatitis 12/22/2015  . Pre-ulcerative corn or callous 12/22/2015  . Cough 11/03/2015  . Acute sinus infection 07/29/2015  . General weakness 07/29/2015  . Influenza with respiratory manifestation other than pneumonia 06/07/2015  . Abdominal pain 05/23/2015  . Right hip pain 05/23/2015  . Trigger finger, acquired 11/17/2014  . Bilateral leg pain 03/16/2014  . Sinusitis, acute 06/03/2013  . Acute bronchitis 06/03/2013  . Encounter for therapeutic drug monitoring 03/25/2013  . Pneumonia involving left lung 07/29/2012  . Lumbar degenerative disc disease 11/12/2011  . Atherosclerosis of abdominal aorta ([place]) 11/12/2011  . Sebaceous cyst 11/12/2011  . Wheezing 01/29/2011  . Impaired glucose tolerance 11/03/2010  . Encounter for preventative adult health care exam with abnormal findings 11/03/2010  . PULMONARY EMBOLISM 03/09/2010  . ANKLE PAIN, RIGHT 11/28/2009  . ANXIETY 11/01/2009  . LEG CRAMPS 11/01/2009  . CHEST PAIN, RIGHT 01/17/2009  . INTERNAL HEMORRHOIDS WITHOUT MENTION COMP 03/24/2008  . PLANTAR FASCIITIS 03/24/2008  . PROSTATE CANCER, HX OF 03/24/2008  . FATIGUE  11/05/2007  . Dysuria 11/05/2007  . Essential hypertension 10/22/2007  . DIVERTICULOSIS, COLON 10/22/2007  . LEG PAIN, RIGHT 10/21/2007  . COLONIC POLYPS 11/20/2006  . HYPERCHOLESTEROLEMIA 11/20/2006  . DVT 11/20/2006  . LEG EDEMA 11/20/2006  . BENIGN PROSTATIC HYPERTROPHY, HX OF 11/20/2006    Current Outpatient Prescriptions on File Prior to Visit  Medication Sig Dispense Refill  . allopurinol (ZYLOPRIM) 100 MG tablet Take 1 tablet (100 mg total) by mouth daily. Yearly physical due in June must see MD for refills 90 tablet 0  . b complex vitamins tablet Take 1 tablet by mouth daily.    . Cholecalciferol (VITAMIN D3) 1000 UNITS CAPS Take 1 Int'l Units by mouth daily.    Marland Kitchen lisinopril (PRINIVIL,ZESTRIL) 5 MG tablet TAKE ONE TABLET BY MOUTH ONCE DAILY. 90 tablet 3  . Probiotic Product (PROBIOTIC DAILY PO) Take 1 tablet by mouth daily.    . simvastatin (ZOCOR) 20 MG tablet TAKE 1 TABLET BY MOUTH AT BEDTIME. YEARLY PHYSICAL WITH LABS DUE IN MAY MUST SEE MD FOR REFILLS 90 tablet 0  . warfarin (COUMADIN) 5 MG tablet TAKE AS DIRECTED  BY COUMADIN CLINIC. 120 tablet 1   No current facility-administered medications on file prior to visit.     Past Medical History:  Diagnosis Date  . Anxiety   . Atherosclerosis of abdominal aorta ([place]) 11/12/2011  . BPH (benign prostatic hyperplasia)   . Cancer of prostate ([place])   . Diverticulosis of colon   . DJD (degenerative joint disease)  left foot, "pt denies"  . Glucose intolerance (impaired glucose tolerance) 11/03/2010  . History of colon polyps   . History of DVT (deep vein thrombosis)   . HTN (hypertension)   . Hypercholesteremia   . Lumbar degenerative disc disease 11/12/2011  . Plantar fasciitis     Past Surgical History:  Procedure Laterality Date  . INSERTION PROSTATE RADIATION SEED    . UVULECTOMY      Social History   Social History  . Marital status: Married    Spouse name: N/A  . Number of children: 2  . Years of  education: N/A   Occupational History  . retired    Social History Main Topics  . Smoking status: Former Research scientist (life sciences)  . Smokeless tobacco: Never Used  . Alcohol use No  . Drug use: No  . Sexual activity: Not Currently   Other Topics Concern  . None   Social History Narrative  . None    Family History  Problem Relation Age of Onset  . Deep vein thrombosis Mother   . Diabetes Mother   . Healthy Brother   . Healthy Brother     Review of Systems  Constitutional: Negative for appetite change, chills and fever.  HENT: Positive for congestion. Negative for ear pain (left ear clogged), sinus pain, sinus pressure and sore throat.   Respiratory: Positive for cough (green sputum), shortness of breath and wheezing.   Cardiovascular: Negative for chest pain.  Gastrointestinal: Negative for diarrhea and nausea.  Neurological: Negative for light-headedness and headaches.       Objective:   Vitals:   08/27/16 1032  BP: (!) 142/70  Pulse: 79  Resp: 16  Temp: 98.4 F (36.9 C)   Filed Weights   08/27/16 1032  Weight: 197 lb (89.4 kg)   Body mass index is 29.09 kg/m.  Wt Readings from Last 3 Encounters:  08/27/16 197 lb (89.4 kg)  07/12/16 193 lb (87.5 kg)  01/16/16 197 lb 9.6 oz (89.6 kg)     Physical Exam GENERAL APPEARANCE: Appears stated age, well appearing, NAD EYES: conjunctiva clear, no icterus HEENT: bilateral tympanic membranes and ear canals normal, oropharynx with mild erythema, no thyromegaly, trachea midline, no cervical or supraclavicular lymphadenopathy LUNGS: Clear to auscultation without wheeze or crackles, unlabored breathing, good air entry bilaterally HEART: Normal S1,S2 without murmurs EXTREMITIES: Without clubbing, cyanosis, or edema        Assessment & Plan:   See Problem List for Assessment and Plan of chronic medical problems.

## 2016-08-27 NOTE — Patient Instructions (Signed)

## 2016-08-27 NOTE — Assessment & Plan Note (Signed)
Mild in nature Will start omnicef No steroids needed - lungs are clear Continue robitussin Call or return if symptoms are not improving

## 2016-09-04 ENCOUNTER — Ambulatory Visit (INDEPENDENT_AMBULATORY_CARE_PROVIDER_SITE_OTHER): Payer: Medicare Other

## 2016-09-04 DIAGNOSIS — Z7901 Long term (current) use of anticoagulants: Secondary | ICD-10-CM

## 2016-09-04 DIAGNOSIS — I2699 Other pulmonary embolism without acute cor pulmonale: Secondary | ICD-10-CM | POA: Diagnosis not present

## 2016-09-04 DIAGNOSIS — Z5181 Encounter for therapeutic drug level monitoring: Secondary | ICD-10-CM | POA: Diagnosis not present

## 2016-09-04 DIAGNOSIS — I82409 Acute embolism and thrombosis of unspecified deep veins of unspecified lower extremity: Secondary | ICD-10-CM | POA: Diagnosis not present

## 2016-09-04 LAB — POCT INR: INR: 2

## 2016-09-12 ENCOUNTER — Encounter: Payer: Self-pay | Admitting: Internal Medicine

## 2016-09-12 ENCOUNTER — Ambulatory Visit (INDEPENDENT_AMBULATORY_CARE_PROVIDER_SITE_OTHER): Payer: Medicare Other | Admitting: Internal Medicine

## 2016-09-12 ENCOUNTER — Other Ambulatory Visit (INDEPENDENT_AMBULATORY_CARE_PROVIDER_SITE_OTHER): Payer: Medicare Other

## 2016-09-12 VITALS — BP 136/84 | HR 70 | Ht 69.0 in | Wt 198.0 lb

## 2016-09-12 DIAGNOSIS — Z0000 Encounter for general adult medical examination without abnormal findings: Secondary | ICD-10-CM | POA: Diagnosis not present

## 2016-09-12 DIAGNOSIS — Z7901 Long term (current) use of anticoagulants: Secondary | ICD-10-CM | POA: Insufficient documentation

## 2016-09-12 DIAGNOSIS — Z0001 Encounter for general adult medical examination with abnormal findings: Secondary | ICD-10-CM

## 2016-09-12 DIAGNOSIS — R011 Cardiac murmur, unspecified: Secondary | ICD-10-CM | POA: Insufficient documentation

## 2016-09-12 HISTORY — DX: Long term (current) use of anticoagulants: Z79.01

## 2016-09-12 LAB — CBC WITH DIFFERENTIAL/PLATELET
Basophils Absolute: 0 10*3/uL (ref 0.0–0.1)
Basophils Relative: 0.7 % (ref 0.0–3.0)
EOS ABS: 0.1 10*3/uL (ref 0.0–0.7)
EOS PCT: 2.1 % (ref 0.0–5.0)
HCT: 50.6 % (ref 39.0–52.0)
Hemoglobin: 16.3 g/dL (ref 13.0–17.0)
LYMPHS ABS: 2.6 10*3/uL (ref 0.7–4.0)
Lymphocytes Relative: 44.7 % (ref 12.0–46.0)
MCHC: 32.2 g/dL (ref 30.0–36.0)
MCV: 90.2 fl (ref 78.0–100.0)
Monocytes Absolute: 0.5 10*3/uL (ref 0.1–1.0)
Monocytes Relative: 8.9 % (ref 3.0–12.0)
Neutro Abs: 2.6 10*3/uL (ref 1.4–7.7)
Neutrophils Relative %: 43.6 % (ref 43.0–77.0)
PLATELETS: 244 10*3/uL (ref 150.0–400.0)
RBC: 5.62 Mil/uL (ref 4.22–5.81)
RDW: 13.3 % (ref 11.5–15.5)
WBC: 5.9 10*3/uL (ref 4.0–10.5)

## 2016-09-12 LAB — HEPATIC FUNCTION PANEL
ALBUMIN: 4.3 g/dL (ref 3.5–5.2)
ALK PHOS: 71 U/L (ref 39–117)
ALT: 16 U/L (ref 0–53)
AST: 19 U/L (ref 0–37)
Bilirubin, Direct: 0.2 mg/dL (ref 0.0–0.3)
Total Bilirubin: 0.7 mg/dL (ref 0.2–1.2)
Total Protein: 7.1 g/dL (ref 6.0–8.3)

## 2016-09-12 LAB — URINALYSIS, ROUTINE W REFLEX MICROSCOPIC
BILIRUBIN URINE: NEGATIVE
HGB URINE DIPSTICK: NEGATIVE
KETONES UR: NEGATIVE
Leukocytes, UA: NEGATIVE
NITRITE: NEGATIVE
PH: 7 (ref 5.0–8.0)
Specific Gravity, Urine: 1.015 (ref 1.000–1.030)
Total Protein, Urine: NEGATIVE
UROBILINOGEN UA: 1 (ref 0.0–1.0)
Urine Glucose: NEGATIVE

## 2016-09-12 LAB — BASIC METABOLIC PANEL
BUN: 11 mg/dL (ref 6–23)
CHLORIDE: 104 meq/L (ref 96–112)
CO2: 28 meq/L (ref 19–32)
Calcium: 9.6 mg/dL (ref 8.4–10.5)
Creatinine, Ser: 1.19 mg/dL (ref 0.40–1.50)
GFR: 76.27 mL/min (ref >60.00)
GLUCOSE: 132 mg/dL — AB (ref 70–99)
POTASSIUM: 4.7 meq/L (ref 3.5–5.1)
SODIUM: 138 meq/L (ref 135–145)

## 2016-09-12 LAB — LIPID PANEL
CHOLESTEROL: 111 mg/dL (ref 0–200)
HDL: 52.2 mg/dL (ref >39.00)
LDL CALC: 48 mg/dL (ref 0–99)
NonHDL: 59.06
TRIGLYCERIDES: 57 mg/dL (ref 0.0–149.0)
Total CHOL/HDL Ratio: 2
VLDL: 11.4 mg/dL (ref 0.0–40.0)

## 2016-09-12 LAB — PSA: PSA: 0.01 ng/mL — AB (ref 0.10–4.00)

## 2016-09-12 LAB — TSH: TSH: 0.84 u[IU]/mL (ref 0.35–4.50)

## 2016-09-12 NOTE — Patient Instructions (Addendum)
You can take Mucinex (or it's generic off brand) for congestion, and tylenol as needed for pain.  Please continue all other medications as before, and refills have been done if requested.  Please have the pharmacy call with any other refills you may need.  Please continue your efforts at being more active, low cholesterol diet, and weight control.  You are otherwise up to date with prevention measures today.  Please keep your appointments with your specialists as you may have planned  You will be contacted regarding the referral for: Echocardiogram (ultrasound of the heart)  Please go to the LAB in the Basement (turn left off the elevator) for the tests to be done today  You will be contacted by phone if any changes need to be made immediately.  Otherwise, you will receive a letter about your results with an explanation, but please check with MyChart first.  Please remember to sign up for MyChart if you have not done so, as this will be important to you in the future with finding out test results, communicating by private email, and scheduling acute appointments online when needed.  Please return in 1 year for your yearly visit, or sooner if needed, with Lab testing done 3-5 days before

## 2016-09-12 NOTE — Progress Notes (Signed)
Subjective:    Patient ID: Kevin MATHEY, male    DOB: 1940-04-01, 76 y.o.   MRN: [id number]  HPI  Here for wellness and f/u;  Overall doing ok;  Pt denies Chest pain, worsening SOB, DOE, orthopnea, PND, worsening LE edema, palpitations, dizziness or syncope.  Pt denies neurological change such as new headache, facial or extremity weakness.  Pt denies polydipsia, polyuria, or low sugar symptoms. Pt states overall good compliance with treatment and medications, good tolerability, and has been trying to follow appropriate diet.  Pt denies worsening depressive symptoms, suicidal ideation or panic. No fever, night sweats, wt loss, loss of appetite, or other constitutional symptoms.  Pt states good ability with ADL's, has low fall risk, home safety reviewed and adequate, no other significant changes in hearing or vision, and not generally active with exercise.  Scheduled for fu colonscopy later this yr.  Was seen per Dr Quay Burow recently with antibx for bronchitis/?pna,  Overall better but still with small residual cough scant productive light gray color, with occasional wheeziness to exhale in the mid upper chest. Past Medical History:  Diagnosis Date  . Anxiety   . Atherosclerosis of abdominal aorta ([place]) 11/12/2011  . BPH (benign prostatic hyperplasia)   . Cancer of prostate ([place])   . Diverticulosis of colon   . DJD (degenerative joint disease)    left foot, "pt denies"  . Glucose intolerance (impaired glucose tolerance) 11/03/2010  . History of colon polyps   . History of DVT (deep vein thrombosis)   . HTN (hypertension)   . Hypercholesteremia   . Lumbar degenerative disc disease 11/12/2011  . Plantar fasciitis    Past Surgical History:  Procedure Laterality Date  . INSERTION PROSTATE RADIATION SEED    . UVULECTOMY      reports that he has quit smoking. He has never used smokeless tobacco. He reports that he does not drink alcohol or use drugs. family history includes Deep vein thrombosis in  his mother; Diabetes in his mother; Healthy in his brother and brother. Allergies  Allergen Reactions  . Pravastatin Other (See Comments)    myalgia   Current Outpatient Prescriptions on File Prior to Visit  Medication Sig Dispense Refill  . allopurinol (ZYLOPRIM) 100 MG tablet Take 1 tablet (100 mg total) by mouth daily. Yearly physical due in June must see MD for refills 90 tablet 0  . b complex vitamins tablet Take 1 tablet by mouth daily.    . Cholecalciferol (VITAMIN D3) 1000 UNITS CAPS Take 1 Int'l Units by mouth daily.    Marland Kitchen lisinopril (PRINIVIL,ZESTRIL) 5 MG tablet TAKE ONE TABLET BY MOUTH ONCE DAILY. 90 tablet 3  . Probiotic Product (PROBIOTIC DAILY PO) Take 1 tablet by mouth daily.    . simvastatin (ZOCOR) 20 MG tablet TAKE 1 TABLET BY MOUTH AT BEDTIME. YEARLY PHYSICAL WITH LABS DUE IN MAY MUST SEE MD FOR REFILLS 90 tablet 0  . warfarin (COUMADIN) 5 MG tablet TAKE AS DIRECTED  BY COUMADIN CLINIC. 120 tablet 1   No current facility-administered medications on file prior to visit.    Review of Systems Constitutional: Negative for other unusual diaphoresis, sweats, appetite or weight changes HENT: Negative for other worsening hearing loss, ear pain, facial swelling, mouth sores or neck stiffness.   Eyes: Negative for other worsening pain, redness or other visual disturbance.  Respiratory: Negative for other stridor or swelling Cardiovascular: Negative for other palpitations or other chest pain  Gastrointestinal: Negative for worsening diarrhea or  loose stools, blood in stool, distention or other pain Genitourinary: Negative for hematuria, flank pain or other change in urine volume.  Musculoskeletal: Negative for myalgias or other joint swelling.  Skin: Negative for other color change, or other wound or worsening drainage.  Neurological: Negative for other syncope or numbness. Hematological: Negative for other adenopathy or swelling Psychiatric/Behavioral: Negative for  hallucinations, other worsening agitation, SI, self-injury, or new decreased concentration All other system neg per pt    Objective:   Physical Exam BP 136/84   Pulse 70   Ht 5\' 9"  (1.753 m)   Wt 198 lb (89.8 kg)   SpO2 99%   BMI 29.24 kg/m  VS noted,  Constitutional: Pt is oriented to person, place, and time. Appears well-developed and well-nourished, in no significant distress and comfortable Head: Normocephalic and atraumatic  Eyes: Conjunctivae and EOM are normal. Pupils are equal, round, and reactive to light Right Ear: External ear normal without discharge Left Ear: External ear normal without discharge Nose: Nose without discharge or deformity Mouth/Throat: Oropharynx is without other ulcerations and moist  Neck: Normal range of motion. Neck supple. No JVD present. No tracheal deviation present or significant neck LA or mass Cardiovascular: Normal rate, regular rhythm, normal heart sounds and intact distal pulses.  except for seemingly new gr 2/6 murmur RUSB Pulmonary/Chest: WOB normal and breath sounds without rales or wheezing  Abdominal: Soft. Bowel sounds are normal. NT. No HSM  Musculoskeletal: Normal range of motion. Exhibits no edema Lymphadenopathy: Has no other cervical adenopathy.  Neurological: Pt is alert and oriented to person, place, and time. Pt has normal reflexes. No cranial nerve deficit. Motor grossly intact, Gait intact Skin: Skin is warm and dry. No rash noted or new ulcerations Psychiatric:  Has normal mood and affect. Behavior is normal without agitation No other exam findings Lab Results  Component Value Date   WBC 9.7 10/31/2015   HGB 16.6 10/31/2015   HCT 49.3 10/31/2015   PLT 207 10/31/2015   GLUCOSE 92 10/31/2015   CHOL 108 06/28/2015   TRIG 90.0 06/28/2015   HDL 46.80 06/28/2015   LDLCALC 43 06/28/2015   ALT 17 06/28/2015   AST 20 06/28/2015   NA 135 10/31/2015   K 3.9 10/31/2015   CL 104 10/31/2015   CREATININE 1.13 10/31/2015   BUN  17 10/31/2015   CO2 24 10/31/2015   TSH 0.95 06/28/2015   PSA 0.00 (L) 12/28/2014   INR 2.0 09/04/2016   HGBA1C 5.7 06/28/2015      Assessment & Plan:

## 2016-09-13 ENCOUNTER — Other Ambulatory Visit (INDEPENDENT_AMBULATORY_CARE_PROVIDER_SITE_OTHER): Payer: Medicare Other

## 2016-09-13 DIAGNOSIS — R739 Hyperglycemia, unspecified: Secondary | ICD-10-CM | POA: Diagnosis not present

## 2016-09-13 LAB — HEMOGLOBIN A1C: Hgb A1c MFr Bld: 5.3 % (ref 4.6–6.5)

## 2016-09-15 NOTE — Assessment & Plan Note (Signed)

## 2016-09-15 NOTE — Assessment & Plan Note (Signed)
Cant r/o AS - for echo

## 2016-09-18 ENCOUNTER — Telehealth (HOSPITAL_COMMUNITY): Payer: Self-pay | Admitting: Internal Medicine

## 2016-09-19 NOTE — Telephone Encounter (Signed)
User: Cherie Dark A Date/time: 09/19/16 9:16 AM  Comment: Called pt and lmsg for him to CB to get scheduled for echo.   Context:  Outcome: Left Message  Phone number: [phone] Phone Type: Home Phone  Comm. type: Telephone Call type: Outgoing  Contact: [place] Relation to patient: Self    User: Cherie Dark A Date/time: 09/18/16 2:33 PM  Comment: Called pt and lmsg for him to CB to get scheduled for echo.   Context:  Outcome: Left Message  Phone number: [phone] Phone Type: Home Phone  Comm. type: Telephone Call type: Outgoing  Contact: Delanna Notice L Relation to patient: Self

## 2016-09-24 ENCOUNTER — Other Ambulatory Visit: Payer: Self-pay | Admitting: Internal Medicine

## 2016-09-25 ENCOUNTER — Encounter: Payer: Self-pay | Admitting: Internal Medicine

## 2016-09-25 ENCOUNTER — Ambulatory Visit (INDEPENDENT_AMBULATORY_CARE_PROVIDER_SITE_OTHER): Payer: Medicare Other | Admitting: Internal Medicine

## 2016-09-25 VITALS — BP 122/74 | HR 96 | Ht 69.0 in | Wt 197.5 lb

## 2016-09-25 DIAGNOSIS — Z86010 Personal history of colonic polyps: Secondary | ICD-10-CM | POA: Diagnosis not present

## 2016-09-25 DIAGNOSIS — Z7901 Long term (current) use of anticoagulants: Secondary | ICD-10-CM

## 2016-09-25 MED ORDER — NA SULFATE-K SULFATE-MG SULF 17.5-3.13-1.6 GM/177ML PO SOLN: 1.0000 | Freq: Once | ORAL | 0 refills | Status: AC

## 2016-09-25 NOTE — Progress Notes (Signed)
HISTORY OF PRESENT ILLNESS:  Kevin Hale is a 76 y.o. 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 Patient presents today with chief complaint of needing surveillance colonoscopy. He has a history of multiple adenomatous colon polyps and is undergone multiple colonoscopies in [place] and here [place]. Most recent colonoscopy performed 10/05/2011. Normal exam except for moderate diverticulosis and internal hemorrhoids. Because of a history of multiple adenomas follow-up in 5 years recommended. Patient denies any significant interval medical history or surgical history. GI review of systems is entirely negative. He continues his primary care with Dr. Jenny Reichmann. Review of outside blood work from 09/12/2016 finds a unremarkable comprehensive metabolic panel, lipid panel, and CBC with differential.  REVIEW OF SYSTEMS:  All non-GI ROS negative except for ankle swelling  Past Medical History:  Diagnosis Date  . Anxiety   . Atherosclerosis of abdominal aorta ([place]) 11/12/2011  . BPH (benign prostatic hyperplasia)   . Cancer of prostate ([place])   . Chronic anticoagulation 09/12/2016  . Diverticulosis of colon   . DJD (degenerative joint disease)    left foot, "pt denies"  . Glucose intolerance (impaired glucose tolerance) 11/03/2010  . History of colon polyps   . History of DVT (deep vein thrombosis)   . HTN (hypertension)   . Hypercholesteremia   . Lumbar degenerative disc disease 11/12/2011  . Plantar fasciitis     Past Surgical History:  Procedure Laterality Date  . INSERTION PROSTATE RADIATION SEED    . UVULECTOMY      Social History RAMIZ TURPIN  reports that he has quit smoking. He has never used smokeless tobacco. He reports that he does not drink alcohol or use drugs.  family history includes Deep vein thrombosis in his mother; Diabetes in his mother; Healthy in his brother and brother.  Allergies   Allergen Reactions  . Pravastatin Other (See Comments)    myalgia       PHYSICAL EXAMINATION: Vital signs: BP 122/74   Pulse 96   Ht 5\' 9"  (1.753 m)   Wt 197 lb 8 oz (89.6 kg)   BMI 29.17 kg/m   Constitutional: generally well-appearing, no acute distress Psychiatric: alert and oriented x3, cooperative Eyes: extraocular movements intact, anicteric, conjunctiva pink Mouth: oral pharynx moist, no lesions Neck: supple no lymphadenopathy Cardiovascular: heart regular rate and rhythm, no murmur Lungs: clear to auscultation bilaterally Abdomen: soft, nontender, nondistended, no obvious ascites, no peritoneal signs, normal bowel sounds, no organomegaly Rectal:Deferred until colonoscopy Extremities: no no clubbing or cyanosis. Trace lower extremity edema bilaterally Skin: no lesions on visible extremities Neuro: No focal deficits. Cranial nerves intact  ASSESSMENT:  #1. History of multiple adenomatous colon polyps. Due for surveillance #2. History of DVT with pulmonary embolus on chronic Coumadin therapy #3. Multiple medical problems. Stable and the care of Dr. Jenny Reichmann   PLAN:  #1. Surveillance colonoscopy. The patient is high-risk given his need for chronic anticoagulation. We discussed the pros and cons of disrupting anticoagulation, heparin bridge, or proceeding with his procedure on Coumadin. He prefers the latter as was done at time of his last surveillance exam. This is reasonable.The nature of the procedure, as well as the risks, benefits, and alternatives were carefully and thoroughly reviewed with the patient. Ample time for discussion and questions allowed. The patient understood, was satisfied, and agreed to proceed.

## 2016-09-25 NOTE — Patient Instructions (Signed)

## 2016-09-26 ENCOUNTER — Ambulatory Visit (HOSPITAL_COMMUNITY): Payer: Medicare Other | Attending: Cardiology

## 2016-09-26 ENCOUNTER — Other Ambulatory Visit: Payer: Self-pay

## 2016-09-26 DIAGNOSIS — R011 Cardiac murmur, unspecified: Secondary | ICD-10-CM | POA: Diagnosis not present

## 2016-09-26 DIAGNOSIS — I081 Rheumatic disorders of both mitral and tricuspid valves: Secondary | ICD-10-CM | POA: Insufficient documentation

## 2016-09-26 DIAGNOSIS — I119 Hypertensive heart disease without heart failure: Secondary | ICD-10-CM | POA: Insufficient documentation

## 2016-09-26 DIAGNOSIS — E785 Hyperlipidemia, unspecified: Secondary | ICD-10-CM | POA: Insufficient documentation

## 2016-10-16 ENCOUNTER — Ambulatory Visit (INDEPENDENT_AMBULATORY_CARE_PROVIDER_SITE_OTHER): Payer: Medicare Other | Admitting: *Deleted

## 2016-10-16 DIAGNOSIS — I2699 Other pulmonary embolism without acute cor pulmonale: Secondary | ICD-10-CM | POA: Diagnosis not present

## 2016-10-16 DIAGNOSIS — Z5181 Encounter for therapeutic drug level monitoring: Secondary | ICD-10-CM | POA: Diagnosis not present

## 2016-10-16 DIAGNOSIS — Z7901 Long term (current) use of anticoagulants: Secondary | ICD-10-CM

## 2016-10-16 DIAGNOSIS — I82409 Acute embolism and thrombosis of unspecified deep veins of unspecified lower extremity: Secondary | ICD-10-CM

## 2016-10-16 LAB — POCT INR: INR: 2.6

## 2016-10-17 ENCOUNTER — Ambulatory Visit (INDEPENDENT_AMBULATORY_CARE_PROVIDER_SITE_OTHER): Payer: Medicare Other | Admitting: Internal Medicine

## 2016-10-17 ENCOUNTER — Encounter: Payer: Self-pay | Admitting: Internal Medicine

## 2016-10-17 ENCOUNTER — Ambulatory Visit (INDEPENDENT_AMBULATORY_CARE_PROVIDER_SITE_OTHER)
Admission: RE | Admit: 2016-10-17 | Discharge: 2016-10-17 | Disposition: A | Discharge status: Home / Self Care | Payer: Medicare Other | Source: Ambulatory Visit | Attending: Internal Medicine | Admitting: Internal Medicine

## 2016-10-17 VITALS — BP 132/88 | HR 75 | Temp 98.6°F | Ht 69.0 in | Wt 201.0 lb

## 2016-10-17 DIAGNOSIS — R062 Wheezing: Secondary | ICD-10-CM

## 2016-10-17 DIAGNOSIS — Z23 Encounter for immunization: Secondary | ICD-10-CM | POA: Diagnosis not present

## 2016-10-17 DIAGNOSIS — J309 Allergic rhinitis, unspecified: Secondary | ICD-10-CM | POA: Insufficient documentation

## 2016-10-17 DIAGNOSIS — R05 Cough: Secondary | ICD-10-CM

## 2016-10-17 DIAGNOSIS — R059 Cough, unspecified: Secondary | ICD-10-CM

## 2016-10-17 MED ORDER — PREDNISONE 10 MG PO TABS: ORAL_TABLET | ORAL | 0 refills | Status: DC

## 2016-10-17 MED ORDER — TRIAMCINOLONE ACETONIDE 55 MCG/ACT NA AERO: 2.0000 | INHALATION_SPRAY | Freq: Every day | NASAL | 12 refills | Status: DC

## 2016-10-17 MED ORDER — METHYLPREDNISOLONE ACETATE 80 MG/ML IJ SUSP
80.0000 mg | Freq: Once | INTRAMUSCULAR | Status: AC
  Administered 2016-10-17: 80 mg via INTRAMUSCULAR

## 2016-10-17 MED ORDER — CETIRIZINE HCL 10 MG PO TABS: 10.0000 mg | ORAL_TABLET | Freq: Every day | ORAL | 11 refills | Status: DC

## 2016-10-17 NOTE — Assessment & Plan Note (Signed)
Mild to mod, for depomedrol IM 80, predpac asd, zyrtec and nasacort for longer term control,  to f/u any worsening symptoms or concerns

## 2016-10-17 NOTE — Patient Instructions (Addendum)
You had the flu shot today  You had the steroid shot today  Please take all new medication as prescribed - the prednisone, as well as the zyrtec and nasacort for better post nasal drip control  You can also take Mucinex (or it's generic off brand) for congestion, and tylenol as needed for pain.  Please continue all other medications as before, and refills have been done if requested.  Please have the pharmacy call with any other refills you may need.  Please keep your appointments with your specialists as you may have planned  Please go to the XRAY Department in the Basement (go straight as you get off the elevator) for the x-ray testing  You will be contacted by phone if any changes need to be made immediately.  Otherwise, you will receive a letter about your results with an explanation, but please check with MyChart first.  Please remember to sign up for MyChart if you have not done so, as this will be important to you in the future with finding out test results, communicating by private email, and scheduling acute appointments online when needed.

## 2016-10-17 NOTE — Assessment & Plan Note (Signed)
Seems possible post nasal gtt related, but cant r/o chronic bronchitis as well, no signs of infection, for cxr, also for mucinex otc prn

## 2016-10-17 NOTE — Assessment & Plan Note (Signed)
Very mild at end expiration only at the neck, likely related to mild pharyngeal/bronchial congestion, doubt volume overload, for cxr as above, also for mucinex otc prn

## 2016-10-17 NOTE — Progress Notes (Signed)
Subjective:    Patient ID: Kevin Hale, male    DOB: 04/20/1940, 76 y.o.   MRN: [id number]  HPI  Here with c/o 3 mo onset recurrring scant prod cough with clear/yellow mucous phelgm with expiratory wheeze in the neck, but Pt denies chest pain, increased sob or doe, orthopnea, PND, increased LE swelling, palpitations, dizziness or syncope.  No pain or fever and not feeling ill.  Has hx of chronic Bronchitis by cxr sept 2017. Also Does have several wks ongoing but very mild nasal allergy symptoms with clearish congestion, itch and sneezing.  Pt denies new neurological symptoms such as new headache, or facial or extremity weakness or numbness   Pt denies polydipsia, polyuria  Due for flu shot  No hoarseness and  Pt denies fever, wt loss, night sweats, loss of appetite, or other constitutional symptoms Wt Readings from Last 3 Encounters:  10/17/16 201 lb (91.2 kg)  09/25/16 197 lb 8 oz (89.6 kg)  09/12/16 198 lb (89.8 kg)   Past Medical History:  Diagnosis Date  . Anxiety   . Atherosclerosis of abdominal aorta ([place]) 11/12/2011  . BPH (benign prostatic hyperplasia)   . Cancer of prostate ([place])   . Chronic anticoagulation 09/12/2016  . Diverticulosis of colon   . DJD (degenerative joint disease)    left foot, "pt denies"  . Glucose intolerance (impaired glucose tolerance) 11/03/2010  . History of colon polyps   . History of DVT (deep vein thrombosis)   . HTN (hypertension)   . Hypercholesteremia   . Lumbar degenerative disc disease 11/12/2011  . Plantar fasciitis    Past Surgical History:  Procedure Laterality Date  . INSERTION PROSTATE RADIATION SEED    . UVULECTOMY      reports that he has quit smoking. He has never used smokeless tobacco. He reports that he does not drink alcohol or use drugs. family history includes Deep vein thrombosis in his mother; Diabetes in his mother; Healthy in his brother and brother. Allergies  Allergen Reactions  . Pravastatin Other (See Comments)   myalgia   Current Outpatient Prescriptions on File Prior to Visit  Medication Sig Dispense Refill  . allopurinol (ZYLOPRIM) 100 MG tablet TAKE 1 TABLET BY MOUTH ONCE DAILY 90 tablet 1  . b complex vitamins tablet Take 1 tablet by mouth daily.    . Cholecalciferol (VITAMIN D3) 1000 UNITS CAPS Take 1 Int'l Units by mouth daily.    Marland Kitchen lisinopril (PRINIVIL,ZESTRIL) 5 MG tablet TAKE ONE TABLET BY MOUTH ONCE DAILY. 90 tablet 3  . Probiotic Product (PROBIOTIC DAILY PO) Take 1 tablet by mouth daily.    . simvastatin (ZOCOR) 20 MG tablet TAKE 1 TABLET BY MOUTH AT BEDTIME. YEARLY PHYSICAL WITH LABS DUE IN MAY MUST SEE MD FOR REFILLS 90 tablet 0  . warfarin (COUMADIN) 5 MG tablet TAKE AS DIRECTED  BY COUMADIN CLINIC. 120 tablet 1   No current facility-administered medications on file prior to visit.    Review of Systems  Constitutional: Negative for other unusual diaphoresis or sweats HENT: Negative for ear discharge or swelling Eyes: Negative for other worsening visual disturbances Respiratory: Negative for stridor or other swelling  Gastrointestinal: Negative for worsening distension or other blood Genitourinary: Negative for retention or other urinary change Musculoskeletal: Negative for other MSK pain or swelling Skin: Negative for color change or other new lesions Neurological: Negative for worsening tremors and other numbness  Psychiatric/Behavioral: Negative for worsening agitation or other fatigue All other system neg per  pt    Objective:   Physical Exam BP 132/88   Pulse 75   Temp 98.6 F (37 C)   Ht 5\' 9"  (1.753 m)   Wt 201 lb (91.2 kg)   SpO2 100%   BMI 29.68 kg/m  VS noted, not ill appearing Constitutional: Pt appears in NAD HENT: Head: NCAT.  Right Ear: External ear normal.  Left Ear: External ear normal.  Bilat tm's with no erythema.  Max sinus areasnon tender.  Pharynx with mild erythema, no exudate but + cobblestoning Eyes: . Pupils are equal, round, and reactive to  light. Conjunctivae and EOM are normal Nose: without d/c or deformity Neck: Neck supple. Gross normal ROM, no tender LA other mass Cardiovascular: Normal rate and regular rhythm.   Pulmonary/Chest: Effort normal and breath sounds without rales or wheezing.  Neurological: Pt is alert. At baseline orientation, motor grossly intact Skin: Skin is warm. No rashes, other new lesions, no LE edema Psychiatric: Pt behavior is normal without agitation  No other exam findings Lab Results  Component Value Date   WBC 5.9 09/12/2016   HGB 16.3 09/12/2016   HCT 50.6 09/12/2016   PLT 244.0 09/12/2016   GLUCOSE 132 (H) 09/12/2016   CHOL 111 09/12/2016   TRIG 57.0 09/12/2016   HDL 52.20 09/12/2016   LDLCALC 48 09/12/2016   ALT 16 09/12/2016   AST 19 09/12/2016   NA 138 09/12/2016   K 4.7 09/12/2016   CL 104 09/12/2016   CREATININE 1.19 09/12/2016   BUN 11 09/12/2016   CO2 28 09/12/2016   TSH 0.84 09/12/2016   PSA 0.01 (L) 09/12/2016   INR 2.6 10/16/2016   HGBA1C 5.3 09/13/2016      CHEST  2 VIEW 10/31/2015 - summary IMPRESSION: Mild chronic bronchitic changes and LEFT basilar scarring without acute infiltrate.   Assessment & Plan:

## 2016-10-28 ENCOUNTER — Other Ambulatory Visit: Payer: Self-pay | Admitting: Internal Medicine

## 2016-11-15 NOTE — Progress Notes (Addendum)
Subjective:   Kevin Hale is a 76 y.o. male who presents for an Initial Medicare Annual Wellness Visit.  Review of Systems  No ROS.  Medicare Wellness Visit. Additional risk factors are reflected in the social history.  Cardiac Risk Factors include: advanced age (>47men, >9 women);male gender;hypertension Sleep patterns: feels rested on waking, gets up 2 times nightly to void and sleeps 8 hours nightly.    Home Safety/Smoke Alarms: Feels safe in home. Smoke alarms in place.  Living environment; residence and Firearm Safety: 2-story house, no firearms. Lives with wife, no needs for DME, good support system Seat Belt Safety/Bike Helmet: Wears seat belt.   Male:   CCS- Last 10/25/11, recall 5 years, has  colonoscopy scheduled 11/26/16 PSA-  Lab Results  Component Value Date   PSA 0.01 (L) 09/12/2016   PSA 0.00 (L) 12/28/2014   PSA 0.01 (L) 12/23/2013      Objective:    Today's Vitals   11/16/16 1018  BP: (!) 144/68  Pulse: 73  Resp: 20  SpO2: 100%  Weight: 203 lb (92.1 kg)  Height: 5\' 9"  (1.753 m)   Body mass index is 29.98 kg/m.  Current Medications (verified) Outpatient Encounter Prescriptions as of 11/16/2016  Medication Sig  . allopurinol (ZYLOPRIM) 100 MG tablet TAKE 1 TABLET BY MOUTH ONCE DAILY  . b complex vitamins tablet Take 1 tablet by mouth daily.  . cetirizine (ZYRTEC) 10 MG tablet Take 1 tablet (10 mg total) by mouth daily.  . Cholecalciferol (VITAMIN D3) 1000 UNITS CAPS Take 1 Int'l Units by mouth daily.  Marland Kitchen lisinopril (PRINIVIL,ZESTRIL) 5 MG tablet TAKE ONE TABLET BY MOUTH ONCE DAILY.  . simvastatin (ZOCOR) 20 MG tablet TAKE 1 TABLET BY MOUTH AT BEDTIME.  Marland Kitchen warfarin (COUMADIN) 5 MG tablet TAKE AS DIRECTED  BY COUMADIN CLINIC.  . [DISCONTINUED] predniSONE (DELTASONE) 10 MG tablet 3 tabs by mouth per day for 3 days, 2tabs per day for 3 days,1tab per day for 3 day (Patient not taking: Reported on 11/16/2016)  . [DISCONTINUED] Probiotic Product (PROBIOTIC  DAILY PO) Take 1 tablet by mouth daily.  . [DISCONTINUED] triamcinolone (NASACORT) 55 MCG/ACT AERO nasal inhaler Place 2 sprays into the nose daily. (Patient not taking: Reported on 11/16/2016)   No facility-administered encounter medications on file as of 11/16/2016.     Allergies (verified) Pravastatin   History: Past Medical History:  Diagnosis Date  . Anxiety   . Atherosclerosis of abdominal aorta ([place]) 11/12/2011  . BPH (benign prostatic hyperplasia)   . Cancer of prostate ([place])   . Chronic anticoagulation 09/12/2016  . Diverticulosis of colon   . DJD (degenerative joint disease)    left foot, "pt denies"  . Glucose intolerance (impaired glucose tolerance) 11/03/2010  . History of colon polyps   . History of DVT (deep vein thrombosis)   . HTN (hypertension)   . Hypercholesteremia   . Lumbar degenerative disc disease 11/12/2011  . Plantar fasciitis    Past Surgical History:  Procedure Laterality Date  . INSERTION PROSTATE RADIATION SEED    . UVULECTOMY     Family History  Problem Relation Age of Onset  . Deep vein thrombosis Mother   . Diabetes Mother   . Healthy Brother   . Healthy Brother    Social History   Occupational History  . retired    Social History Main Topics  . Smoking status: Former Research scientist (life sciences)  . Smokeless tobacco: Never Used  . Alcohol use No  . Drug  use: No  . Sexual activity: Not Currently   Tobacco Counseling Counseling given: Not Answered   Activities of Daily Living In your present state of health, do you have any difficulty performing the following activities: 11/16/2016  Hearing? Y  Vision? N  Walking or climbing stairs? N  Dressing or bathing? N  Doing errands, shopping? N  Preparing Food and eating ? N  Using the Toilet? N  In the past six months, have you accidently leaked urine? N  Do you have problems with loss of bowel control? N  Managing your Medications? N  Managing your Finances? N  Housekeeping or managing your  Housekeeping? N  Some recent data might be hidden    Immunizations and Health Maintenance Immunization History  Administered Date(s) Administered  . Influenza Whole 11/28/2009, 11/06/2010, 11/10/2011  . Influenza, High Dose Seasonal PF 12/31/2014, 10/17/2016  . Influenza,inj,Quad PF,6+ Mos 11/11/2012, 12/22/2015  . Influenza-Unspecified 10/27/2013, 11/30/2014  . Pneumococcal Conjugate-13 11/11/2012, 06/15/2014  . Pneumococcal Polysaccharide-23 02/28/2004, 11/13/2012  . Td 02/27/2000  . Tdap 06/15/2014  . Zoster 05/19/2013   Health Maintenance Due  Topic Date Due  . COLONOSCOPY  10/24/2016    Patient Care Team: Biagio Borg, MD as PCP - Gwenevere Abbot, Docia Chuck, MD as Consulting Physician (Gastroenterology) Paulla Dolly Tamala Fothergill, DPM as Consulting Physician (Podiatry) Fay Records, MD as Consulting Physician (Cardiology) Lyndal Pulley, DO as Consulting Physician (Family Medicine) [place], [place] Eduardo Osier., MD as Attending Physician (Urology)  Indicate any recent Medical Services you may have received from other than [facility] providers in the past year (date may be approximate).    Assessment:   This is a routine wellness examination for Kevin Hale. Physical assessment deferred to PCP.   Hearing/Vision screen  Visual Acuity Screening   Right eye Left eye Both eyes  Without correction:     With correction:   20/25  Comments: Wears glasses, appointment yearly  Hearing Screening Comments: HOH, has hearing aides through [place] issues and exercise activities discussed: Current Exercise Habits: Home exercise routine, Type of exercise: treadmill;walking, Time (Minutes): 45, Frequency (Times/Week): 3, Weekly Exercise (Minutes/Week): 135, Intensity: Mild, Exercise limited by: None identified  Diet (meal preparation, eat out, water intake, caffeinated beverages, dairy products, fruits and vegetables): in general, a "healthy" diet  , well balanced ,eats a variety of fruits and vegetables  daily, limits salt, fat/cholesterol, sugar, caffeine, drinks 6-8 glasses of water daily.  Goals    . Stay active as possible and go fishing as much as possible          Enjoy life and family      Depression Screen PHQ 2/9 Scores 11/16/2016 07/12/2016 06/28/2015 12/31/2014  PHQ - 2 Score 0 0 0 0  PHQ- 9 Score 0 - - -    Fall Risk Fall Risk  11/16/2016 09/12/2016 07/12/2016 06/28/2015 12/31/2014  Falls in the past year? No No No No No    Cognitive Function: MMSE - Mini Mental State Exam 11/16/2016  Orientation to time 5  Orientation to Place 5  Registration 3  Attention/ Calculation 4  Recall 0  Language- name 2 objects 2  Language- repeat 1  Language- follow 3 step command 3  Language- read & follow direction 1  Write a sentence 1  Copy design 1  Total score 26        Screening Tests Health Maintenance  Topic Date Due  . COLONOSCOPY  10/24/2016  . TETANUS/TDAP  06/14/2024  .  INFLUENZA VACCINE  Completed  . PNA vac Low Risk Adult  Completed        Plan:    Continue doing brain stimulating activities (puzzles, reading, adult coloring books, staying active) to keep memory sharp.   Continue to eat heart healthy diet (full of fruits, vegetables, whole grains, lean protein, water--limit salt, fat, and sugar intake) and increase physical activity as tolerated.  I have personally reviewed and noted the following in the patient's chart:   . Medical and social history . Use of alcohol, tobacco or illicit drugs  . Current medications and supplements . Functional ability and status . Nutritional status . Physical activity . Advanced directives . List of other physicians . Vitals . Screenings to include cognitive, depression, and falls . Referrals and appointments  In addition, I have reviewed and discussed with patient certain preventive protocols, quality metrics, and best practice recommendations. A written personalized care plan for preventive services as well as general  preventive health recommendations were provided to patient.     Michiel Cowboy, RN   11/16/2016    Medical screening examination/treatment/procedure(s) were performed by non-physician practitioner and as supervising physician I was immediately available for consultation/collaboration. I agree with above. Cathlean Cower, MD

## 2016-11-15 NOTE — Progress Notes (Signed)
Pre visit review using our clinic review tool, if applicable. No additional management support is needed unless otherwise documented below in the visit note. 

## 2016-11-16 ENCOUNTER — Ambulatory Visit (INDEPENDENT_AMBULATORY_CARE_PROVIDER_SITE_OTHER): Payer: Medicare Other | Admitting: *Deleted

## 2016-11-16 VITALS — BP 144/68 | HR 73 | Resp 20 | Ht 69.0 in | Wt 203.0 lb

## 2016-11-16 DIAGNOSIS — Z0000 Encounter for general adult medical examination without abnormal findings: Secondary | ICD-10-CM

## 2016-11-16 NOTE — Patient Instructions (Signed)
Continue doing brain stimulating activities (puzzles, reading, adult coloring books, staying active) to keep memory sharp.   Continue to eat heart healthy diet (full of fruits, vegetables, whole grains, lean protein, water--limit salt, fat, and sugar intake) and increase physical activity as tolerated.   Kevin Hale , Thank you for taking time to come for your Medicare Wellness Visit. I appreciate your ongoing commitment to your health goals. Please review the following plan we discussed and let me know if I can assist you in the future.   These are the goals we discussed: Goals    . Stay active as possible and go fishing as much as possible          Enjoy life and family       This is a list of the screening recommended for you and due dates:  Health Maintenance  Topic Date Due  . Colon Cancer Screening  10/24/2016  . Tetanus Vaccine  06/14/2024  . Flu Shot  Completed  . Pneumonia vaccines  Completed

## 2016-11-20 ENCOUNTER — Encounter: Payer: Self-pay | Admitting: Internal Medicine

## 2016-11-26 ENCOUNTER — Ambulatory Visit (AMBULATORY_SURGERY_CENTER): Payer: Medicare Other | Admitting: Internal Medicine

## 2016-11-26 ENCOUNTER — Encounter: Payer: Self-pay | Admitting: Internal Medicine

## 2016-11-26 VITALS — BP 100/55 | HR 69 | Temp 97.8°F | Resp 21 | Ht 69.0 in | Wt 197.0 lb

## 2016-11-26 DIAGNOSIS — D120 Benign neoplasm of cecum: Secondary | ICD-10-CM

## 2016-11-26 DIAGNOSIS — D123 Benign neoplasm of transverse colon: Secondary | ICD-10-CM | POA: Diagnosis not present

## 2016-11-26 DIAGNOSIS — Z86010 Personal history of colonic polyps: Secondary | ICD-10-CM | POA: Diagnosis present

## 2016-11-26 DIAGNOSIS — D122 Benign neoplasm of ascending colon: Secondary | ICD-10-CM

## 2016-11-26 DIAGNOSIS — D124 Benign neoplasm of descending colon: Secondary | ICD-10-CM | POA: Diagnosis not present

## 2016-11-26 MED ORDER — SODIUM CHLORIDE 0.9 % IV SOLN: 500.0000 mL | INTRAVENOUS | Status: DC

## 2016-11-26 NOTE — Progress Notes (Signed)
Called to room to assist during endoscopic procedure.  Patient ID and intended procedure confirmed with present staff. Received instructions for my participation in the procedure from the performing physician.  

## 2016-11-26 NOTE — Progress Notes (Signed)
To PACU, VSS. Report to RN.tb 

## 2016-11-26 NOTE — Op Note (Signed)
[facility] Patient Name: Kevin Hale Procedure Date: 11/26/2016 1:48 PM MRN: [id number] Endoscopist: Docia Chuck. Henrene Pastor , MD Age: 76 Referring MD:  Date of Birth: 1940/06/06 Gender: Male Account #: [id number] Procedure:                Colonoscopy, with cold snare polypectomy x 7 Indications:              High risk colon cancer surveillance: Personal                            history of multiple (3 or more) adenomas. Index                            examination in [place] 15 years ago. Subsequent                            examinations 2008 and 2013 Medicines:                Monitored Anesthesia Care Procedure:                Pre-Anesthesia Assessment:                           - Prior to the procedure, a History and Physical                            was performed, and patient medications and                            allergies were reviewed. The patient's tolerance of                            previous anesthesia was also reviewed. The risks                            and benefits of the procedure and the sedation                            options and risks were discussed with the patient.                            All questions were answered, and informed consent                            was obtained. Prior Anticoagulants: The patient has                            taken Coumadin (warfarin), last dose was day of                            procedure. ASA Grade Assessment: III - A patient                            with severe systemic disease. After reviewing the  risks and benefits, the patient was deemed in                            satisfactory condition to undergo the procedure.                           After obtaining informed consent, the colonoscope                            was passed under direct vision. Throughout the                            procedure, the patient's blood pressure, pulse, and                            oxygen  saturations were monitored continuously. The                            Colonoscope was introduced through the anus and                            advanced to the the cecum, identified by                            appendiceal orifice and ileocecal valve. The                            ileocecal valve, appendiceal orifice, and rectum                            were photographed. The quality of the bowel                            preparation was excellent. The colonoscopy was                            performed without difficulty. The patient tolerated                            the procedure well. The bowel preparation used was                            SUPREP. Scope In: 2:00:29 PM Scope Out: 2:18:53 PM Scope Withdrawal Time: 0 hours 15 minutes 52 seconds  Total Procedure Duration: 0 hours 18 minutes 24 seconds  Findings:                 Seven polyps were found in the descending colon,                            transverse colon, ascending colon and cecum. The                            polyps were 3 to 5 mm in size. These polyps were  removed with a cold snare. Resection and retrieval                            were complete.                           Multiple small and large-mouthed diverticula were                            found in the left colon and right colon.                           Internal hemorrhoids were found during                            retroflexion. The hemorrhoids were small.                           The exam was otherwise without abnormality on                            direct and retroflexion views. Complications:            No immediate complications. Estimated blood loss:                            None. Estimated Blood Loss:     Estimated blood loss: none. Impression:               - Seven 3 to 5 mm polyps in the descending colon,                            in the transverse colon, in the ascending colon and                             in the cecum, removed with a cold snare. Resected                            and retrieved.                           - Diverticulosis in the left colon and in the right                            colon.                           - Internal hemorrhoids.                           - The examination was otherwise normal on direct                            and retroflexion views. Recommendation:           - Repeat colonoscopy in 3 years for surveillance,  if 3 or more adenomas and patient medically                            fit/willing.                           - Patient has a contact number available for                            emergencies. The signs and symptoms of potential                            delayed complications were discussed with the                            patient. Return to normal activities tomorrow.                            Written discharge instructions were provided to the                            patient.                           - Resume previous diet.                           - Continue present medications.                           - Await pathology results. Docia Chuck. Henrene Pastor, MD 11/26/2016 2:24:19 PM This report has been signed electronically.

## 2016-11-26 NOTE — Patient Instructions (Signed)
   Information on polyps,diverticulosis,and hemorrhoids given to you today  Await pathology results on polyps removed    YOU HAD AN ENDOSCOPIC PROCEDURE TODAY AT THE [facility]:   Refer to the procedure report that was given to you for any specific questions about what was found during the examination.  If the procedure report does not answer your questions, please call your gastroenterologist to clarify.  If you requested that your care partner not be given the details of your procedure findings, then the procedure report has been included in a sealed envelope for you to review at your convenience later.  YOU SHOULD EXPECT: Some feelings of bloating in the abdomen. Passage of more gas than usual.  Walking can help get rid of the air that was put into your GI tract during the procedure and reduce the bloating. If you had a lower endoscopy (such as a colonoscopy or flexible sigmoidoscopy) you may notice spotting of blood in your stool or on the toilet paper. If you underwent a bowel prep for your procedure, you may not have a normal bowel movement for a few days.  Please Note:  You might notice some irritation and congestion in your nose or some drainage.  This is from the oxygen used during your procedure.  There is no need for concern and it should clear up in a day or so.  SYMPTOMS TO REPORT IMMEDIATELY:   Following lower endoscopy (colonoscopy or flexible sigmoidoscopy):  Excessive amounts of blood in the stool  Significant tenderness or worsening of abdominal pains  Swelling of the abdomen that is new, acute  Fever of 100F or higher    For urgent or emergent issues, a gastroenterologist can be reached at any hour by calling [phone].   DIET:  We do recommend a small meal at first, but then you may proceed to your regular diet.  Drink plenty of fluids but you should avoid alcoholic beverages for 24 hours.  ACTIVITY:  You should plan to take it easy for the rest of  today and you should NOT DRIVE or use heavy machinery until tomorrow (because of the sedation medicines used during the test).    FOLLOW UP: Our staff will call the number listed on your records the next business day following your procedure to check on you and address any questions or concerns that you may have regarding the information given to you following your procedure. If we do not reach you, we will leave a message.  However, if you are feeling well and you are not experiencing any problems, there is no need to return our call.  We will assume that you have returned to your regular daily activities without incident.  If any biopsies were taken you will be contacted by phone or by letter within the next 1-3 weeks.  Please call us at [phone] if you have not heard about the biopsies in 3 weeks.    SIGNATURES/CONFIDENTIALITY: You and/or your care partner have signed paperwork which will be entered into your electronic medical record.  These signatures attest to the fact that that the information above on your After Visit Summary has been reviewed and is understood.  Full responsibility of the confidentiality of this discharge information lies with you and/or your care-partner. 

## 2016-11-27 ENCOUNTER — Telehealth: Payer: Self-pay | Admitting: *Deleted

## 2016-11-27 ENCOUNTER — Ambulatory Visit (INDEPENDENT_AMBULATORY_CARE_PROVIDER_SITE_OTHER): Payer: Medicare Other | Admitting: *Deleted

## 2016-11-27 DIAGNOSIS — Z7901 Long term (current) use of anticoagulants: Secondary | ICD-10-CM

## 2016-11-27 DIAGNOSIS — I82409 Acute embolism and thrombosis of unspecified deep veins of unspecified lower extremity: Secondary | ICD-10-CM

## 2016-11-27 DIAGNOSIS — I2699 Other pulmonary embolism without acute cor pulmonale: Secondary | ICD-10-CM

## 2016-11-27 DIAGNOSIS — Z5181 Encounter for therapeutic drug level monitoring: Secondary | ICD-10-CM | POA: Diagnosis not present

## 2016-11-27 LAB — POCT INR: INR: 2.7

## 2016-11-27 NOTE — Telephone Encounter (Signed)
  Follow up Call-  Call back number 11/26/2016  Post procedure Call Back phone  # [phone]  Permission to leave phone message Yes  Some recent data might be hidden    [phone] is the pt's number

## 2016-11-27 NOTE — Telephone Encounter (Signed)
  Follow up Call-  Call back number 11/26/2016  Post procedure Call Back phone  # [phone]  Permission to leave phone message Yes  Some recent data might be hidden     Patient questions:  Do you have a fever, pain , or abdominal swelling? No. Pain Score  0 *  Have you tolerated food without any problems? Yes.    Have you been able to return to your normal activities? Yes.    Do you have any questions about your discharge instructions: Diet   No. Medications  No. Follow up visit  No.  Do you have questions or concerns about your Care? No.  Actions: * If pain score is 4 or above: No action needed, pain <4.

## 2016-11-30 ENCOUNTER — Encounter: Payer: Self-pay | Admitting: Internal Medicine

## 2016-12-21 ENCOUNTER — Other Ambulatory Visit: Payer: Self-pay | Admitting: Internal Medicine

## 2017-01-08 ENCOUNTER — Ambulatory Visit (INDEPENDENT_AMBULATORY_CARE_PROVIDER_SITE_OTHER): Payer: Medicare Other | Admitting: *Deleted

## 2017-01-08 DIAGNOSIS — Z5181 Encounter for therapeutic drug level monitoring: Secondary | ICD-10-CM

## 2017-01-08 DIAGNOSIS — I82409 Acute embolism and thrombosis of unspecified deep veins of unspecified lower extremity: Secondary | ICD-10-CM

## 2017-01-08 DIAGNOSIS — I2699 Other pulmonary embolism without acute cor pulmonale: Secondary | ICD-10-CM

## 2017-01-08 DIAGNOSIS — Z7901 Long term (current) use of anticoagulants: Secondary | ICD-10-CM | POA: Diagnosis not present

## 2017-01-08 LAB — POCT INR: INR: 2

## 2017-01-08 NOTE — Patient Instructions (Signed)
Continue same dosage 1 tablet everyday except 1.5 tablets on Sundays and Thursdays. Recheck INR in 6 weeks. Main#[phone]

## 2017-01-10 ENCOUNTER — Other Ambulatory Visit: Payer: Self-pay | Admitting: Internal Medicine

## 2017-01-11 DIAGNOSIS — Z961 Presence of intraocular lens: Secondary | ICD-10-CM | POA: Diagnosis not present

## 2017-01-11 DIAGNOSIS — H40003 Preglaucoma, unspecified, bilateral: Secondary | ICD-10-CM | POA: Diagnosis not present

## 2017-01-11 DIAGNOSIS — D3101 Benign neoplasm of right conjunctiva: Secondary | ICD-10-CM | POA: Diagnosis not present

## 2017-01-14 DIAGNOSIS — C61 Malignant neoplasm of prostate: Secondary | ICD-10-CM | POA: Insufficient documentation

## 2017-01-14 DIAGNOSIS — Z86718 Personal history of other venous thrombosis and embolism: Secondary | ICD-10-CM | POA: Insufficient documentation

## 2017-01-14 DIAGNOSIS — F418 Other specified anxiety disorders: Secondary | ICD-10-CM | POA: Insufficient documentation

## 2017-01-14 DIAGNOSIS — N400 Enlarged prostate without lower urinary tract symptoms: Secondary | ICD-10-CM | POA: Insufficient documentation

## 2017-01-14 DIAGNOSIS — I10 Essential (primary) hypertension: Secondary | ICD-10-CM | POA: Insufficient documentation

## 2017-01-14 DIAGNOSIS — M1990 Unspecified osteoarthritis, unspecified site: Secondary | ICD-10-CM | POA: Insufficient documentation

## 2017-01-14 DIAGNOSIS — K5730 Diverticulosis of large intestine without perforation or abscess without bleeding: Secondary | ICD-10-CM | POA: Insufficient documentation

## 2017-01-14 DIAGNOSIS — E7800 Pure hypercholesterolemia, unspecified: Secondary | ICD-10-CM | POA: Insufficient documentation

## 2017-01-14 DIAGNOSIS — M722 Plantar fascial fibromatosis: Secondary | ICD-10-CM | POA: Insufficient documentation

## 2017-01-14 DIAGNOSIS — Z860100 Personal history of colon polyps, unspecified: Secondary | ICD-10-CM | POA: Insufficient documentation

## 2017-01-25 ENCOUNTER — Ambulatory Visit: Payer: Medicare Other | Admitting: Internal Medicine

## 2017-01-25 ENCOUNTER — Encounter: Payer: Self-pay | Admitting: Internal Medicine

## 2017-01-25 VITALS — BP 158/90 | HR 69 | Ht 69.0 in | Wt 207.6 lb

## 2017-01-25 DIAGNOSIS — E7800 Pure hypercholesterolemia, unspecified: Secondary | ICD-10-CM

## 2017-01-25 DIAGNOSIS — R0602 Shortness of breath: Secondary | ICD-10-CM | POA: Diagnosis not present

## 2017-01-25 DIAGNOSIS — I700 Atherosclerosis of aorta: Secondary | ICD-10-CM

## 2017-01-25 DIAGNOSIS — I10 Essential (primary) hypertension: Secondary | ICD-10-CM | POA: Diagnosis not present

## 2017-01-25 MED ORDER — LISINOPRIL 10 MG PO TABS: 10.0000 mg | ORAL_TABLET | Freq: Every day | ORAL | 6 refills | Status: DC

## 2017-01-25 MED ORDER — FLUTICASONE PROPIONATE HFA 220 MCG/ACT IN AERO: 1.0000 | INHALATION_SPRAY | Freq: Two times a day (BID) | RESPIRATORY_TRACT | 3 refills | Status: DC

## 2017-01-25 NOTE — Patient Instructions (Signed)
Your physician has recommended you make the following change in your medication:  1.) increase lisinopril to 10 mg once daily 2.) start flovent inhaler 1 puff two times daily, rinse your mouth after each use  Your physician recommends that you return for lab work today (CBC, BNP, BMET)  Your physician recommends that you schedule a follow-up appointment in: 1-2 MONTHS WITH APP. ([place])

## 2017-01-25 NOTE — Progress Notes (Signed)
Cardiology Office Note   Date:  01/25/2017   ID:  Kevin Hale, DOB [birth year], MRN [id number]  PCP:  Biagio Borg, MD  Cardiologist:   Dorris Carnes, MD   Pt presents for f/u of DVT    History of Present Illness: Kevin Hale is a 76 y.o. male with a history of chronic LE DVT and PE  Echo in 2012 showed normal LVEF and RVEF   I saw him in Nov 2017  Breatihig OK  Occasional dizziness  Not long  No Cp  Walking some but not often  Cuaght cold in sSept  Still bothered by cough  Outpatient Medications Prior to Visit  Medication Sig Dispense Refill  . allopurinol (ZYLOPRIM) 100 MG tablet TAKE 1 TABLET BY MOUTH ONCE DAILY 90 tablet 1  . b complex vitamins tablet Take 1 tablet by mouth daily.    . cetirizine (ZYRTEC) 10 MG tablet Take 1 tablet (10 mg total) by mouth daily. 30 tablet 11  . Cholecalciferol (VITAMIN D3) 1000 UNITS CAPS Take 1 Int'l Units by mouth daily.    Marland Kitchen lisinopril (PRINIVIL,ZESTRIL) 5 MG tablet Take 1 tablet (5 mg total) daily by mouth. Pt needs to schedule appt with Dr.Ross for more refills. 1st attempt. Thank you 30 tablet 0  . simvastatin (ZOCOR) 20 MG tablet TAKE 1 TABLET BY MOUTH AT BEDTIME. 90 tablet 3  . warfarin (COUMADIN) 5 MG tablet TAKE AS DIRECTED BY MOUTH BY COUMADIN CLINIC 120 tablet 1   Facility-Administered Medications Prior to Visit  Medication Dose Route Frequency Provider Last Rate Last Dose  . 0.9 %  sodium chloride infusion  500 mL Intravenous Continuous Irene Shipper, MD         Allergies:   Pravastatin   Past Medical History:  Diagnosis Date  . Anxiety   . Atherosclerosis of abdominal aorta ([place]) 11/12/2011  . BPH (benign prostatic hyperplasia)   . Cancer of prostate ([place])   . Chronic anticoagulation 09/12/2016  . Diverticulosis of colon   . DJD (degenerative joint disease)    left foot, "pt denies"  . Glucose intolerance (impaired glucose tolerance) 11/03/2010  . History of colon polyps   . History of DVT (deep vein thrombosis)     . HTN (hypertension)   . Hypercholesteremia   . Lumbar degenerative disc disease 11/12/2011  . Plantar fasciitis     Past Surgical History:  Procedure Laterality Date  . INSERTION PROSTATE RADIATION SEED    . UVULECTOMY       Social History:  The patient  reports that he has quit smoking. he has never used smokeless tobacco. He reports that he does not drink alcohol or use drugs.   Family History:  The patient's family history includes Deep vein thrombosis in his mother; Diabetes in his mother; Healthy in his brother and brother.    ROS:  Please see the history of present illness. All other systems are reviewed and  Negative to the above problem except as noted.    PHYSICAL EXAM: VS:  BP (!) 158/90   Pulse 69   Ht 5\' 9"  (1.753 m)   Wt 207 lb 9.6 oz (94.2 kg)   BMI 30.66 kg/m   GEN: Well nourished, well developed, in no acute distress  HEENT: normal  Neck: no JVD, carotid bruits, or masses Cardiac: RRR; no murmurs, rubs, or gallops,no edema  Respiratory:  Very mild wheeze with forced expiration   GI: soft, nontender, nondistended, + BS  No hepatomegaly  MS: no deformity Moving all extremities   Skin: warm and dry, no rash Neuro:  Strength and sensation are intact Psych: euthymic mood, full affect   EKG:  EKG is ordered today.  SR 69  LAFB  LVH  Occasiona PVC   Lipid Panel    Component Value Date/Time   CHOL 111 09/12/2016 1031   TRIG 57.0 09/12/2016 1031   HDL 52.20 09/12/2016 1031   CHOLHDL 2 09/12/2016 1031   VLDL 11.4 09/12/2016 1031   LDLCALC 48 09/12/2016 1031      Wt Readings from Last 3 Encounters:  01/25/17 207 lb 9.6 oz (94.2 kg)  11/26/16 197 lb (89.4 kg)  11/16/16 203 lb (92.1 kg)      ASSESSMENT AND PLAN: 1/  Hx of DVT/PE  Keep on coumadin  Check CBC    2"Cold"  Volume overall looks OK  Will check CBC and BNP and BMET   Echo in august LVEF and RVEF normal  Give Rx for Flovent  Mild wheeze on exam   F/U in a few wks   3  HTN  BP is higher   WIll increase lisinorpl to 10 mg  F/U in a few wks    4  PVOD  Atherosclerosis of aorta  NO symptoms to sugg symptomatic angina   5  HL  Keep on statin     F/U in 1 month  Stay active     Current medicines are reviewed at length with the patient today.  The patient does not have concerns regarding medicines.  Signed, Dorris Carnes, MD  01/25/2017 12:29 PM    [facility], [place], [place]  [address] Phone: [phone]; Fax: [phone]

## 2017-01-26 LAB — BASIC METABOLIC PANEL
BUN/Creatinine Ratio: 9 — ABNORMAL LOW (ref 10–24)
BUN: 10 mg/dL (ref 8–27)
CALCIUM: 9.8 mg/dL (ref 8.6–10.2)
CHLORIDE: 102 mmol/L (ref 96–106)
CO2: 25 mmol/L (ref 20–29)
CREATININE: 1.14 mg/dL (ref 0.76–1.27)
GFR calc non Af Amer: 62 mL/min/{1.73_m2} (ref >59)
GFR, EST AFRICAN AMERICAN: 71 mL/min/{1.73_m2} (ref >59)
Glucose: 76 mg/dL (ref 65–99)
Potassium: 4.4 mmol/L (ref 3.5–5.2)
Sodium: 140 mmol/L (ref 134–144)

## 2017-01-26 LAB — CBC
HEMOGLOBIN: 16.4 g/dL (ref 13.0–17.7)
Hematocrit: 49.8 % (ref 37.5–51.0)
MCH: 29 pg (ref 26.6–33.0)
MCHC: 32.9 g/dL (ref 31.5–35.7)
MCV: 88 fL (ref 79–97)
Platelets: 273 10*3/uL (ref 150–379)
RBC: 5.65 x10E6/uL (ref 4.14–5.80)
RDW: 13.1 % (ref 12.3–15.4)
WBC: 8.4 10*3/uL (ref 3.4–10.8)

## 2017-01-26 LAB — PRO B NATRIURETIC PEPTIDE: NT-PRO BNP: 95 pg/mL (ref 0–486)

## 2017-02-20 ENCOUNTER — Ambulatory Visit (INDEPENDENT_AMBULATORY_CARE_PROVIDER_SITE_OTHER): Payer: Medicare Other

## 2017-02-20 DIAGNOSIS — I82409 Acute embolism and thrombosis of unspecified deep veins of unspecified lower extremity: Secondary | ICD-10-CM

## 2017-02-20 DIAGNOSIS — I2699 Other pulmonary embolism without acute cor pulmonale: Secondary | ICD-10-CM

## 2017-02-20 DIAGNOSIS — Z7901 Long term (current) use of anticoagulants: Secondary | ICD-10-CM

## 2017-02-20 DIAGNOSIS — Z5181 Encounter for therapeutic drug level monitoring: Secondary | ICD-10-CM

## 2017-02-20 LAB — POCT INR: INR: 1.9

## 2017-02-20 NOTE — Patient Instructions (Signed)
Take an extra 1/2 tablet today, then resume same dosage 1 tablet everyday except 1.5 tablets on Sundays and Thursdays. Recheck INR in 6 weeks. Main#[phone]

## 2017-02-25 DIAGNOSIS — R3915 Urgency of urination: Secondary | ICD-10-CM | POA: Diagnosis not present

## 2017-03-05 ENCOUNTER — Ambulatory Visit: Payer: Medicare Other | Admitting: Physician Assistant

## 2017-03-05 ENCOUNTER — Encounter: Payer: Self-pay | Admitting: Physician Assistant

## 2017-03-05 VITALS — BP 120/80 | HR 82 | Ht 69.0 in | Wt 205.8 lb

## 2017-03-05 DIAGNOSIS — I10 Essential (primary) hypertension: Secondary | ICD-10-CM | POA: Diagnosis not present

## 2017-03-05 NOTE — Patient Instructions (Signed)
Medication Instructions:  1. Your physician recommends that you continue on your current medications as directed. Please refer to the Current Medication list given to you today.   Labwork: TODAY BMET  Testing/Procedures: NONE ORDERED   Follow-Up: DR. Harrington Challenger 12/2017; WE WILL SEND OUT A REMINDER LETTER ABOUT 2 MONTHS EARLIER TO HAVE YOU CALL AND MAKE AN APPT  Any Other Special Instructions Will Be Listed Below (If Applicable).     If you need a refill on your cardiac medications before your next appointment, please call your pharmacy.

## 2017-03-05 NOTE — Progress Notes (Signed)
Cardiology Office Note:    Date:  03/05/2017   ID:  GAY RAPE, DOB [birth year], MRN [id number]  PCP:  Biagio Borg, MD  Cardiologist:  Dorris Carnes, MD   Referring MD: Biagio Borg, MD   Chief Complaint  Patient presents with  . Follow-up    Blood pressure    History of Present Illness:    Kevin Hale is a 77 y.o. 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  He is on chronic anticoagulation with Coumadin which is managed in our Coumadin clinic.  He was last seen by Dr. Harrington Challenger in November 2018.  ACE inhibitor was adjusted for better blood pressure control.     Kevin Hale returns for follow-up.  Overall, he is doing well.  He has episodic diaphoresis.  He has had this symptom for years without change.  He denies chest discomfort, significant dyspnea, syncope, orthopnea, PND.  He has chronic edema related to prior DVT.  Prior CV studies:   The following studies were reviewed today:  Echo 09/26/16 Mild concentric LVH, EF 55-60, normal wall motion, grade 1 diastolic dysfunction, MAC, mild TR  Past Medical History:  Diagnosis Date  . Anxiety   . Atherosclerosis of abdominal aorta ([place]) 11/12/2011  . BPH (benign prostatic hyperplasia)   . Cancer of prostate ([place])   . Chronic anticoagulation 09/12/2016  . Diverticulosis of colon   . DJD (degenerative joint disease)    left foot, "pt denies"  . Glucose intolerance (impaired glucose tolerance) 11/03/2010  . History of colon polyps   . History of DVT (deep vein thrombosis)   . HTN (hypertension)   . Hypercholesteremia   . Lumbar degenerative disc disease 11/12/2011  . Plantar fasciitis     Past Surgical History:  Procedure Laterality Date  . INSERTION PROSTATE RADIATION SEED    . UVULECTOMY      Current Medications: Current Meds  Medication Sig  . allopurinol (ZYLOPRIM) 100 MG tablet TAKE 1 TABLET BY MOUTH ONCE DAILY  . b complex vitamins tablet Take 1 tablet by mouth daily.  . Cholecalciferol  (VITAMIN D3) 1000 UNITS CAPS Take 1 Int'l Units by mouth daily.  . fluticasone (FLOVENT HFA) 220 MCG/ACT inhaler Inhale 1 puff into the lungs 2 (two) times daily.  Marland Kitchen lisinopril (PRINIVIL,ZESTRIL) 10 MG tablet Take 1 tablet (10 mg total) by mouth daily.  . simvastatin (ZOCOR) 20 MG tablet TAKE 1 TABLET BY MOUTH AT BEDTIME.  Marland Kitchen warfarin (COUMADIN) 5 MG tablet TAKE AS DIRECTED BY MOUTH BY COUMADIN CLINIC   Current Facility-Administered Medications for the 03/05/17 encounter (Office Visit) with Richardson Dopp T, PA-C  Medication  . 0.9 %  sodium chloride infusion     Allergies:   Pravastatin   Social History   Tobacco Use  . Smoking status: Former Research scientist (life sciences)  . Smokeless tobacco: Never Used  Substance Use Topics  . Alcohol use: No  . Drug use: No     Family Hx: The patient's family history includes Deep vein thrombosis in his mother; Diabetes in his mother; Healthy in his brother and brother. There is no history of Colon cancer or Rectal cancer.  ROS:   Please see the history of present illness.    ROS All other systems reviewed and are negative.   EKGs/Labs/Other Test Reviewed:    EKG:  EKG is not ordered today.    Recent Labs: 09/12/2016: ALT 16; TSH 0.84 01/25/2017: BUN 10; Creatinine, Ser  1.14; Hemoglobin 16.4; NT-Pro BNP 95; Platelets 273; Potassium 4.4; Sodium 140   Recent Lipid Panel Lab Results  Component Value Date/Time   CHOL 111 09/12/2016 10:31 AM   TRIG 57.0 09/12/2016 10:31 AM   HDL 52.20 09/12/2016 10:31 AM   CHOLHDL 2 09/12/2016 10:31 AM   LDLCALC 48 09/12/2016 10:31 AM    Physical Exam:    VS:  BP 120/80   Pulse 82   Ht _0  (1.753 m)   Wt 205 lb 12.8 oz (93.4 kg)   SpO2 97%   BMI 30.39 kg/m     Wt Readings from Last 3 Encounters:  03/05/17 205 lb 12.8 oz (93.4 kg)  01/25/17 207 lb 9.6 oz (94.2 kg)  11/26/16 197 lb (89.4 kg)     Physical Exam  Constitutional: He is oriented to person, place, and time. He appears well-developed and well-nourished.  No distress.  HENT:  Head: Normocephalic and atraumatic.  Neck: No JVD present.  Cardiovascular: Normal rate and regular rhythm.  No murmur heard. Pulmonary/Chest: Effort normal. He has no rales.  Abdominal: Soft. He exhibits no distension.  Musculoskeletal: He exhibits edema (trace R lower ext edema).  Neurological: He is alert and oriented to person, place, and time.  Skin: Skin is warm and dry.    ASSESSMENT:    1. Essential hypertension    PLAN:    In order of problems listed above:  1. Essential hypertension  The patient's blood pressure is controlled on his current regimen.  Continue current therapy.   - Plan: Basic Metabolic Panel (BMET) today    Dispo:  Return in about 11 months (around 02/02/2018) for Routine Follow Up, w/ Dr. Harrington Challenger.   Medication Adjustments/Labs and Tests Ordered: Current medicines are reviewed at length with the patient today.  Concerns regarding medicines are outlined above.  Tests Ordered: Orders Placed This Encounter  Procedures  . Basic Metabolic Panel (BMET)   Medication Changes: No orders of the defined types were placed in this encounter.   Signed, Richardson Dopp, PA-C  03/05/2017 12:06 PM    [facility] [address], [place], [place]  [address] Phone: [phone]; Fax: [phone]

## 2017-03-06 LAB — BASIC METABOLIC PANEL
BUN/Creatinine Ratio: 9 — ABNORMAL LOW (ref 10–24)
BUN: 10 mg/dL (ref 8–27)
CALCIUM: 9.5 mg/dL (ref 8.6–10.2)
CO2: 22 mmol/L (ref 20–29)
Chloride: 104 mmol/L (ref 96–106)
Creatinine, Ser: 1.12 mg/dL (ref 0.76–1.27)
GFR calc Af Amer: 73 mL/min/{1.73_m2} (ref >59)
GFR calc non Af Amer: 63 mL/min/{1.73_m2} (ref >59)
Glucose: 82 mg/dL (ref 65–99)
POTASSIUM: 4.5 mmol/L (ref 3.5–5.2)
Sodium: 142 mmol/L (ref 134–144)

## 2017-03-12 ENCOUNTER — Ambulatory Visit: Payer: Medicare Other | Admitting: Physician Assistant

## 2017-03-19 ENCOUNTER — Other Ambulatory Visit: Payer: Self-pay | Admitting: Internal Medicine

## 2017-03-22 ENCOUNTER — Ambulatory Visit: Payer: Self-pay

## 2017-03-22 NOTE — Telephone Encounter (Signed)
  Reason for Disposition . [1] MODERATE pain (e.g., interferes with normal activities, limping) AND [2] present > 3 days  Answer Assessment - Initial Assessment Questions 1. ONSET: "When did the pain start?"      Started  A year ago 2. LOCATION: "Where is the pain located?"      Hurts inside of thigh down to the calf 3. PAIN: "How bad is the pain?"    (Scale 1-10; or mild, moderate, severe)   -  MILD (1-3): doesn't interfere with normal activities    -  MODERATE (4-7): interferes with normal activities (e.g., work or school) or awakens from sleep, limping    -  SEVERE (8-10): excruciating pain, unable to do any normal activities, unable to walk     6 4. WORK OR EXERCISE: "Has there been any recent work or exercise that involved this part of the body?"      No 5. CAUSE: "What do you think is causing the leg pain?"     Not sure - maybe a blood clot 6. OTHER SYMPTOMS: "Do you have any other symptoms?" (e.g., chest pain, back pain, breathing difficulty, swelling, rash, fever, numbness, weakness)     Leg is warm at the calf area and it has been swollen awhile 7. PREGNANCY: "Is there any chance you are pregnant?" "When was your last menstrual period?"     No  Protocols used: LEG PAIN-A-AH Reports leg has been hurting worse the last 2 weeks. Reports he is on Coumadin and INR "has been good."

## 2017-03-24 NOTE — Progress Notes (Signed)
Subjective:    Patient ID: Kevin Hale, male    DOB: 28-Apr-1940, 77 y.o.   MRN: [id number]  HPI He is here for an acute visit.   Right leg pain and swelling:  It started more than two weeks ago, but has gotten worse in the past two weeks.  He has a pain in his right upper leg that radiates down his right.  He notices most when sitting or laying.  He feels it some when walking - it is more of a soreness, but it does not ache.   He denies back pain.  When he stands for too long he has to walk around because his leg aches.   No change in chronic swelling in the right leg.  He wears his compression hose daily.  He denies any injuries.  There is no numbness, tingling or weakness in the legs.  He denies any fevers, shortness of breath, chest pain or palpitations.  He is taking his warfarin daily and his INR has been stable 1.9-2.1.    Medications and allergies reviewed with patient and updated if appropriate.  Patient Active Problem List   Diagnosis Date Noted  . Plantar fasciitis   . Hypercholesteremia   . HTN (hypertension)   . History of DVT (deep vein thrombosis)   . History of colon polyps   . DJD (degenerative joint disease)   . Diverticulosis of colon   . Cancer of prostate ([place])   . BPH (benign prostatic hyperplasia)   . Anxiety   . Allergic rhinitis 10/17/2016  . Cardiac murmur 09/12/2016  . Chronic anticoagulation 09/12/2016  . Low back pain with right-sided sciatica 07/15/2016  . Localized rash 12/22/2015  . Foot dermatitis 12/22/2015  . Pre-ulcerative corn or callous 12/22/2015  . Cough 11/03/2015  . General weakness 07/29/2015  . Influenza with respiratory manifestation other than pneumonia 06/07/2015  . Right hip pain 05/23/2015  . Trigger finger, acquired 11/17/2014  . Bilateral leg pain 03/16/2014  . Sinusitis, acute 06/03/2013  . Chronic bronchitis ([place]) 06/03/2013  . Encounter for therapeutic drug monitoring 03/25/2013  . Pneumonia involving left lung  07/29/2012  . Lumbar degenerative disc disease 11/12/2011  . Atherosclerosis of abdominal aorta ([place]) 11/12/2011  . Sebaceous cyst 11/12/2011  . Wheezing 01/29/2011  . Impaired glucose tolerance 11/03/2010  . Preventative health care 11/03/2010  . Glucose intolerance (impaired glucose tolerance) 11/03/2010  . PULMONARY EMBOLISM 03/09/2010  . ANKLE PAIN, RIGHT 11/28/2009  . ANXIETY 11/01/2009  . LEG CRAMPS 11/01/2009  . CHEST PAIN, RIGHT 01/17/2009  . INTERNAL HEMORRHOIDS WITHOUT MENTION COMP 03/24/2008  . PLANTAR FASCIITIS 03/24/2008  . PROSTATE CANCER, HX OF 03/24/2008  . FATIGUE 11/05/2007  . Dysuria 11/05/2007  . Essential hypertension 10/22/2007  . DIVERTICULOSIS, COLON 10/22/2007  . LEG PAIN, RIGHT 10/21/2007  . COLONIC POLYPS 11/20/2006  . HYPERCHOLESTEROLEMIA 11/20/2006  . DVT 11/20/2006  . LEG EDEMA 11/20/2006  . BENIGN PROSTATIC HYPERTROPHY, HX OF 11/20/2006    Current Outpatient Medications on File Prior to Visit  Medication Sig Dispense Refill  . allopurinol (ZYLOPRIM) 100 MG tablet TAKE 1 TABLET BY MOUTH ONCE DAILY 90 tablet 1  . b complex vitamins tablet Take 1 tablet by mouth daily.    . Cholecalciferol (VITAMIN D3) 1000 UNITS CAPS Take 1 Int'l Units by mouth daily.    . fluticasone (FLOVENT HFA) 220 MCG/ACT inhaler Inhale 1 puff into the lungs 2 (two) times daily. 1 Inhaler 3  . lisinopril (PRINIVIL,ZESTRIL) 10 MG  tablet Take 1 tablet (10 mg total) by mouth daily. 30 tablet 6  . simvastatin (ZOCOR) 20 MG tablet TAKE 1 TABLET BY MOUTH AT BEDTIME. 90 tablet 3  . warfarin (COUMADIN) 5 MG tablet TAKE AS DIRECTED BY MOUTH BY COUMADIN CLINIC 120 tablet 1   Current Facility-Administered Medications on File Prior to Visit  Medication Dose Route Frequency Provider Last Rate Last Dose  . 0.9 %  sodium chloride infusion  500 mL Intravenous Continuous Irene Shipper, MD        Past Medical History:  Diagnosis Date  . Anxiety   . Atherosclerosis of abdominal aorta ([place])  11/12/2011  . BPH (benign prostatic hyperplasia)   . Cancer of prostate ([place])   . Chronic anticoagulation 09/12/2016  . Diverticulosis of colon   . DJD (degenerative joint disease)    left foot, "pt denies"  . Glucose intolerance (impaired glucose tolerance) 11/03/2010  . History of colon polyps   . History of DVT (deep vein thrombosis)   . HTN (hypertension)   . Hypercholesteremia   . Lumbar degenerative disc disease 11/12/2011  . Plantar fasciitis     Past Surgical History:  Procedure Laterality Date  . INSERTION PROSTATE RADIATION SEED    . UVULECTOMY      Social History   Socioeconomic History  . Marital status: Married    Spouse name: None  . Number of children: 2  . Years of education: None  . Highest education level: None  Social Needs  . Financial resource strain: None  . Food insecurity - worry: None  . Food insecurity - inability: None  . Transportation needs - medical: None  . Transportation needs - non-medical: None  Occupational History  . Occupation: retired  Tobacco Use  . Smoking status: Former Research scientist (life sciences)  . Smokeless tobacco: Never Used  Substance and Sexual Activity  . Alcohol use: No  . Drug use: No  . Sexual activity: Not Currently  Other Topics Concern  . None  Social History Narrative  . None    Family History  Problem Relation Age of Onset  . Deep vein thrombosis Mother   . Diabetes Mother   . Healthy Brother   . Healthy Brother   . Colon cancer Neg Hx   . Rectal cancer Neg Hx     Review of Systems  Constitutional: Negative for chills and fever.  Respiratory: Negative for shortness of breath.   Cardiovascular: Positive for leg swelling (chronic, unchanged). Negative for chest pain and palpitations.  Musculoskeletal: Positive for myalgias. Negative for back pain.  Skin: Negative for color change.  Neurological: Negative for weakness and numbness.       Objective:   Vitals:   03/25/17 1325  BP: 120/66  Pulse: 74  Resp: 16    Temp: 98.3 F (36.8 C)  SpO2: 98%   Wt Readings from Last 3 Encounters:  03/25/17 204 lb (92.5 kg)  03/05/17 205 lb 12.8 oz (93.4 kg)  01/25/17 207 lb 9.6 oz (94.2 kg)   Body mass index is 30.13 kg/m.   Physical Exam  Constitutional: He appears well-developed and well-nourished. No distress.  Cardiovascular: Normal rate, regular rhythm and normal heart sounds.  Small varicose veins in right ankle  Pulmonary/Chest: Effort normal and breath sounds normal. No respiratory distress. He has no wheezes. He has no rales.  Musculoskeletal: He exhibits no edema.  No lower back pain with tenderness  Skin: Skin is warm and dry. No rash noted. He is not  diaphoretic. No erythema.           Assessment & Plan:    See Problem List for Assessment and Plan of chronic medical problems.

## 2017-03-25 ENCOUNTER — Ambulatory Visit (HOSPITAL_COMMUNITY)
Admission: RE | Admit: 2017-03-25 | Discharge: 2017-03-25 | Disposition: A | Discharge status: Home / Self Care | Payer: Medicare Other | Source: Ambulatory Visit | Attending: Cardiovascular Disease | Admitting: Cardiovascular Disease

## 2017-03-25 ENCOUNTER — Encounter: Payer: Self-pay | Admitting: Internal Medicine

## 2017-03-25 ENCOUNTER — Telehealth: Payer: Self-pay | Admitting: Internal Medicine

## 2017-03-25 ENCOUNTER — Ambulatory Visit (INDEPENDENT_AMBULATORY_CARE_PROVIDER_SITE_OTHER): Payer: Medicare Other | Admitting: Internal Medicine

## 2017-03-25 VITALS — BP 120/66 | HR 74 | Temp 98.3°F | Resp 16 | Wt 204.0 lb

## 2017-03-25 DIAGNOSIS — Z86718 Personal history of other venous thrombosis and embolism: Secondary | ICD-10-CM | POA: Diagnosis not present

## 2017-03-25 DIAGNOSIS — I82411 Acute embolism and thrombosis of right femoral vein: Secondary | ICD-10-CM | POA: Insufficient documentation

## 2017-03-25 DIAGNOSIS — Z720 Tobacco use: Secondary | ICD-10-CM | POA: Diagnosis not present

## 2017-03-25 DIAGNOSIS — M79604 Pain in right leg: Secondary | ICD-10-CM

## 2017-03-25 MED ORDER — BUPROPION HCL ER (SR) 150 MG PO TB12: 150.0000 mg | ORAL_TABLET | Freq: Two times a day (BID) | ORAL | 5 refills | Status: DC

## 2017-03-25 NOTE — Patient Instructions (Addendum)
A doppler ultrasound was ordered of your right leg to rule out a blood clot.    If this is negative we will consider you seeing a vascular specialist or Dr Tamala Julian for further evaluation.   Medications reviewed and updated.  Changes include starting wellbutrin for smoking cessation.

## 2017-03-25 NOTE — Assessment & Plan Note (Signed)
Here complaining of right upper leg pain for more than 2 weeks, worse in the past 2 weeks Pain is worse with sitting or lying.  Has mild soreness with walking, but has increased pain with standing still History of DVT on warfarin.  No increase in chronic leg edema.  Wears support hose daily Need to rule out DVT despite warfarin therapy.  Last INR 1.9.  Will order a stat vascular Doppler of right lower extremity If negative symptoms may be related to vascular disease and will consider vascular referral for radiculopathy despite no back pain and may consider referral to Dr. Tamala Julian who he has seen in the past

## 2017-03-25 NOTE — Telephone Encounter (Signed)
The ultrasound of his right lower extremity does show a blood clot, but this appears to be chronic and not new.  He should continue his warfarin at his current dose.  I think he would benefit from seeing a vascular specialist to see if some of his symptoms are related to his veins.  Let me know if he agrees and I will order the referral.

## 2017-03-25 NOTE — Assessment & Plan Note (Signed)
The patient was counseled on the dangers of tobacco use, and eager to quit.  5 minutes spent discussed his smoking and reviewing strategies to maximize success, including pharmacotherapy (wellbutrin).   Smokes minimal but wants to quit - has not been able to. Using nicotine gum  Has never tried medication Smoking 2 cigs a day at most --- 1 pack lasts almost 2 years Discussed options - patch would likely be too strong Discussed medication options - will try wellbutrin - sent to pharmacy

## 2017-03-26 NOTE — Telephone Encounter (Signed)
Spoke with pt to give results. Pt is okay with referral being placed.

## 2017-03-27 ENCOUNTER — Telehealth: Payer: Self-pay | Admitting: Internal Medicine

## 2017-03-27 DIAGNOSIS — M79604 Pain in right leg: Secondary | ICD-10-CM

## 2017-03-27 NOTE — Telephone Encounter (Signed)
He mentioned to me that is swelling is unchanged - his chronic swelling is from the blood clot in his leg.  I do not recall him mentioning any dark spots.  Since nothing has changed with the blood vessels there is not much that vascular can do.  Some of his pain may be related to muscular skeletal cause, which is what Dr. Tamala Julian will evaluate for.

## 2017-03-27 NOTE — Telephone Encounter (Signed)
Let him know vascular surgery can not do anything for his leg since the blood clot is chronic and not occluding the vein.  I would like him to see Dr Tamala Julian to evaluate if the leg pain is musculoskeletal.  Please schedule - right leg pain.    Referral ordered.

## 2017-03-27 NOTE — Telephone Encounter (Addendum)
Pt returned call and is scheduled to see Dr Tamala Julian on 04/02/2017 at 2:15pm (first available).  He was still wanting some clarification as to why he is having the swelling and dark spots. I told him that this is what Dr Tamala Julian would evaluate and that vascular surgery would not be able to do anything for him. Is there anything he should do in the mean time? It sounded like it was still a bit concerned.

## 2017-03-27 NOTE — Telephone Encounter (Addendum)
Called patient and spoke to his wife. She will have him call back to schedule. CRM entered for [facility] to connect pt to [place] to schedule. Per Ria Comment and Dr. Tamala Julian okay to schedule this appointment in a 15 minute slot.

## 2017-03-28 ENCOUNTER — Telehealth: Payer: Self-pay | Admitting: Internal Medicine

## 2017-03-28 NOTE — Telephone Encounter (Signed)
Will route to PharmD to inform and to determine any potential interaction.  Pt is followed in our coumadin clinic.

## 2017-03-28 NOTE — Telephone Encounter (Signed)
There is no interaction between bupropion and warfarin. Pt is aware and had no further questions.

## 2017-03-28 NOTE — Telephone Encounter (Signed)
Spoke with pt to inform.  

## 2017-03-28 NOTE — Telephone Encounter (Signed)
Mr. Garfield is calling because he has a new medication(Bupropion)  to take and wants to make sure he can take this with his Warfarin . Please call

## 2017-04-01 NOTE — Progress Notes (Signed)
[name] [address], [place] [address] Phone: [phone] Subjective:    I'm seeing this patient by the request  of:    CC: Right leg pain  [id number]  Kevin Hale is a 77 y.o. male coming in with complaint of right leg pain.  Patient has been doing this for quite some time.  Recently sent for an ABI.  TBI independently visualized by me and did not show any significant changes.  On Coumadin but was sent for a Doppler secondary to the vascular nature of his complaint.  ABI showed chronic nonocclusive changes Onset- Chronic Location- Medial Duration-states it seems to get worse with activity.  Hurts him in the time. Character- sore  Aggravating factors- sitting Reliving factors-somewhat better though with activity but worse with rest.  States that he has been walking on a treadmill which is improved. Therapies tried-  Severity-7 out of 10 and can get worse sometimes.     Past Medical History:  Diagnosis Date  . Anxiety   . Atherosclerosis of abdominal aorta ([place]) 11/12/2011  . BPH (benign prostatic hyperplasia)   . Cancer of prostate ([place])   . Chronic anticoagulation 09/12/2016  . Diverticulosis of colon   . DJD (degenerative joint disease)    left foot, "pt denies"  . Glucose intolerance (impaired glucose tolerance) 11/03/2010  . History of colon polyps   . History of DVT (deep vein thrombosis)   . HTN (hypertension)   . Hypercholesteremia   . Lumbar degenerative disc disease 11/12/2011  . Plantar fasciitis    Past Surgical History:  Procedure Laterality Date  . INSERTION PROSTATE RADIATION SEED    . UVULECTOMY     Social History   Socioeconomic History  . Marital status: Married    Spouse name: Not on file  . Number of children: 2  . Years of education: Not on file  . Highest education level: Not on file  Social Needs  . Financial resource strain: Not on file  . Food insecurity - worry: Not on file  . Food insecurity  - inability: Not on file  . Transportation needs - medical: Not on file  . Transportation needs - non-medical: Not on file  Occupational History  . Occupation: retired  Tobacco Use  . Smoking status: Former Research scientist (life sciences)  . Smokeless tobacco: Never Used  Substance and Sexual Activity  . Alcohol use: No  . Drug use: No  . Sexual activity: Not Currently  Other Topics Concern  . Not on file  Social History Narrative  . Not on file   Allergies  Allergen Reactions  . Pravastatin Other (See Comments)    myalgia   Family History  Problem Relation Age of Onset  . Deep vein thrombosis Mother   . Diabetes Mother   . Healthy Brother   . Healthy Brother   . Colon cancer Neg Hx   . Rectal cancer Neg Hx      Past medical history, social, surgical and family history all reviewed in electronic medical record.  No pertanent information unless stated regarding to the chief complaint.   Review of Systems:Review of systems updated and as accurate as of 04/01/17  No headache, visual changes, nausea, vomiting, diarrhea, constipation, dizziness, abdominal pain, skin rash, fevers, chills, night sweats, weight loss, swollen lymph nodes, , chest pain, shortness of breath, mood changes.  Positive swelling, muscle aches, body aches  Objective  There were no vitals taken for this visit. Systems  examined below as of 04/01/17   General: No apparent distress alert and oriented x3 mood and affect normal, dressed appropriately.  HEENT: Pupils equal, extraocular movements intact  Respiratory: Patient's speak in full sentences and does not appear short of breath  Cardiovascular: Plus lower extremity edema, tender, no erythema  Skin: Warm dry intact with no signs of infection or rash on extremities or on axial skeleton.  Patient's right leg bone does show some varicosis significantly on the lateral and dorsal aspect of the foot and ankle.  Good capillary refill.  Distal pulses are felt are brisk on the right  side. Abdomen: Soft nontender  Neuro: Cranial nerves II through XII are intact, neurovascularly intact in all extremities with 2+ DTRs and 2+ pulses.  Lymph: No lymphadenopathy of posterior or anterior cervical chain or axillae bilaterally.  Gait normal with good balance and coordination.  MSK:  Non tender with full range of motion and good stability and symmetric strength and tone of shoulders, elbows, wrist, hip, knee and ankles bilaterally.  Changes of multiple joints    Impression and Recommendations:     This case required medical decision making of moderate complexity.      Note: This dictation was prepared with Dragon dictation along with smaller phrase technology. Any transcriptional errors that result from this process are unintentional.

## 2017-04-02 ENCOUNTER — Ambulatory Visit: Payer: Medicare Other | Admitting: Family Medicine

## 2017-04-02 ENCOUNTER — Ambulatory Visit (INDEPENDENT_AMBULATORY_CARE_PROVIDER_SITE_OTHER): Payer: Medicare Other | Admitting: *Deleted

## 2017-04-02 ENCOUNTER — Encounter: Payer: Self-pay | Admitting: Family Medicine

## 2017-04-02 DIAGNOSIS — Z7901 Long term (current) use of anticoagulants: Secondary | ICD-10-CM | POA: Diagnosis not present

## 2017-04-02 DIAGNOSIS — I2699 Other pulmonary embolism without acute cor pulmonale: Secondary | ICD-10-CM

## 2017-04-02 DIAGNOSIS — M79604 Pain in right leg: Secondary | ICD-10-CM

## 2017-04-02 DIAGNOSIS — Z5181 Encounter for therapeutic drug level monitoring: Secondary | ICD-10-CM | POA: Diagnosis not present

## 2017-04-02 DIAGNOSIS — I82409 Acute embolism and thrombosis of unspecified deep veins of unspecified lower extremity: Secondary | ICD-10-CM | POA: Diagnosis not present

## 2017-04-02 LAB — POCT INR: INR: 2.1

## 2017-04-02 MED ORDER — CILOSTAZOL 50 MG PO TABS: 25.0000 mg | ORAL_TABLET | Freq: Two times a day (BID) | ORAL | 3 refills | Status: DC

## 2017-04-02 NOTE — Patient Instructions (Signed)
Good to see you  I want you to try pletal 1/2 tab 2 times a day this could help with blood flow a little.  Continue the compression socks.  Vitamin D 2000 IU daily  Tart cherry extract any dose at night to help as well.  See me again in 3 weeks and lets see how you are doing. If not a lot improvement then lets consider labs and xrays and maybe other medicines.

## 2017-04-02 NOTE — Patient Instructions (Addendum)
Description   Today take an extra 1/2 tablet, then start taking  1 tablet everyday except 1.5 tablets on Sundays, Tuesdays  and Thursdays. Recheck INR in 2 weeks. Main#[phone]

## 2017-04-02 NOTE — Assessment & Plan Note (Signed)
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  Patient is unable to get great blood flow.  Does seem to improve with activity though.  This is helpful.  We will try a low dose of Pletal and warned of potential side effects.  Hopefully this will be beneficial to help for some mild vasodilation.  In addition to this we discussed the possibility of neurologic aspect of it or even a stress fracture.  Patient wanted to hold on other testing at this moment.  If no significant change patient will consider this.  In the interim encourage patient to continue the compression stockings patient is in agreement with the plan.

## 2017-04-03 ENCOUNTER — Telehealth: Payer: Self-pay | Admitting: Internal Medicine

## 2017-04-03 NOTE — Telephone Encounter (Signed)
New message     Pt calling to find out if he can take a new medication that he was given by Dr. Hulan Saas. Cilostazol 50mg .  1\2 a pill 2 times a day. Call him at [phone]

## 2017-04-03 NOTE — Telephone Encounter (Signed)
Pletal does have antiplatelet properties and in combination with the warfarin can increase bleeding/bruising. Ok to use together just need to watch for any changes in bleeding or bruising. Otherwise there are no other drug interactions with his cardiac medications.

## 2017-04-03 NOTE — Telephone Encounter (Signed)
Reviewed comments by Baldo Daub Can try  Watch for increased brusing  Continue to follow periodic CBCs

## 2017-04-03 NOTE — Telephone Encounter (Signed)
Will route to Dr. Harrington Challenger and Pharm D to inform/for recommendations.

## 2017-04-04 ENCOUNTER — Ambulatory Visit: Payer: Medicare Other | Admitting: Family Medicine

## 2017-04-04 NOTE — Telephone Encounter (Signed)
Perfect  Will plan on CBC at follow up  Thank you for the communication! Hope all is well

## 2017-04-05 ENCOUNTER — Telehealth: Payer: Self-pay | Admitting: *Deleted

## 2017-04-05 NOTE — Telephone Encounter (Signed)
Spoke with pt and he states has been ordered Pletal 50 mg 1/2 tablet BID and is taking Vitamin D and is taking Tart Cherry Extract 1 daily Spoke with [place] PharmD and she states these medication do not interact with coumadin and this message given to pt with verbalization of understanding

## 2017-04-05 NOTE — Telephone Encounter (Signed)
Pt called to inquire about taking Cherry Tart Extract. Advised pt that the medication is safe to take while on Warfarin/Coumadin & he verbalized understanding.

## 2017-04-16 ENCOUNTER — Ambulatory Visit (INDEPENDENT_AMBULATORY_CARE_PROVIDER_SITE_OTHER): Payer: Medicare Other | Admitting: *Deleted

## 2017-04-16 DIAGNOSIS — I2699 Other pulmonary embolism without acute cor pulmonale: Secondary | ICD-10-CM | POA: Diagnosis not present

## 2017-04-16 DIAGNOSIS — Z86718 Personal history of other venous thrombosis and embolism: Secondary | ICD-10-CM | POA: Diagnosis not present

## 2017-04-16 DIAGNOSIS — Z5181 Encounter for therapeutic drug level monitoring: Secondary | ICD-10-CM

## 2017-04-16 DIAGNOSIS — I82409 Acute embolism and thrombosis of unspecified deep veins of unspecified lower extremity: Secondary | ICD-10-CM | POA: Diagnosis not present

## 2017-04-16 LAB — POCT INR: INR: 1.9

## 2017-04-16 NOTE — Patient Instructions (Signed)
Description   When you get home today Feb 19th take another 1/2 tablet  of coumadin then continue  taking  1 tablet everyday except 1.5 tablets on Sundays, Tuesdays  and Thursdays. Recheck INR in 2 weeks. Main#[phone] coumadin clinic [phone]

## 2017-04-23 NOTE — Progress Notes (Signed)
[facility] [address], [place] [address] Phone: [phone] Subjective:    I'm seeing this patient by the request  of:    CC: Right leg pain  [id number]  Kevin Hale is a 77 y.o. male coming in with complaint of right leg pain.  Seem to be more vascular.  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  Started on Pletal April 02, 2017.  Patient states  Patient has had previous x-rays over the course the last 2 years of bilateral hips and lumbar spine only showed mild osteoarthritic changes of both.  This was independently visualized by me.    Past Medical History:  Diagnosis Date  . Anxiety   . Atherosclerosis of abdominal aorta ([place]) 11/12/2011  . BPH (benign prostatic hyperplasia)   . Cancer of prostate ([place])   . Chronic anticoagulation 09/12/2016  . Diverticulosis of colon   . DJD (degenerative joint disease)    left foot, "pt denies"  . Glucose intolerance (impaired glucose tolerance) 11/03/2010  . History of colon polyps   . History of DVT (deep vein thrombosis)   . HTN (hypertension)   . Hypercholesteremia   . Lumbar degenerative disc disease 11/12/2011  . Plantar fasciitis    Past Surgical History:  Procedure Laterality Date  . INSERTION PROSTATE RADIATION SEED    . UVULECTOMY     Social History   Socioeconomic History  . Marital status: Married    Spouse name: None  . Number of children: 2  . Years of education: None  . Highest education level: None  Social Needs  . Financial resource strain: None  . Food insecurity - worry: None  . Food insecurity - inability: None  . Transportation needs - medical: None  . Transportation needs - non-medical: None  Occupational History  . Occupation: retired  Tobacco Use  . Smoking status: Former Research scientist (life sciences)  . Smokeless tobacco: Never Used  Substance and Sexual Activity  . Alcohol use: No  . Drug use: No  . Sexual activity: Not Currently    Other Topics Concern  . None  Social History Narrative  . None   Allergies  Allergen Reactions  . Pravastatin Other (See Comments)    myalgia   Family History  Problem Relation Age of Onset  . Deep vein thrombosis Mother   . Diabetes Mother   . Healthy Brother   . Healthy Brother   . Colon cancer Neg Hx   . Rectal cancer Neg Hx      Past medical history, social, surgical and family history all reviewed in electronic medical record.  No pertanent information unless stated regarding to the chief complaint.   Review of Systems:Review of systems updated and as accurate as of 04/24/17  No headache, visual changes, nausea, vomiting, diarrhea, constipation, dizziness, abdominal pain, skin rash, fevers, chills, night sweats, weight loss, swollen lymph nodes, body aches, joint swelling chest pain, shortness of breath, mood changes. Positive muscle aches.   Objective  Blood pressure (!) 148/72, pulse 91, height 5\' 9"  (1.753 m), weight 203 lb (92.1 kg), SpO2 98 %. Systems examined below as of 04/24/17   General: No apparent distress alert and oriented x3 mood and affect normal, dressed appropriately.  HEENT: Pupils equal, extraocular movements intact  Respiratory: Patient's speak in full sentences and does not appear short of breath  Cardiovascular: Trace lower extremity edema, non tender, no erythema  Skin: Warm dry  intact with no signs of infection or rash on extremities or on axial skeleton.  Abdomen: Soft nontender  Neuro: Cranial nerves II through XII are intact, neurovascularly intact in all extremities with 2+ DTRs and 2+ pulses.  Lymph: No lymphadenopathy of posterior or anterior cervical chain or axillae bilaterally.  Gait normal with good balance and coordination.  MSK:  Non tender with full range of motion and good stability and symmetric strength and tone of shoulders, elbows, wrist, hip, knee and ankles bilaterally.  Mild to moderate arthritic changes of multiple  joints. Less pain on palpation from previous exam of the upper thigh.  Negative straight leg test.  Seems to be neurovascularly intact distally.     Impression and Recommendations:     This case required medical decision making of moderate complexity.      Note: This dictation was prepared with Dragon dictation along with smaller phrase technology. Any transcriptional errors that result from this process are unintentional.

## 2017-04-24 ENCOUNTER — Encounter: Payer: Self-pay | Admitting: Family Medicine

## 2017-04-24 ENCOUNTER — Ambulatory Visit: Payer: Medicare Other | Admitting: Family Medicine

## 2017-04-24 DIAGNOSIS — M79604 Pain in right leg: Secondary | ICD-10-CM

## 2017-04-24 MED ORDER — CILOSTAZOL 50 MG PO TABS: 50.0000 mg | ORAL_TABLET | Freq: Two times a day (BID) | ORAL | 1 refills | Status: DC

## 2017-04-24 NOTE — Patient Instructions (Signed)
You are doing great  Ice is your friend.  Stay active.  Lets increase pletal to 50 mg 2 times a week  Continue the vitmains See me again in 6 weeks!

## 2017-04-24 NOTE — Assessment & Plan Note (Signed)
Patient does have more of a vascular component.  Responding well to the Pletal, states that he is 75% better.  Increase dosing to 50 mg twice daily.  Warned of potential side effects.  Has already checked with no interactions of any of the other medications but will watch for any type of bruising with patient being on a blood thinner.  Patient will follow up with me again in 6 weeks for further evaluation.

## 2017-04-30 ENCOUNTER — Ambulatory Visit (INDEPENDENT_AMBULATORY_CARE_PROVIDER_SITE_OTHER): Payer: Medicare Other | Admitting: *Deleted

## 2017-04-30 DIAGNOSIS — Z5181 Encounter for therapeutic drug level monitoring: Secondary | ICD-10-CM

## 2017-04-30 DIAGNOSIS — Z86718 Personal history of other venous thrombosis and embolism: Secondary | ICD-10-CM

## 2017-04-30 DIAGNOSIS — I2699 Other pulmonary embolism without acute cor pulmonale: Secondary | ICD-10-CM | POA: Diagnosis not present

## 2017-04-30 DIAGNOSIS — I82409 Acute embolism and thrombosis of unspecified deep veins of unspecified lower extremity: Secondary | ICD-10-CM

## 2017-04-30 LAB — POCT INR: INR: 1.9

## 2017-04-30 NOTE — Patient Instructions (Signed)
Description   When you get home today March 5th  take another 1/2 tablet  of coumadin then change coumadin dose to 1 and1 /2 tablets (7.5mg ) daily except 1 tablet (5mg ) on Mondays Wednesdays and Fridays  Recheck INR in 2 weeks. Main#[phone] coumadin clinic [phone]

## 2017-05-14 ENCOUNTER — Telehealth: Payer: Self-pay | Admitting: Internal Medicine

## 2017-05-14 ENCOUNTER — Ambulatory Visit: Payer: Medicare Other | Admitting: *Deleted

## 2017-05-14 DIAGNOSIS — I82409 Acute embolism and thrombosis of unspecified deep veins of unspecified lower extremity: Secondary | ICD-10-CM

## 2017-05-14 DIAGNOSIS — I2699 Other pulmonary embolism without acute cor pulmonale: Secondary | ICD-10-CM

## 2017-05-14 DIAGNOSIS — Z5181 Encounter for therapeutic drug level monitoring: Secondary | ICD-10-CM

## 2017-05-14 LAB — POCT INR: INR: 2.1

## 2017-05-14 NOTE — Patient Instructions (Signed)
Description   Continue taking 1.5 tablets (7.5mg ) everyday except 1 tablet (5mg ) on Mondays, Wednesdays and Fridays.   Recheck INR in 2 weeks. Main#[phone] coumadin clinic [phone]

## 2017-05-14 NOTE — Telephone Encounter (Signed)
New message     Pt would like a call to talk about changing his medication-xarelto. Please call.

## 2017-05-14 NOTE — Telephone Encounter (Signed)
I am ok with switch   Will forward to anticoag clinic to inform and guide transition.

## 2017-05-14 NOTE — Telephone Encounter (Signed)
Left message to call back  

## 2017-05-14 NOTE — Telephone Encounter (Signed)
Patient requests Dr. Harrington Challenger review his records and allow him to switch to Xarelto if possible. He understands to continue Coumadin at this time and he will be called with her recommendations when available.

## 2017-05-15 NOTE — Telephone Encounter (Signed)
Spoke with patient and advised that we will plan to transition to Xarelto at upcoming INR check on 05/28/17. Pt in agreement with plan.

## 2017-05-28 ENCOUNTER — Ambulatory Visit (INDEPENDENT_AMBULATORY_CARE_PROVIDER_SITE_OTHER): Payer: Medicare Other | Admitting: *Deleted

## 2017-05-28 DIAGNOSIS — Z5181 Encounter for therapeutic drug level monitoring: Secondary | ICD-10-CM

## 2017-05-28 DIAGNOSIS — I82409 Acute embolism and thrombosis of unspecified deep veins of unspecified lower extremity: Secondary | ICD-10-CM | POA: Diagnosis not present

## 2017-05-28 DIAGNOSIS — I2699 Other pulmonary embolism without acute cor pulmonale: Secondary | ICD-10-CM | POA: Diagnosis not present

## 2017-05-28 LAB — POCT INR: INR: 2.1

## 2017-05-28 MED ORDER — RIVAROXABAN 20 MG PO TABS: 20.0000 mg | ORAL_TABLET | Freq: Every day | ORAL | 10 refills | Status: DC

## 2017-05-28 NOTE — Patient Instructions (Signed)
Description   Discontinue Coumadin. Start Xarelto today 20mg  once a day with largest meal of the day. Recheck in 4 weeks.  Main#[phone] coumadin clinic [phone]

## 2017-06-03 NOTE — Progress Notes (Signed)
[name] [address], [place] [address] Phone: [phone] Subjective:    CC: Leg pain  PYP:[id number]  Kevin Hale is a 77 y.o. male coming in with complaint of bilateral calf pain. He is still sore every day but the soreness is better than last visit. Patient does walk on the treadmill for 45 minutes each day.  Patient states that he seems to be doing well with the medication.  No increase in the pain to any point.  Feels like he continues to improve very slowly.  Past medical history is significant for a DVT that is chronic.  Recent switch to Xarelto     Past Medical History:  Diagnosis Date  . Anxiety   . Atherosclerosis of abdominal aorta ([place]) 11/12/2011  . BPH (benign prostatic hyperplasia)   . Cancer of prostate ([place])   . Chronic anticoagulation 09/12/2016  . Diverticulosis of colon   . DJD (degenerative joint disease)    left foot, "pt denies"  . Glucose intolerance (impaired glucose tolerance) 11/03/2010  . History of colon polyps   . History of DVT (deep vein thrombosis)   . HTN (hypertension)   . Hypercholesteremia   . Lumbar degenerative disc disease 11/12/2011  . Plantar fasciitis    Past Surgical History:  Procedure Laterality Date  . INSERTION PROSTATE RADIATION SEED    . UVULECTOMY     Social History   Socioeconomic History  . Marital status: Married    Spouse name: Not on file  . Number of children: 2  . Years of education: Not on file  . Highest education level: Not on file  Occupational History  . Occupation: retired  Scientific laboratory technician  . Financial resource strain: Not on file  . Food insecurity:    Worry: Not on file    Inability: Not on file  . Transportation needs:    Medical: Not on file    Non-medical: Not on file  Tobacco Use  . Smoking status: Former Research scientist (life sciences)  . Smokeless tobacco: Never Used  Substance and Sexual Activity  . Alcohol use: No  . Drug use: No  . Sexual activity: Not Currently    Lifestyle  . Physical activity:    Days per week: Not on file    Minutes per session: Not on file  . Stress: Not on file  Relationships  . Social connections:    Talks on phone: Not on file    Gets together: Not on file    Attends religious service: Not on file    Active member of club or organization: Not on file    Attends meetings of clubs or organizations: Not on file    Relationship status: Not on file  Other Topics Concern  . Not on file  Social History Narrative  . Not on file   Allergies  Allergen Reactions  . Pravastatin Other (See Comments)    myalgia   Family History  Problem Relation Age of Onset  . Deep vein thrombosis Mother   . Diabetes Mother   . Healthy Brother   . Healthy Brother   . Colon cancer Neg Hx   . Rectal cancer Neg Hx      Past medical history, social, surgical and family history all reviewed in electronic medical record.  No pertanent information unless stated regarding to the chief complaint.   Review of Systems:Review of systems updated and as  No headache, visual changes, nausea, vomiting, diarrhea, constipation, dizziness,  abdominal pain, skin rash, fevers, chills, night sweats, weight loss, swollen lymph nodes, body aches, joint swelling,  chest pain, shortness of breath, mood changes.  Positive muscle aches  Objective  Blood pressure 136/70, pulse 89, height 5\' 9"  (1.753 m), weight 203 lb (92.1 kg), SpO2 98 %.   General: No apparent distress alert and oriented x3 mood and affect normal, dressed appropriately.  HEENT: Pupils equal, extraocular movements intact  Respiratory: Patient's speak in full sentences and does not appear short of breath  Cardiovascular: Trace lower extremity edema, non tender, no erythema  Skin: Warm dry intact with no signs of infection or rash on extremities or on axial skeleton.  Abdomen: Soft nontender  Neuro: Cranial nerves II through XII are intact, neurovascularly intact in all extremities with 2+ DTRs  and 2+ pulses.  Lymph: No lymphadenopathy of posterior or anterior cervical chain or axillae bilaterally.  Gait normal with good balance and coordination.  Improved from previous exam MSK: Mild tender with near full range of motion and good stability and symmetric strength and tone of shoulders, elbows, wrist, hip, knee and ankles bilaterally.  Arthritic changes noted Leg exam shows minimal tenderness to palpation of the calves bilaterally.  Good distal pulses noted today.     Impression and Recommendations:     This case required medical decision making of moderate complexity.      Note: This dictation was prepared with Dragon dictation along with smaller phrase technology. Any transcriptional errors that result from this process are unintentional.

## 2017-06-04 ENCOUNTER — Ambulatory Visit: Payer: Medicare Other | Admitting: Family Medicine

## 2017-06-04 DIAGNOSIS — M79604 Pain in right leg: Secondary | ICD-10-CM | POA: Diagnosis not present

## 2017-06-04 NOTE — Patient Instructions (Signed)
Good to see you  I think you are doing well.  Try to stay hydrated when outside a long time.  Add 1/2 cup of Gatorade to your 1 cup of water when doing a lot of activity  I am impressed overall.  Continue the same regimen at this time See me again in 2-3 months

## 2017-06-05 ENCOUNTER — Encounter: Payer: Self-pay | Admitting: Family Medicine

## 2017-06-05 NOTE — Assessment & Plan Note (Signed)
I believe it is secondary to more of a peripheral vascular disease.  Has been doing well.  Continue the same medications and same treatment options.  Follow-up again in 2-3 months

## 2017-06-25 ENCOUNTER — Ambulatory Visit: Payer: Medicare Other | Admitting: *Deleted

## 2017-06-25 DIAGNOSIS — I2699 Other pulmonary embolism without acute cor pulmonale: Secondary | ICD-10-CM

## 2017-06-25 DIAGNOSIS — I82409 Acute embolism and thrombosis of unspecified deep veins of unspecified lower extremity: Secondary | ICD-10-CM

## 2017-06-25 DIAGNOSIS — Z5181 Encounter for therapeutic drug level monitoring: Secondary | ICD-10-CM | POA: Diagnosis not present

## 2017-06-25 LAB — BASIC METABOLIC PANEL
BUN / CREAT RATIO: 10 (ref 10–24)
BUN: 13 mg/dL (ref 8–27)
CO2: 23 mmol/L (ref 20–29)
CREATININE: 1.34 mg/dL — AB (ref 0.76–1.27)
Calcium: 9.5 mg/dL (ref 8.6–10.2)
Chloride: 103 mmol/L (ref 96–106)
GFR calc non Af Amer: 51 mL/min/{1.73_m2} — ABNORMAL LOW (ref >59)
GFR, EST AFRICAN AMERICAN: 59 mL/min/{1.73_m2} — AB (ref >59)
Glucose: 94 mg/dL (ref 65–99)
Potassium: 4.6 mmol/L (ref 3.5–5.2)
SODIUM: 138 mmol/L (ref 134–144)

## 2017-06-25 LAB — CBC
HEMATOCRIT: 46.3 % (ref 37.5–51.0)
Hemoglobin: 15.3 g/dL (ref 13.0–17.7)
MCH: 28.5 pg (ref 26.6–33.0)
MCHC: 33 g/dL (ref 31.5–35.7)
MCV: 86 fL (ref 79–97)
Platelets: 257 10*3/uL (ref 150–379)
RBC: 5.37 x10E6/uL (ref 4.14–5.80)
RDW: 12.9 % (ref 12.3–15.4)
WBC: 5.6 10*3/uL (ref 3.4–10.8)

## 2017-06-25 NOTE — Patient Instructions (Signed)
  A full discussion of the nature of anticoagulants has been carried out.  A benefit/risk analysis has been presented to the patient, so that they understand the justification for choosing anticoagulation with Xarelto at this time.  The need for compliance is stressed.  Pt is aware to take the medication once daily with the largest meal of the day.  Side effects of potential bleeding are discussed, including unusual colored urine or stools, coughing up blood or coffee ground emesis, nose bleeds or serious fall or head trauma.  Discussed signs and symptoms of stroke. The patient should avoid any OTC items containing aspirin or ibuprofen.  Avoid alcohol consumption.   Call if any signs of abnormal bleeding.  Discussed financial obligations and resolved any difficulty in obtaining medication.  Next lab test in 6 months

## 2017-06-25 NOTE — Progress Notes (Signed)
Pt was started on Xarelto 20mg  for Pulmonary embolism and DVT on 05/28/2017.    Reviewed patients medication list.  Pt is not currently on any combined P-gp and strong CYP3A4 inhibitors/inducers (ketoconazole, traconazole, ritonavir, carbamazepine, phenytoin, rifampin, St. John's wort).  Reviewed labs.  SCr 1.34, Weight 89.9Kg, CrCl- 25mL/min, age 77 yrs old .  Dose appropriate based on CrCl.   Hgb and HCT 15.3/46.3.   A full discussion of the nature of anticoagulants has been carried out.  A benefit/risk analysis has been presented to the patient, so that they understand the justification for choosing anticoagulation with Xarelto at this time.  The need for compliance is stressed.  Pt is aware to take the medication once daily with the largest meal of the day.  Side effects of potential bleeding are discussed, including unusual colored urine or stools, coughing up blood or coffee ground emesis, nose bleeds or serious fall or head trauma.  Discussed signs and symptoms of stroke. The patient should avoid any OTC items containing aspirin or ibuprofen.  Avoid alcohol consumption.   Call if any signs of abnormal bleeding.  Discussed financial obligations and resolved any difficulty in obtaining medication.  Next lab test in 6 months.   06/26/17- Telephoned pt and reviewed labs and 3mth follow up scheduled. Pt aware to call for any bleeding issues or concerns, refills or questions.

## 2017-07-19 ENCOUNTER — Ambulatory Visit (INDEPENDENT_AMBULATORY_CARE_PROVIDER_SITE_OTHER): Payer: Medicare Other | Admitting: Internal Medicine

## 2017-07-19 ENCOUNTER — Encounter: Payer: Self-pay | Admitting: Internal Medicine

## 2017-07-19 ENCOUNTER — Telehealth: Payer: Self-pay | Admitting: Physician Assistant

## 2017-07-19 ENCOUNTER — Ambulatory Visit (INDEPENDENT_AMBULATORY_CARE_PROVIDER_SITE_OTHER)
Admission: RE | Admit: 2017-07-19 | Discharge: 2017-07-19 | Disposition: A | Discharge status: Home / Self Care | Payer: Medicare Other | Source: Ambulatory Visit | Attending: Internal Medicine | Admitting: Internal Medicine

## 2017-07-19 ENCOUNTER — Ambulatory Visit: Payer: Self-pay | Admitting: *Deleted

## 2017-07-19 VITALS — BP 130/82 | HR 82 | Wt 197.0 lb

## 2017-07-19 DIAGNOSIS — R062 Wheezing: Secondary | ICD-10-CM

## 2017-07-19 DIAGNOSIS — R6889 Other general symptoms and signs: Secondary | ICD-10-CM | POA: Diagnosis not present

## 2017-07-19 DIAGNOSIS — R079 Chest pain, unspecified: Secondary | ICD-10-CM | POA: Diagnosis not present

## 2017-07-19 DIAGNOSIS — R059 Cough, unspecified: Secondary | ICD-10-CM

## 2017-07-19 DIAGNOSIS — I10 Essential (primary) hypertension: Secondary | ICD-10-CM | POA: Diagnosis not present

## 2017-07-19 DIAGNOSIS — R7302 Impaired glucose tolerance (oral): Secondary | ICD-10-CM | POA: Diagnosis not present

## 2017-07-19 DIAGNOSIS — R05 Cough: Secondary | ICD-10-CM | POA: Diagnosis not present

## 2017-07-19 DIAGNOSIS — R0602 Shortness of breath: Secondary | ICD-10-CM | POA: Diagnosis not present

## 2017-07-19 LAB — POC INFLUENZA A&B (BINAX/QUICKVUE)
INFLUENZA A, POC: NEGATIVE
Influenza B, POC: NEGATIVE

## 2017-07-19 MED ORDER — METHYLPREDNISOLONE ACETATE 80 MG/ML IJ SUSP
80.0000 mg | Freq: Once | INTRAMUSCULAR | Status: AC
  Administered 2017-07-19: 80 mg via INTRAMUSCULAR

## 2017-07-19 MED ORDER — HYDROCODONE-HOMATROPINE 5-1.5 MG/5ML PO SYRP: 5.0000 mL | ORAL_SOLUTION | Freq: Four times a day (QID) | ORAL | 0 refills | Status: AC | PRN

## 2017-07-19 MED ORDER — PREDNISONE 10 MG PO TABS: ORAL_TABLET | ORAL | 0 refills | Status: DC

## 2017-07-19 MED ORDER — LEVOFLOXACIN 500 MG PO TABS: 500.0000 mg | ORAL_TABLET | Freq: Every day | ORAL | 0 refills | Status: AC

## 2017-07-19 NOTE — Patient Instructions (Addendum)
Your flu test was negative  You had the steroid shot today  Please take all new medication as prescribed - the antibiotic, cough medicine if needed, and prednisone  Please continue all other medications as before, and refills have been done if requested.  Please have the pharmacy call with any other refills you may need.  Please keep your appointments with your specialists as you may have planned  Please go to the XRAY Department in the Basement (go straight as you get off the elevator) for the x-ray testing  You will be contacted by phone if any changes need to be made immediately.  Otherwise, you will receive a letter about your results with an explanation, but please check with MyChart first.  Please remember to sign up for MyChart if you have not done so, as this will be important to you in the future with finding out test results, communicating by private email, and scheduling acute appointments online when needed.

## 2017-07-19 NOTE — Telephone Encounter (Signed)
No interaction with these medications and Xarelto. Pt advised and states understanding.

## 2017-07-19 NOTE — Telephone Encounter (Signed)
Called in c/o passing out yesterday and still feeling weak today.   Only complaint he has is blowing green stuff from his nose and feeling weak.  He was borderline needing to go to the ED or be seen in the PCP office per the protocol.  I spoke with the flow coordinator for Dr. Jenny Reichmann and he is willing to see him in the office today.  I made him an appt for today at 11:00.   Reason for Disposition . [1] Age > 50 years  AND [2] now alert and feels fine    Fainted yesterday.   Today still feeling weak.  Denies dizziness.  Answer Assessment - Initial Assessment Questions 1. ONSET: "How long were you unconscious?" (minutes) "When did it happen?"     I passed out yesterday.   I was waiting for my car, it was being worked on.   I went to the bathroom and fell to floor.   I have a cold.  Maybe that brought it on.     2. CONTENT: "What happened during period of unconsciousness?" (e.g., seizure activity)      I wasn't really out I was just so weak. 3. MENTAL STATUS: "Alert and oriented now?" (oriented x 3 = name, month, location)      Today I feel weak.     4. TRIGGER: "What do you think caused the fainting?" "What were you doing just before you fainted?"  (e.g., exercise, sudden standing up, prolonged standing)     I don't know why I fainted.   I was in the bathroom.   I'm taking Mucinex for a cold.   I started it yesterday.     I have a runny nose.   It's green stuff coming out.    Non productive cough.   No earache, fever, sore throat.  My back and shoulders are sore even before yesterday. 5. RECURRENT SYMPTOM: "Have you ever passed out before?" If so, ask: "When was the last time?" and "What happened that time?"      One time about 3 years ago when I had a bad cold.  Just like this time.  Last time I passed out I had bronchitis. 6. INJURY: "Did you sustain any injury during the fall?"      No 7. CARDIAC SYMPTOMS: "Have you had any of the following symptoms: chest pain, difficulty breathing,  palpitations?"     No 8. NEUROLOGIC SYMPTOMS: "Have you had any of the following symptoms: headache, numbness, vertigo, weakness?"     I just got weak and fell to floor.  None of above symptoms. 9. GI SYMPTOMS: "Have you had any of the following symptoms: abdominal pain, vomiting, diarrhea, blood in stools?"     No.   I'm eating and drinking fluids ok. 10. OTHER SYMPTOMS: "Do you have any other symptoms?"       This morning just feeling weak. 11. PREGNANCY: "Is there any chance you are pregnant?" "When was your last menstrual period?"       N/A  Protocols used: [facility]

## 2017-07-19 NOTE — Telephone Encounter (Signed)
New Message   Pt c/o medication issue:  1. Name of Medication: rivaroxaban (XARELTO) 20 MG TABS tablet HYDROcodone-homatropine (HYCODAN) 5-1.5 MG/5ML syrup predniSONE (DELTASONE) 10 MG tablet levofloxacin (LEVAQUIN) 500 MG tablet  2. How are you currently taking this medication (dosage and times per day)? Take 1 tablet (20 mg total) by mouth daily with supper  3. Are you having a reaction (difficulty breathing--STAT)? no  4. What is your medication issue? Pt wants to make sure its ok to take these medications together with the Xarelto

## 2017-07-19 NOTE — Progress Notes (Signed)
Subjective:    Patient ID: Kevin Hale, male    DOB: 05-04-1940, 77 y.o.   MRN: [id number]  HPI Here with acute onset mild to mod 2-3 days ST, HA, general weakness and malaise, with prod cough greenish sputum, but Pt denies chest pain, increased sob or doe, wheezing, orthopnea, PND, increased LE swelling, palpitations, or syncope, except for onset mild wheezing sob since last PM.  Also with weakness and dizziness and near fall yesterday.  Just feels terrible today.  No chills or falls.   Pt denies polydipsia, polyuria Past Medical History:  Diagnosis Date  . Anxiety   . Atherosclerosis of abdominal aorta ([place]) 11/12/2011  . BPH (benign prostatic hyperplasia)   . Cancer of prostate ([place])   . Chronic anticoagulation 09/12/2016  . Diverticulosis of colon   . DJD (degenerative joint disease)    left foot, "pt denies"  . Glucose intolerance (impaired glucose tolerance) 11/03/2010  . History of colon polyps   . History of DVT (deep vein thrombosis)   . HTN (hypertension)   . Hypercholesteremia   . Lumbar degenerative disc disease 11/12/2011  . Plantar fasciitis    Past Surgical History:  Procedure Laterality Date  . INSERTION PROSTATE RADIATION SEED    . UVULECTOMY      reports that he has quit smoking. He has never used smokeless tobacco. He reports that he does not drink alcohol or use drugs. family history includes Deep vein thrombosis in his mother; Diabetes in his mother; Healthy in his brother and brother. Allergies  Allergen Reactions  . Pravastatin Other (See Comments)    myalgia   Current Outpatient Medications on File Prior to Visit  Medication Sig Dispense Refill  . allopurinol (ZYLOPRIM) 100 MG tablet TAKE 1 TABLET BY MOUTH ONCE DAILY 90 tablet 1  . b complex vitamins tablet Take 1 tablet by mouth daily.    Marland Kitchen buPROPion (WELLBUTRIN SR) 150 MG 12 hr tablet Take 1 tablet (150 mg total) by mouth 2 (two) times daily. 60 tablet 5  . Cholecalciferol (VITAMIN D3) 1000 UNITS CAPS  Take 1 Int'l Units by mouth daily.    . cilostazol (PLETAL) 50 MG tablet Take 1 tablet (50 mg total) by mouth 2 (two) times daily. 180 tablet 1  . lisinopril (PRINIVIL,ZESTRIL) 10 MG tablet Take 1 tablet (10 mg total) by mouth daily. 30 tablet 6  . rivaroxaban (XARELTO) 20 MG TABS tablet Take 1 tablet (20 mg total) by mouth daily with supper. 30 tablet 10  . simvastatin (ZOCOR) 20 MG tablet TAKE 1 TABLET BY MOUTH AT BEDTIME. 90 tablet 3  . fluticasone (FLOVENT HFA) 220 MCG/ACT inhaler Inhale 1 puff into the lungs 2 (two) times daily. (Patient not taking: Reported on 07/19/2017) 1 Inhaler 3   No current facility-administered medications on file prior to visit.    Review of Systems  Constitutional: Negative for other unusual diaphoresis or sweats HENT: Negative for ear discharge or swelling Eyes: Negative for other worsening visual disturbances Respiratory: Negative for stridor or other swelling  Gastrointestinal: Negative for worsening distension or other blood Genitourinary: Negative for retention or other urinary change Musculoskeletal: Negative for other MSK pain or swelling Skin: Negative for color change or other new lesions Neurological: Negative for worsening tremors and other numbness  Psychiatric/Behavioral: Negative for worsening agitation or other fatigue All other system neg per pt    Objective:   Physical Exam BP 130/82 (BP Location: Left Arm, Patient Position: Sitting, Cuff Size: Normal)  Pulse 82   Wt 197 lb (89.4 kg)   SpO2 96%   BMI 29.09 kg/m  VS noted, mild to mod ill, weak appearing but no assist needed to stand and ambulate Constitutional: Pt appears in NAD HENT: Head: NCAT.  Right Ear: External ear normal.  Left Ear: External ear normal.  Eyes: . Pupils are equal, round, and reactive to light. Conjunctivae and EOM are normal Bilat tm's with mild erythema.  Max sinus areas mild tender.  Pharynx with mild erythema, no exudate Nose: without d/c or  deformity Neck: Neck supple. Gross normal ROM Cardiovascular: Normal rate and regular rhythm.   Pulmonary/Chest: Effort normal and breath sounds decreased without rales but with mild bilat wheezing.  Neurological: Pt is alert. At baseline orientation, motor grossly intact Skin: Skin is warm. No rashes, other new lesions, no LE edema Psychiatric: Pt behavior is normal without agitation  No other exam findings  Rapid flu - POCT - POC Influenza A&B (Binax test)   Ref Range & Units 13:12  Influenza A, POC Negative Negative   Influenza B, POC Negative Negative            Assessment & Plan:

## 2017-07-19 NOTE — Telephone Encounter (Signed)
I will route to Pharm-D for further advice.

## 2017-07-20 NOTE — Assessment & Plan Note (Addendum)
Mild to mod, c/w bronchitis vs pna, for cxr, for antibx course, cough med prn,  to f/u any worsening symptoms or concerns 

## 2017-07-20 NOTE — Assessment & Plan Note (Signed)
Mild to mod, for predpac asd,  depomedrol IM 80, to f/u any worsening symptoms or concerns

## 2017-07-20 NOTE — Assessment & Plan Note (Signed)
stable overall by history and exam, recent data reviewed with pt, and pt to continue medical treatment as before,  to f/u any worsening symptoms or concerns BP Readings from Last 3 Encounters:  07/19/17 130/82  06/04/17 136/70  04/24/17 (!) 148/72

## 2017-07-20 NOTE — Assessment & Plan Note (Signed)
stable overall by history and exam, recent data reviewed with pt, and pt to continue medical treatment as before,  to f/u any worsening symptoms or concerns Lab Results  Component Value Date   HGBA1C 5.3 09/13/2016

## 2017-08-05 ENCOUNTER — Ambulatory Visit (INDEPENDENT_AMBULATORY_CARE_PROVIDER_SITE_OTHER): Payer: Medicare Other | Admitting: Internal Medicine

## 2017-08-05 ENCOUNTER — Ambulatory Visit: Payer: Self-pay | Admitting: Internal Medicine

## 2017-08-05 ENCOUNTER — Encounter: Payer: Self-pay | Admitting: Internal Medicine

## 2017-08-05 VITALS — BP 138/84 | HR 97 | Temp 98.4°F | Ht 69.0 in | Wt 192.0 lb

## 2017-08-05 DIAGNOSIS — I10 Essential (primary) hypertension: Secondary | ICD-10-CM | POA: Diagnosis not present

## 2017-08-05 DIAGNOSIS — R0789 Other chest pain: Secondary | ICD-10-CM | POA: Diagnosis not present

## 2017-08-05 DIAGNOSIS — F418 Other specified anxiety disorders: Secondary | ICD-10-CM

## 2017-08-05 DIAGNOSIS — R079 Chest pain, unspecified: Secondary | ICD-10-CM | POA: Diagnosis not present

## 2017-08-05 NOTE — Progress Notes (Signed)
Subjective:    Patient ID: Kevin Hale, male    DOB: Apr 01, 1940, 77 y.o.   MRN: [id number]  HPI  Here with c/o persistent mid left parasternal chest wall pain tender sore area mild but persistent and just wondering why taking so long to resolve.  Pt denies other chest pain, increased sob or doe, wheezing, orthopnea, PND, increased LE swelling, palpitations, dizziness or syncope, except for sob with bending over to tie shoes.  Recent CXR may 24 neg for acute. Worse to twist about at the waist or reach with the left arm.   Pt denies fever, wt loss, night sweats, loss of appetite, or other constitutional symptoms  Has had mild worsening depressive symptoms with low mood, but no suicidal ideation, or panic; and declines any change in tx or referral  Has lost wt, not sure why Wt Readings from Last 3 Encounters:  08/05/17 192 lb (87.1 kg)  07/19/17 197 lb (89.4 kg)  06/04/17 203 lb (92.1 kg)   Past Medical History:  Diagnosis Date  . Anxiety   . Atherosclerosis of abdominal aorta ([place]) 11/12/2011  . BPH (benign prostatic hyperplasia)   . Cancer of prostate ([place])   . Chronic anticoagulation 09/12/2016  . Diverticulosis of colon   . DJD (degenerative joint disease)    left foot, "pt denies"  . Glucose intolerance (impaired glucose tolerance) 11/03/2010  . History of colon polyps   . History of DVT (deep vein thrombosis)   . HTN (hypertension)   . Hypercholesteremia   . Lumbar degenerative disc disease 11/12/2011  . Plantar fasciitis    Past Surgical History:  Procedure Laterality Date  . INSERTION PROSTATE RADIATION SEED    . UVULECTOMY      reports that he has quit smoking. He has never used smokeless tobacco. He reports that he does not drink alcohol or use drugs. family history includes Deep vein thrombosis in his mother; Diabetes in his mother; Healthy in his brother and brother. Allergies  Allergen Reactions  . Pravastatin Other (See Comments)    myalgia   Current Outpatient  Medications on File Prior to Visit  Medication Sig Dispense Refill  . allopurinol (ZYLOPRIM) 100 MG tablet TAKE 1 TABLET BY MOUTH ONCE DAILY 90 tablet 1  . b complex vitamins tablet Take 1 tablet by mouth daily.    Marland Kitchen buPROPion (WELLBUTRIN SR) 150 MG 12 hr tablet Take 1 tablet (150 mg total) by mouth 2 (two) times daily. 60 tablet 5  . Cholecalciferol (VITAMIN D3) 1000 UNITS CAPS Take 1 Int'l Units by mouth daily.    . cilostazol (PLETAL) 50 MG tablet Take 1 tablet (50 mg total) by mouth 2 (two) times daily. 180 tablet 1  . lisinopril (PRINIVIL,ZESTRIL) 10 MG tablet Take 1 tablet (10 mg total) by mouth daily. 30 tablet 6  . rivaroxaban (XARELTO) 20 MG TABS tablet Take 1 tablet (20 mg total) by mouth daily with supper. 30 tablet 10  . simvastatin (ZOCOR) 20 MG tablet TAKE 1 TABLET BY MOUTH AT BEDTIME. 90 tablet 3   No current facility-administered medications on file prior to visit.    Review of Systems  Constitutional: Negative for other unusual diaphoresis or sweats HENT: Negative for ear discharge or swelling Eyes: Negative for other worsening visual disturbances Respiratory: Negative for stridor or other swelling  Gastrointestinal: Negative for worsening distension or other blood Genitourinary: Negative for retention or other urinary change Musculoskeletal: Negative for other MSK pain or swelling Skin: Negative for color  change or other new lesions Neurological: Negative for worsening tremors and other numbness  Psychiatric/Behavioral: Negative for worsening agitation or other fatigue All other system neg per pt    Objective:   Physical Exam BP 138/84   Pulse 97   Temp 98.4 F (36.9 C) (Oral)   Ht 5\' 9"  (1.753 m)   Wt 192 lb (87.1 kg)   SpO2 97%   BMI 28.35 kg/m  VS noted,  Constitutional: Pt appears in NAD HENT: Head: NCAT.  Right Ear: External ear normal.  Left Ear: External ear normal.  Eyes: . Pupils are equal, round, and reactive to light. Conjunctivae and EOM are  normal Nose: without d/c or deformity Neck: Neck supple. Gross normal ROM Cardiovascular: Normal rate and regular rhythm.   Pulmonary/Chest: Effort normal and breath sounds without rales or wheezing.  Abd:  Soft, NT + BS Left mid parasternal sore tender area localized without rash or swelling Neurological: Pt is alert. At baseline orientation, motor grossly intact Skin: Skin is warm. No rashes, other new lesions, no LE edema Psychiatric: Pt behavior is normal without agitation , + depressed affect flat and blunted No other exam findings  ECG I have personally interpreted today SR, poor R wave progression 90      Assessment & Plan:

## 2017-08-05 NOTE — Assessment & Plan Note (Signed)
With worsening depressive symptoms but without SI or HI, declines add SSRI or referral

## 2017-08-05 NOTE — Patient Instructions (Signed)
OK to take Tylenol 650 mg every 8 hrs as needed for pain  Please continue all other medications as before, and refills have been done if requested.  Please have the pharmacy call with any other refills you may need.  Please continue your efforts at being more active, low cholesterol diet, and weight control.  Please keep your appointments with your specialists as you may have planned

## 2017-08-05 NOTE — Assessment & Plan Note (Signed)
C/w costochondritis persistent mild, d/w pt natural hx, would need to avoid nsaids on xarelto, for tramadol prn, declines f/u cxr

## 2017-08-05 NOTE — Assessment & Plan Note (Signed)
stable overall by history and exam, recent data reviewed with pt, and pt to continue medical treatment as before,  to f/u any worsening symptoms or concerns BP Readings from Last 3 Encounters:  08/05/17 138/84  07/19/17 130/82  06/04/17 136/70

## 2017-08-05 NOTE — Telephone Encounter (Signed)
Patient reports pain in his chest for over 1 month. Hurts when he moves or takes a deep breath.Patient reports the pain is sharp . Appointment made for today with Dr Jenny Reichmann patient advised to call back or call 911 if symptoms become wors guidelines given   Reason for Disposition . [1] Chest pain lasts > 5 minutes AND [2] occurred > 3 days ago (72 hours) AND [3] NO chest pain or cardiac symptoms now  Answer Assessment - Initial Assessment Questions 1. LOCATION: "Where does it hurt?"       l SIDE  CHEST   2. RADIATION: "Does the pain go anywhere else?" (e.g., into neck, jaw, arms, back)      NO 3. ONSET: "When did the chest pain begin?" (Minutes, hours or days)        1  MONTH AGO   4. PATTERN "Does the pain come and go, or has it been constant since it started?"  "Does it get worse with exertion?"       Constant   Gets worse  On moment  And when he touchs   5. DURATION: "How long does it last" (e.g., seconds, minutes, hours)         Secs   6. SEVERITY: "How bad is the pain?"  (e.g., Scale 1-10; mild, moderate, or severe)    - MILD (1-3): doesn't interfere with normal activities     - MODERATE (4-7): interferes with normal activities or awakens from sleep    - SEVERE (8-10): excruciating pain, unable to do any normal activities          Moderate   7. CARDIAC RISK FACTORS: "Do you have any history of heart problems or risk factors for heart disease?" (e.g., prior heart attack, angina; high blood pressure, diabetes, being overweight, high cholesterol, smoking, or strong family history of heart disease)        High cholesterol  -  Smokes  On rare occasions   8. PULMONARY RISK FACTORS: "Do you have any history of lung disease?"  (e.g., blood clots in lung, asthma, emphysema, birth control pills)         Blood clots   9. CAUSE: "What do you think is causing the chest pain?"          No  Idea    10. OTHER SYMPTOMS: "Do you have any other symptoms?" (e.g., dizziness, nausea, vomiting, sweating,  fever, difficulty breathing, cough)         COUGH   11. PREGNANCY: "Is there any chance you are pregnant?" "When was your last menstrual period?"       N/A  Protocols used: CHEST PAIN-A-AH

## 2017-08-20 NOTE — Progress Notes (Signed)
[name] [address], [place] [address] Phone: [phone] Subjective:      CC: leg pain.   [id number]  Kevin Hale is a 77 y.o. male coming in with complaint of leg pain.  Patient was found to have more of a vascular difficulty.  Patient was put on Pletal.  Since then is doing very well.  No cramping with activity, no side effects of medication, no longer had any nighttime pain.  Overall doing relatively well.       Past Medical History:  Diagnosis Date  . Anxiety   . Atherosclerosis of abdominal aorta ([place]) 11/12/2011  . BPH (benign prostatic hyperplasia)   . Cancer of prostate ([place])   . Chronic anticoagulation 09/12/2016  . Diverticulosis of colon   . DJD (degenerative joint disease)    left foot, "pt denies"  . Glucose intolerance (impaired glucose tolerance) 11/03/2010  . History of colon polyps   . History of DVT (deep vein thrombosis)   . HTN (hypertension)   . Hypercholesteremia   . Lumbar degenerative disc disease 11/12/2011  . Plantar fasciitis    Past Surgical History:  Procedure Laterality Date  . INSERTION PROSTATE RADIATION SEED    . UVULECTOMY     Social History   Socioeconomic History  . Marital status: Married    Spouse name: Not on file  . Number of children: 2  . Years of education: Not on file  . Highest education level: Not on file  Occupational History  . Occupation: retired  Scientific laboratory technician  . Financial resource strain: Not on file  . Food insecurity:    Worry: Not on file    Inability: Not on file  . Transportation needs:    Medical: Not on file    Non-medical: Not on file  Tobacco Use  . Smoking status: Former Research scientist (life sciences)  . Smokeless tobacco: Never Used  Substance and Sexual Activity  . Alcohol use: No  . Drug use: No  . Sexual activity: Not Currently  Lifestyle  . Physical activity:    Days per week: Not on file    Minutes per session: Not on file  . Stress: Not on file  Relationships  .  Social connections:    Talks on phone: Not on file    Gets together: Not on file    Attends religious service: Not on file    Active member of club or organization: Not on file    Attends meetings of clubs or organizations: Not on file    Relationship status: Not on file  Other Topics Concern  . Not on file  Social History Narrative  . Not on file   Allergies  Allergen Reactions  . Pravastatin Other (See Comments)    myalgia   Family History  Problem Relation Age of Onset  . Deep vein thrombosis Mother   . Diabetes Mother   . Healthy Brother   . Healthy Brother   . Colon cancer Neg Hx   . Rectal cancer Neg Hx      Past medical history, social, surgical and family history all reviewed in electronic medical record.  No pertanent information unless stated regarding to the chief complaint.   Review of Systems:Review of systems updated and as accurate as of 08/21/17  No headache, visual changes, nausea, vomiting, diarrhea, constipation, dizziness, abdominal pain, skin rash, fevers, chills, night sweats, weight loss, swollen lymph nodes, body aches, joint swelling, muscle aches, chest pain,  shortness of breath, mood changes.   Objective  Blood pressure 118/80, pulse 98, height 5\' 9"  (1.753 m), weight 194 lb (88 kg), SpO2 98 %. Systems examined below as of 08/21/17   General: No apparent distress alert and oriented x3 mood and affect normal, dressed appropriately.  HEENT: Pupils equal, extraocular movements intact  Respiratory: Patient's speak in full sentences and does not appear short of breath  Cardiovascular: No lower extremity edema, non tender, no erythema  Skin: Warm dry intact with no signs of infection or rash on extremities or on axial skeleton.  Abdomen: Soft nontender  Neuro: Cranial nerves II through XII are intact, neurovascularly intact in all extremities with 2+ DTRs and 2+ pulses.  Lymph: No lymphadenopathy of posterior or anterior cervical chain or axillae  bilaterally.  Gait normal with good balance and coordination.  MSK:  Non tender with full range of motion and good stability and symmetric strength and tone of shoulders, elbows, wrist, hip, knee and ankles bilaterally.  Mild arthritic changes of multiple joints    Impression and Recommendations:     This case required medical decision making of moderate complexity.      Note: This dictation was prepared with Dragon dictation along with smaller phrase technology. Any transcriptional errors that result from this process are unintentional.

## 2017-08-21 ENCOUNTER — Ambulatory Visit (INDEPENDENT_AMBULATORY_CARE_PROVIDER_SITE_OTHER): Payer: Medicare Other | Admitting: Family Medicine

## 2017-08-21 ENCOUNTER — Encounter: Payer: Self-pay | Admitting: Family Medicine

## 2017-08-21 DIAGNOSIS — R252 Cramp and spasm: Secondary | ICD-10-CM

## 2017-08-21 MED ORDER — CILOSTAZOL 50 MG PO TABS: 50.0000 mg | ORAL_TABLET | Freq: Two times a day (BID) | ORAL | 3 refills | Status: DC

## 2017-08-21 NOTE — Assessment & Plan Note (Signed)
Seems to be vascular in nature.  Doing very well.  No change in management same medications year supply given.  Follow-up as needed

## 2017-08-21 NOTE — Patient Instructions (Signed)
Good to see you  Continue the pletal  Stay active.  See me when you need me

## 2017-09-10 ENCOUNTER — Other Ambulatory Visit: Payer: Self-pay | Admitting: Internal Medicine

## 2017-09-10 DIAGNOSIS — I10 Essential (primary) hypertension: Secondary | ICD-10-CM

## 2017-09-10 DIAGNOSIS — R0602 Shortness of breath: Secondary | ICD-10-CM

## 2017-09-13 ENCOUNTER — Encounter: Payer: Medicare Other | Admitting: Internal Medicine

## 2017-09-19 ENCOUNTER — Ambulatory Visit (INDEPENDENT_AMBULATORY_CARE_PROVIDER_SITE_OTHER): Payer: Medicare Other | Admitting: Internal Medicine

## 2017-09-19 ENCOUNTER — Telehealth: Payer: Self-pay

## 2017-09-19 ENCOUNTER — Encounter: Payer: Self-pay | Admitting: Internal Medicine

## 2017-09-19 ENCOUNTER — Other Ambulatory Visit (INDEPENDENT_AMBULATORY_CARE_PROVIDER_SITE_OTHER): Payer: Medicare Other

## 2017-09-19 VITALS — BP 122/78 | HR 94 | Temp 98.7°F | Ht 69.0 in | Wt 197.0 lb

## 2017-09-19 DIAGNOSIS — Z0001 Encounter for general adult medical examination with abnormal findings: Secondary | ICD-10-CM | POA: Diagnosis not present

## 2017-09-19 DIAGNOSIS — L989 Disorder of the skin and subcutaneous tissue, unspecified: Secondary | ICD-10-CM | POA: Diagnosis not present

## 2017-09-19 DIAGNOSIS — Z0000 Encounter for general adult medical examination without abnormal findings: Secondary | ICD-10-CM | POA: Diagnosis not present

## 2017-09-19 DIAGNOSIS — R05 Cough: Secondary | ICD-10-CM | POA: Diagnosis not present

## 2017-09-19 DIAGNOSIS — R059 Cough, unspecified: Secondary | ICD-10-CM

## 2017-09-19 LAB — CBC WITH DIFFERENTIAL/PLATELET
BASOS ABS: 0 10*3/uL (ref 0.0–0.1)
BASOS PCT: 0.8 % (ref 0.0–3.0)
EOS ABS: 0.1 10*3/uL (ref 0.0–0.7)
Eosinophils Relative: 2.3 % (ref 0.0–5.0)
HEMATOCRIT: 48.4 % (ref 39.0–52.0)
HEMOGLOBIN: 15.7 g/dL (ref 13.0–17.0)
LYMPHS PCT: 34.2 % (ref 12.0–46.0)
Lymphs Abs: 2.1 10*3/uL (ref 0.7–4.0)
MCHC: 32.5 g/dL (ref 30.0–36.0)
MCV: 88.7 fl (ref 78.0–100.0)
Monocytes Absolute: 0.5 10*3/uL (ref 0.1–1.0)
Monocytes Relative: 7.7 % (ref 3.0–12.0)
Neutro Abs: 3.4 10*3/uL (ref 1.4–7.7)
Neutrophils Relative %: 55 % (ref 43.0–77.0)
Platelets: 258 10*3/uL (ref 150.0–400.0)
RBC: 5.46 Mil/uL (ref 4.22–5.81)
RDW: 14 % (ref 11.5–15.5)
WBC: 6.1 10*3/uL (ref 4.0–10.5)

## 2017-09-19 LAB — URINALYSIS, ROUTINE W REFLEX MICROSCOPIC
BILIRUBIN URINE: NEGATIVE
Hgb urine dipstick: NEGATIVE
Ketones, ur: NEGATIVE
Leukocytes, UA: NEGATIVE
NITRITE: NEGATIVE
PH: 6.5 (ref 5.0–8.0)
RBC / HPF: NONE SEEN (ref 0–?)
Specific Gravity, Urine: <=1.005 — AB (ref 1.000–1.030)
Total Protein, Urine: NEGATIVE
UROBILINOGEN UA: 0.2 (ref 0.0–1.0)
Urine Glucose: NEGATIVE
WBC, UA: NONE SEEN (ref 0–?)

## 2017-09-19 LAB — HEPATIC FUNCTION PANEL
ALK PHOS: 75 U/L (ref 39–117)
ALT: 16 U/L (ref 0–53)
AST: 18 U/L (ref 0–37)
Albumin: 4.2 g/dL (ref 3.5–5.2)
BILIRUBIN DIRECT: 0.2 mg/dL (ref 0.0–0.3)
TOTAL PROTEIN: 7.2 g/dL (ref 6.0–8.3)
Total Bilirubin: 0.6 mg/dL (ref 0.2–1.2)

## 2017-09-19 LAB — BASIC METABOLIC PANEL
BUN: 13 mg/dL (ref 6–23)
CALCIUM: 9.7 mg/dL (ref 8.4–10.5)
CO2: 30 mEq/L (ref 19–32)
Chloride: 102 mEq/L (ref 96–112)
Creatinine, Ser: 1.24 mg/dL (ref 0.40–1.50)
GFR: 72.54 mL/min (ref >60.00)
GLUCOSE: 140 mg/dL — AB (ref 70–99)
Potassium: 4.4 mEq/L (ref 3.5–5.1)
Sodium: 138 mEq/L (ref 135–145)

## 2017-09-19 LAB — TSH: TSH: 0.85 u[IU]/mL (ref 0.35–4.50)

## 2017-09-19 LAB — LIPID PANEL
CHOLESTEROL: 118 mg/dL (ref 0–200)
HDL: 52 mg/dL (ref >39.00)
LDL Cholesterol: 48 mg/dL (ref 0–99)
NONHDL: 66.12
Total CHOL/HDL Ratio: 2
Triglycerides: 93 mg/dL (ref 0.0–149.0)
VLDL: 18.6 mg/dL (ref 0.0–40.0)

## 2017-09-19 LAB — PSA: PSA: 0 ng/mL — ABNORMAL LOW (ref 0.10–4.00)

## 2017-09-19 NOTE — Progress Notes (Signed)
Subjective:    Patient ID: Kevin Hale, male    DOB: Sep 15, 1940, 77 y.o.   MRN: [id number]  HPI  Here for wellness and f/u;  Overall doing ok;  Pt denies worsening SOB, DOE, wheezing, orthopnea, PND, worsening LE edema, palpitations, dizziness or syncope.  Pt denies neurological change such as new headache, facial or extremity weakness.  Pt denies polydipsia, polyuria, or low sugar symptoms. Pt states overall good compliance with treatment and medications, good tolerability, and has been trying to follow appropriate diet.  Pt denies worsening depressive symptoms, suicidal ideation or panic. No fever, night sweats, wt loss, loss of appetite, or other constitutional symptoms.  Pt states good ability with ADL's, has low fall risk, home safety reviewed and adequate, no other significant changes in hearing or vision, and only occasionally active with exercise. Also with chronic CP as per last vist persists as well as persistent nonprod cough  Also with bilat lower post neck pain when turn head with crackling   Also with a Knot to right forehead for years, for some reason more noticeable to him but no pain or overlying skin change Past Medical History:  Diagnosis Date  . Anxiety   . Atherosclerosis of abdominal aorta ([place]) 11/12/2011  . BPH (benign prostatic hyperplasia)   . Cancer of prostate ([place])   . Chronic anticoagulation 09/12/2016  . Diverticulosis of colon   . DJD (degenerative joint disease)    left foot, "pt denies"  . Glucose intolerance (impaired glucose tolerance) 11/03/2010  . History of colon polyps   . History of DVT (deep vein thrombosis)   . HTN (hypertension)   . Hypercholesteremia   . Lumbar degenerative disc disease 11/12/2011  . Plantar fasciitis    Past Surgical History:  Procedure Laterality Date  . INSERTION PROSTATE RADIATION SEED    . UVULECTOMY      reports that he has quit smoking. He has never used smokeless tobacco. He reports that he does not drink alcohol or use  drugs. family history includes Deep vein thrombosis in his mother; Diabetes in his mother; Healthy in his brother and brother. Allergies  Allergen Reactions  . Pravastatin Other (See Comments)    myalgia   Current Outpatient Medications on File Prior to Visit  Medication Sig Dispense Refill  . allopurinol (ZYLOPRIM) 100 MG tablet TAKE 1 TABLET BY MOUTH ONCE DAILY 90 tablet 0  . b complex vitamins tablet Take 1 tablet by mouth daily.    Marland Kitchen buPROPion (WELLBUTRIN SR) 150 MG 12 hr tablet Take 1 tablet (150 mg total) by mouth 2 (two) times daily. 60 tablet 5  . Cholecalciferol (VITAMIN D3) 1000 UNITS CAPS Take 1 Int'l Units by mouth daily.    . cilostazol (PLETAL) 50 MG tablet Take 1 tablet (50 mg total) by mouth 2 (two) times daily. 180 tablet 3  . lisinopril (PRINIVIL,ZESTRIL) 10 MG tablet TAKE 1 TABLET BY MOUTH ONCE DAILY 90 tablet 1  . rivaroxaban (XARELTO) 20 MG TABS tablet Take 1 tablet (20 mg total) by mouth daily with supper. 30 tablet 10  . simvastatin (ZOCOR) 20 MG tablet TAKE 1 TABLET BY MOUTH AT BEDTIME. 90 tablet 3   No current facility-administered medications on file prior to visit.    Review of Systems  Constitutional: Negative for other unusual diaphoresis or sweats HENT: Negative for ear discharge or swelling Eyes: Negative for other worsening visual disturbances Respiratory: Negative for stridor or other swelling  Gastrointestinal: Negative for worsening distension or  other blood Genitourinary: Negative for retention or other urinary change Musculoskeletal: Negative for other MSK pain or swelling Skin: Negative for color change or other new lesions Neurological: Negative for worsening tremors and other numbness  Psychiatric/Behavioral: Negative for worsening agitation or other fatigue All other system neg per pt    Objective:   Physical Exam BP 122/78   Pulse 94   Temp 98.7 F (37.1 C) (Oral)   Ht 5\' 9"  (1.753 m)   Wt 197 lb (89.4 kg)   SpO2 97%   BMI 29.09  kg/m  VS noted,  Constitutional: Pt appears in NAD HENT: Head: NCAT.  Right Ear: External ear normal.  Left Ear: External ear normal.  Eyes: . Pupils are equal, round, and reactive to light. Conjunctivae and EOM are normal Nose: without d/c or deformity Neck: Neck supple. Gross normal ROM Cardiovascular: Normal rate and regular rhythm.   Pulmonary/Chest: Effort normal and breath sounds without rales or wheezing.  Abd:  Soft, NT, ND, + BS, no organomegaly Neurological: Pt is alert. At baseline orientation, motor grossly intact Skin: Skin is warm. No rashes, other new lesions, no LE edema Psychiatric: Pt behavior is normal without agitation  No other exam findings Lab Results  Component Value Date   WBC 6.1 09/19/2017   HGB 15.7 09/19/2017   HCT 48.4 09/19/2017   PLT 258.0 09/19/2017   GLUCOSE 140 (H) 09/19/2017   CHOL 118 09/19/2017   TRIG 93.0 09/19/2017   HDL 52.00 09/19/2017   LDLCALC 48 09/19/2017   ALT 16 09/19/2017   AST 18 09/19/2017   NA 138 09/19/2017   K 4.4 09/19/2017   CL 102 09/19/2017   CREATININE 1.24 09/19/2017   BUN 13 09/19/2017   CO2 30 09/19/2017   TSH 0.85 09/19/2017   PSA 0.00 (L) 09/19/2017   INR 2.1 05/28/2017   HGBA1C 5.3 09/13/2016       Assessment & Plan:

## 2017-09-19 NOTE — Telephone Encounter (Signed)
-----   Message from Biagio Borg, MD sent at 09/19/2017 12:35 PM EDT ----- Left message on MyChart, pt to cont same tx except  The test results show that your current treatment is OK, except the blood sugar is mildly elevated.  We are checking an addon test called Hgba1c but you would only be coniidered to have diabetes if the test was abnormal.

## 2017-09-19 NOTE — Telephone Encounter (Signed)
Pt has been informed and expressed understanding.  

## 2017-09-19 NOTE — Patient Instructions (Signed)
Please continue all other medications as before, and refills have been done if requested.  Please have the pharmacy call with any other refills you may need.  Please continue your efforts at being more active, low cholesterol diet, and weight control.  You are otherwise up to date with prevention measures today.  Please keep your appointments with your specialists as you may have planned  You will be contacted regarding the referral for: pulmonary and dermatology  Please go to the LAB in the Basement (turn left off the elevator) for the tests to be done today  You will be contacted by phone if any changes need to be made immediately.  Otherwise, you will receive a letter about your results with an explanation, but please check with MyChart first.  Please remember to sign up for MyChart if you have not done so, as this will be important to you in the future with finding out test results, communicating by private email, and scheduling acute appointments online when needed.  Please return in 6 months, or sooner if needed

## 2017-09-20 NOTE — Assessment & Plan Note (Signed)
Right forehead subq nodule, pt requests derm referral

## 2017-09-20 NOTE — Assessment & Plan Note (Signed)

## 2017-09-20 NOTE — Assessment & Plan Note (Signed)
Ok for pulm referral 

## 2017-09-27 ENCOUNTER — Ambulatory Visit: Payer: Medicare Other | Admitting: Pulmonary Disease

## 2017-09-27 ENCOUNTER — Encounter: Payer: Self-pay | Admitting: Pulmonary Disease

## 2017-09-27 DIAGNOSIS — R05 Cough: Secondary | ICD-10-CM

## 2017-09-27 DIAGNOSIS — R059 Cough, unspecified: Secondary | ICD-10-CM

## 2017-09-27 NOTE — Assessment & Plan Note (Signed)
Cough may be related to smoking or to lisinopril.   Given chronic cough and left basilar crackles, need to rule out ILD Schedule high-resolution CT scan of the chest Schedule spirometry pre-and post  Okay to use Delsym cough syrup 5 mL twice daily for cough

## 2017-09-27 NOTE — Patient Instructions (Signed)
Cough may be related to smoking or to lisinopril. STOP taking lisinopril. Check your blood pressure daily, if remains higher than 140/90 then call us back for prescription for losartan 50 mg daily  Schedule high-resolution CT scan of the chest Schedule spirometry pre-and post  Congratulations on quitting smoking! Okay to use Delsym cough syrup 5 mL twice daily for cough

## 2017-09-27 NOTE — Progress Notes (Signed)
Subjective:    Patient ID: Kevin Hale, male    DOB: [birth year], 77 y.o.   MRN: [id number]  HPI  Chief Complaint  Patient presents with  . Pulm Consult    Referred by Dr. Cathlean Cower for a chronic cough. Per patient, he has had this cough since April 2019. States his cough is productive with grey phelgm.     77 year old ex-smoker presents for evaluation of chronic cough.  He also reports left-sided chest pain that has been ongoing for 2 months.  He reports chest pain of insidious onset, on and off especially on turning his body or neck which seems to get relieved by rest.  He was evaluated by PCP, chest x-ray 07/19/2017 was normal, EKG was normal.  Pain was attributed to costochondritis, it was felt that he could not be given NSAIDs since he was already on anticoagulation.  He reports cough of insidious onset around 05/2017, he points to his throat and states that this feels "like a bronchitis that will not go away".  He reports minimal gray phlegm production, cough does not wake him up at night, Mucinex has not provided him any relief.  He denies seasonal allergies or significant heartburn.  He denies significant dyspnea and can walk on a treadmill for 45 minutes at a fast pace.  He smokes about 5 cigarettes/day until he quit last month, about 20 pack years.  Past medical history of right lower extremity DVT x2, last more than 15 years ago, he is on anticoagulation, status post prostate cancer and seed implants  Medication review shows lisinopril which she has been taking for hypertension for at least 5 years and Xarelto    Past Medical History:  Diagnosis Date  . Anxiety   . Atherosclerosis of abdominal aorta ([place]) 11/12/2011  . BPH (benign prostatic hyperplasia)   . Cancer of prostate ([place])   . Chronic anticoagulation 09/12/2016  . Diverticulosis of colon   . DJD (degenerative joint disease)    left foot, "pt denies"  . Glucose intolerance (impaired glucose tolerance) 11/03/2010    . History of colon polyps   . History of DVT (deep vein thrombosis)   . HTN (hypertension)   . Hypercholesteremia   . Lumbar degenerative disc disease 11/12/2011  . Plantar fasciitis    Past Surgical History:  Procedure Laterality Date  . INSERTION PROSTATE RADIATION SEED    . UVULECTOMY      Allergies  Allergen Reactions  . Pravastatin Other (See Comments)    myalgia    Social History   Socioeconomic History  . Marital status: Married    Spouse name: Not on file  . Number of children: 2  . Years of education: Not on file  . Highest education level: Not on file  Occupational History  . Occupation: retired  Scientific laboratory technician  . Financial resource strain: Not on file  . Food insecurity:    Worry: Not on file    Inability: Not on file  . Transportation needs:    Medical: Not on file    Non-medical: Not on file  Tobacco Use  . Smoking status: Former Research scientist (life sciences)  . Smokeless tobacco: Never Used  Substance and Sexual Activity  . Alcohol use: No  . Drug use: No  . Sexual activity: Not Currently  Lifestyle  . Physical activity:    Days per week: Not on file    Minutes per session: Not on file  . Stress: Not on file  Relationships  .  Social connections:    Talks on phone: Not on file    Gets together: Not on file    Attends religious service: Not on file    Active member of club or organization: Not on file    Attends meetings of clubs or organizations: Not on file    Relationship status: Not on file  . Intimate partner violence:    Fear of current or ex partner: Not on file    Emotionally abused: Not on file    Physically abused: Not on file    Forced sexual activity: Not on file  Other Topics Concern  . Not on file  Social History Narrative  . Not on file      Family History  Problem Relation Age of Onset  . Deep vein thrombosis Mother   . Diabetes Mother   . Healthy Brother   . Healthy Brother   . Colon cancer Neg Hx   . Rectal cancer Neg Hx         Review of Systems  Constitutional: Negative for fever and unexpected weight change.  HENT: Negative for congestion, dental problem, ear pain, nosebleeds, postnasal drip, rhinorrhea, sinus pressure, sneezing, sore throat and trouble swallowing.   Eyes: Negative for redness and itching.  Respiratory: Positive for cough and shortness of breath. Negative for chest tightness and wheezing.   Cardiovascular: Positive for chest pain. Negative for palpitations and leg swelling.  Gastrointestinal: Negative for nausea and vomiting.  Genitourinary: Negative for dysuria.  Musculoskeletal: Negative for joint swelling.  Skin: Negative for rash.  Allergic/Immunologic: Negative.  Negative for environmental allergies, food allergies and immunocompromised state.  Neurological: Negative for headaches.  Hematological: Does not bruise/bleed easily.  Psychiatric/Behavioral: Positive for dysphoric mood. The patient is not nervous/anxious.        Objective:   Physical Exam  Gen. Pleasant, well-nourished, in no distress, normal affect ENT - no lesions, no post nasal drip Neck: No JVD, no thyromegaly, no carotid bruits Lungs: no use of accessory muscles, no dullness to percussion, left basal rales no rhonchi  Cardiovascular: Rhythm regular, heart sounds  normal, no murmurs or gallops, no peripheral edema, reproducible chest wall pain anterior 2-3 CC junction Abdomen: soft and non-tender, no hepatosplenomegaly, BS normal. Musculoskeletal: No deformities, no cyanosis or clubbing Neuro:  alert, non focal       Assessment & Plan:

## 2017-09-27 NOTE — Assessment & Plan Note (Signed)
Reproducible, not related to exertion. Does appear to be musculoskeletal?  Costochondritis He has not received a good trial of NSAIDs

## 2017-09-27 NOTE — Assessment & Plan Note (Signed)
STOP taking lisinopril. Check your blood pressure daily, if remains higher than 140/90 then call us back for prescription for losartan 50 mg daily

## 2017-10-11 ENCOUNTER — Ambulatory Visit (INDEPENDENT_AMBULATORY_CARE_PROVIDER_SITE_OTHER)
Admission: RE | Admit: 2017-10-11 | Discharge: 2017-10-11 | Disposition: A | Discharge status: Home / Self Care | Payer: Medicare Other | Source: Ambulatory Visit | Attending: Pulmonary Disease | Admitting: Pulmonary Disease

## 2017-10-11 DIAGNOSIS — R059 Cough, unspecified: Secondary | ICD-10-CM

## 2017-10-11 DIAGNOSIS — R05 Cough: Secondary | ICD-10-CM | POA: Diagnosis not present

## 2017-10-11 DIAGNOSIS — K449 Diaphragmatic hernia without obstruction or gangrene: Secondary | ICD-10-CM | POA: Diagnosis not present

## 2017-10-30 ENCOUNTER — Other Ambulatory Visit: Payer: Self-pay | Admitting: Internal Medicine

## 2017-11-25 NOTE — Progress Notes (Signed)
@Patient  ID: Kevin Hale, male    DOB: [birth year], 77 y.o.   MRN: [id number]  Chief Complaint  Patient presents with  . Follow-up    Cough, spiro pre and post today      Referring provider: Biagio Borg, MD  HPI:  77 year old male former smoker initially referred to our office on 09/27/2017 for chronic cough.  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  Quit July/2019.  20-pack-year. Maintenance:  none Pt of: Dr. Elsworth Soho  Recent [facility] Encounters:   09/27/17 - Initial OV - Elsworth Soho 77 year old ex-smoker presenting today for initial office visit for chronic cough.  Patient also reports is been having left-sided chest pain for the last 2 months.  Chest x-ray on 07/19/2017 was normal, EKG was normal.  Pain was attributed to costochondritis.  He reports cough of insidious onset on April/2019.  It is like "bronchitis that will not go away".  Patient reports minimal gray phlegm production.  Denies sick significant dyspnea and can walk on a treadmill for 45 minutes at a fast pace.  He was smoking about 5 cigarettes a day until he quit last month (July/2019).  20-pack-year smoking history.  Past history of DVT this was over 15 years ago.  Patient is on Xarelto.  Patient is also currently on lisinopril which she is been taking for hypertension. Patient with left basilar crackles on exam. Plan: Stop lisinopril, check your blood pressure daily, if blood pressures higher than 140/90 then call us and we can start on losartan 50 mg daily, high-res CT, schedule spirometry pre-and post  11/26/2017  - Visit   6 male former smoker (last cigarette was 3 days ago).  Following up today after completing pre-and post spirometry.  Patient is not currently on any inhalers.  Unfortunately patient did not stop lisinopril after last office visit as  instructed.  Patient is still currently taking 10 mg lisinopril.  Patient is still reporting a persistent dry cough.    Patient still having occasional costochondritis-like symptoms that he had a previous office visit.  Patient feels he needs an antibiotic.  Patient denies fevers or chills. Cough is not productive.   Patient spirometry today showing normal spirometry.  Slight mid flow reversibility after bronchodilator.  I also reviewed CT results the patient completed in August/2019 with patient.  Patient reports that they stopped smoking 3 days ago.  Patient reports that he had stopped smoking back in August/2019 but has had an occasional cigarette here there.  Last cigarette being 11/23/2017.   Tests:   07/19/2017-CT chest high-res- no infiltrates or effusions, minimal scarring at left lung base laterally  10/11/2017-CT chest high-res- mild cylindrical bronchiectasis and mild patchy tree-in-bud opacities, mild patchy air trapping in both lungs, small calcified pleural plaque, small hiatal hernia, no significant regions of subpleural reticulation, frank honeycombing  09/26/2016-echocardiogram-LV ejection fraction 55 to 78%, grade 1 diastolic dysfunction  Chart Review:  11/26/17- Pre-and post spirometry>>> normal spirometry, FVC 3.350 (94% predicted), postbronchodilator ratio 79, slight mid flow reversibility after bronchodilator response, no significant bronchodilator response from FVC or ratio    Specialty Problems      Pulmonary Problems   PULMONARY EMBOLISM    Qualifier: Diagnosis of  By: Marilynne Halsted, RN, BSN, Jacquelyn        Wheezing   Pneumonia involving left lung   Chronic bronchitis ([place])  Sinusitis, acute   Cough    11/26/17- Pre-and post spirometry>>> normal spirometry, FVC 3.350 (94% predicted), postbronchodilator ratio 79, slight mid flow reversibility after bronchodilator response, no significant bronchodilator response from FVC or ratio  11/26/17-lisinopril stopped       Allergic rhinitis      Allergies  Allergen Reactions  . Pravastatin Other (See Comments)    myalgia    Immunization History  Administered Date(s) Administered  . Influenza Whole 11/28/2009, 11/06/2010, 11/10/2011  . Influenza, High Dose Seasonal PF 12/31/2014, 10/17/2016, 11/05/2017  . Influenza,inj,Quad PF,6+ Mos 11/11/2012, 12/22/2015  . Influenza-Unspecified 10/27/2013, 11/30/2014  . Pneumococcal Conjugate-13 11/11/2012, 06/15/2014  . Pneumococcal Polysaccharide-23 02/28/2004, 11/13/2012  . Td 02/27/2000  . Tdap 06/15/2014  . Zoster 05/19/2013    Past Medical History:  Diagnosis Date  . Anxiety   . Atherosclerosis of abdominal aorta ([place]) 11/12/2011  . BPH (benign prostatic hyperplasia)   . Cancer of prostate ([place])   . Chronic anticoagulation 09/12/2016  . Diverticulosis of colon   . DJD (degenerative joint disease)    left foot, "pt denies"  . Glucose intolerance (impaired glucose tolerance) 11/03/2010  . History of colon polyps   . History of DVT (deep vein thrombosis)   . HTN (hypertension)   . Hypercholesteremia   . Lumbar degenerative disc disease 11/12/2011  . Plantar fasciitis     Tobacco History: Social History   Tobacco Use  Smoking Status Former Smoker  . Packs/day: 0.25  . Years: 40.00  . Pack years: 10.00  . Types: Cigarettes  . Start date: 02/26/1969  . Last attempt to quit: 10/28/2017  . Years since quitting: 0.0  Smokeless Tobacco Never Used   Counseling given: Yes  Smoking assessment and cessation counseling  Patient currently smoking: stopped smoking 3 days ago  I have advised the patient to quit/stop smoking as soon as possible due to high risk for multiple medical problems.  It will also be very difficult for [place] to manage patient's  respiratory symptoms and status if we continue to expose her lungs to a known irritant.  We do not advise e-cigarettes as a form of stopping smoking.  Patient is willing to quit smoking.  I have advised the  patient that we can assist and have options of nicotine replacement therapy, provided smoking cessation education today, provided smoking cessation counseling, and provided cessation resources.  Follow-up next office visit office visit for assessment of smoking cessation.  Smoking cessation counseling advised for: 4 min    Outpatient Encounter Medications as of 11/26/2017  Medication Sig  . allopurinol (ZYLOPRIM) 100 MG tablet TAKE 1 TABLET BY MOUTH ONCE DAILY  . b complex vitamins tablet Take 1 tablet by mouth daily.  . Cholecalciferol (VITAMIN D3) 1000 UNITS CAPS Take 1 Int'l Units by mouth daily.  . cilostazol (PLETAL) 50 MG tablet Take 1 tablet (50 mg total) by mouth 2 (two) times daily.  Marland Kitchen lisinopril (PRINIVIL,ZESTRIL) 10 MG tablet TAKE 1 TABLET BY MOUTH ONCE DAILY  . rivaroxaban (XARELTO) 20 MG TABS tablet Take 1 tablet (20 mg total) by mouth daily with supper.  . simvastatin (ZOCOR) 20 MG tablet TAKE 1 TABLET BY MOUTH AT BEDTIME  . [DISCONTINUED] buPROPion (WELLBUTRIN SR) 150 MG 12 hr tablet Take 1 tablet (150 mg total) by mouth 2 (two) times daily. (Patient not taking: Reported on 11/26/2017)   No facility-administered encounter medications on file as of 11/26/2017.      Review of Systems  Review of Systems  Constitutional:  Positive for fatigue. Negative for activity change, chills, fever and unexpected weight change.  HENT: Negative for congestion, postnasal drip, sinus pressure and sinus pain.   Eyes: Negative.   Respiratory: Positive for cough (Dry persistent cough has not changed since last office visit). Negative for shortness of breath and wheezing.   Cardiovascular: Positive for leg swelling (Minor chronic leg swelling, compression stockings applied bilaterally). Negative for chest pain and palpitations.  Gastrointestinal: Negative for constipation, diarrhea, nausea and vomiting.       Denies indigestion or heartburn  Endocrine: Negative.   Genitourinary: Negative for  difficulty urinating.  Musculoskeletal: Negative.   Skin: Negative.   Allergic/Immunologic: Negative for environmental allergies.  Neurological: Negative for dizziness and headaches.  Psychiatric/Behavioral: Negative.  Negative for dysphoric mood. The patient is not nervous/anxious.   All other systems reviewed and are negative.    Physical Exam  BP 110/72 (BP Location: Left Arm, Cuff Size: Normal)   Pulse 95   Ht 5\' 9"  (1.753 m)   Wt 199 lb (90.3 kg)   SpO2 99%   BMI 29.39 kg/m   Wt Readings from Last 5 Encounters:  11/26/17 199 lb (90.3 kg)  09/27/17 196 lb 12.8 oz (89.3 kg)  09/19/17 197 lb (89.4 kg)  08/21/17 194 lb (88 kg)  08/05/17 192 lb (87.1 kg)     Physical Exam  Constitutional: He is oriented to person, place, and time and well-developed, well-nourished, and in no distress. No distress.  HENT:  Head: Normocephalic and atraumatic.  Right Ear: Hearing, tympanic membrane, external ear and ear canal normal.  Left Ear: Hearing, tympanic membrane, external ear and ear canal normal.  Nose: Mucosal edema present. Right sinus exhibits no maxillary sinus tenderness and no frontal sinus tenderness. Left sinus exhibits no maxillary sinus tenderness and no frontal sinus tenderness.  Mouth/Throat: Oropharynx is clear and moist. No oropharyngeal exudate.  Moderate cerumen in both canals bilaterally  Eyes: Pupils are equal, round, and reactive to light.  Neck: Normal range of motion. Neck supple. No JVD present.  Cardiovascular: Normal rate, regular rhythm and normal heart sounds.  Pulmonary/Chest: Effort normal and breath sounds normal. No accessory muscle usage. No respiratory distress. He has no decreased breath sounds. He has no wheezes. He has no rhonchi. He has no rales.  Abdominal: Soft. Bowel sounds are normal. There is no tenderness.  Musculoskeletal: Normal range of motion. Edema: Compression stockings applied bilaterally, and unable to assess lower extremity edema.    Lymphadenopathy:    He has no cervical adenopathy.  Neurological: He is alert and oriented to person, place, and time. Gait normal.  Skin: Skin is warm and dry. He is not diaphoretic. No erythema.  Psychiatric: Mood, memory, affect and judgment normal.  Nursing note and vitals reviewed.    Lab Results:  CBC    Component Value Date/Time   WBC 6.1 09/19/2017 1016   RBC 5.46 09/19/2017 1016   HGB 15.7 09/19/2017 1016   HGB 15.3 06/25/2017 1038   HCT 48.4 09/19/2017 1016   HCT 46.3 06/25/2017 1038   PLT 258.0 09/19/2017 1016   PLT 257 06/25/2017 1038   MCV 88.7 09/19/2017 1016   MCV 86 06/25/2017 1038   MCH 28.5 06/25/2017 1038   MCH 28.7 10/31/2015 1326   MCHC 32.5 09/19/2017 1016   RDW 14.0 09/19/2017 1016   RDW 12.9 06/25/2017 1038   LYMPHSABS 2.1 09/19/2017 1016   MONOABS 0.5 09/19/2017 1016   EOSABS 0.1 09/19/2017 1016   BASOSABS  0.0 09/19/2017 1016    BMET    Component Value Date/Time   NA 138 09/19/2017 1016   NA 138 06/25/2017 1038   K 4.4 09/19/2017 1016   CL 102 09/19/2017 1016   CO2 30 09/19/2017 1016   GLUCOSE 140 (H) 09/19/2017 1016   BUN 13 09/19/2017 1016   BUN 13 06/25/2017 1038   CREATININE 1.24 09/19/2017 1016   CALCIUM 9.7 09/19/2017 1016   GFRNONAA 51 (L) 06/25/2017 1038   GFRAA 59 (L) 06/25/2017 1038    BNP No results found for: BNP  ProBNP    Component Value Date/Time   PROBNP 95 01/25/2017 1257    Imaging: No results found.    Assessment & Plan:   Pleasant 77 year old patient seen office visit today.  We will have patient stop lisinopril as previously instructed at last office visit.  Reminded patient that spirometry looks normal not showing a restrictive or obstructive lung disease process.  There is a slight response to the mid flow was with bronchodilators.  Could indicate small airway disease.  Similar findings on CT.  Also explained to patient that there is mild bronchiectasis on CT imaging.  This means patient needs to  follow-up with [place] closely if he does feel he is getting an infection starts having fever, chills, productive cough with change in color sputum.  If this does occur explained to patient only may try to obtain a sputum sample prior to starting antibiotic therapy. Could consider a flutter valve in the future, patient declined one today.  Patient to follow-up with Dr. Elsworth Soho in 2 to 3 months or sooner if symptoms are not improving.  Tobacco abuse Follow-up with our office in 2 to 3 months  Great job on stopping smoking.  Keep up the hard work examination  We recommend that you stop smoking.   Smoking Cessation Resources:  1 800 QUIT NOW  >>> Patient to call this resource and utilize it to help support her quit smoking >>> Keep up your hard work with stopping smoking  You can also contact the [facility] >>>For smoking cessation classes call [phone]  We do not recommend using e-cigarettes as a form of stopping smoking   Cough Please stop your lisinopril now  Check your blood pressure regularly >>>Notify our office if blood pressure is running higher than 140/90  There is mild bronchiectasis on your CT: This is the medical term which indicates that you have damage, dilated airways making you more susceptible to respiratory infection. Let us know if you have cough with change in mucus color or fevers or chills.  At that point you would need an antibiotic. Maintain a healthy nutritious diet, eating whole foods Take your medications as prescribed   Follow-up with our office in 2 to 3 months  Great job on stopping smoking.  Keep up the hard work examination  We recommend that you stop smoking.   Smoking Cessation Resources:  1 800 QUIT NOW  >>> Patient to call this resource and utilize it to help support her quit smoking >>> Keep up your hard work with stopping smoking  You can also contact the [facility] >>>For smoking cessation classes call  [phone]  We do not recommend using e-cigarettes as a form of stopping smoking   Abnormal finding on lung imaging There is mild bronchiectasis on your CT: This is the medical term which indicates that you have damage, dilated airways making you more susceptible to respiratory infection.  Let us know if you have cough with change in mucus color or fevers or chills.  At that point you would need an antibiotic. Maintain a healthy nutritious diet, eating whole foods Take your medications as prescribed   Follow-up with our office in 2 to 3 months  Great job on stopping smoking.  Keep up the hard work examination  We recommend that you stop smoking.   Smoking Cessation Resources:  1 800 QUIT NOW  >>> Patient to call this resource and utilize it to help support her quit smoking >>> Keep up your hard work with stopping smoking  You can also contact the [facility] >>>For smoking cessation classes call [phone]  We do not recommend using e-cigarettes as a form of stopping smoking      Lauraine Rinne, NP 11/26/2017

## 2017-11-26 ENCOUNTER — Ambulatory Visit (INDEPENDENT_AMBULATORY_CARE_PROVIDER_SITE_OTHER): Payer: Medicare Other | Admitting: Pulmonary Disease

## 2017-11-26 ENCOUNTER — Encounter: Payer: Self-pay | Admitting: Pulmonary Disease

## 2017-11-26 ENCOUNTER — Ambulatory Visit: Payer: Medicare Other | Admitting: Pulmonary Disease

## 2017-11-26 VITALS — BP 110/72 | HR 95 | Ht 69.0 in | Wt 199.0 lb

## 2017-11-26 DIAGNOSIS — R059 Cough, unspecified: Secondary | ICD-10-CM

## 2017-11-26 DIAGNOSIS — F17210 Nicotine dependence, cigarettes, uncomplicated: Secondary | ICD-10-CM

## 2017-11-26 DIAGNOSIS — R05 Cough: Secondary | ICD-10-CM | POA: Diagnosis not present

## 2017-11-26 DIAGNOSIS — R918 Other nonspecific abnormal finding of lung field: Secondary | ICD-10-CM | POA: Insufficient documentation

## 2017-11-26 DIAGNOSIS — Z720 Tobacco use: Secondary | ICD-10-CM

## 2017-11-26 LAB — PULMONARY FUNCTION TEST
FEF 25-75 PRE: 2.22 L/s
FEF 25-75 Post: 2.83 L/sec
FEF2575-%Change-Post: 27 %
FEF2575-%PRED-POST: 137 %
FEF2575-%Pred-Pre: 108 %
FEV1-%Change-Post: 3 %
FEV1-%PRED-POST: 104 %
FEV1-%PRED-PRE: 100 %
FEV1-POST: 2.7 L
FEV1-Pre: 2.6 L
FEV1FVC-%Change-Post: 1 %
FEV1FVC-%Pred-Pre: 103 %
FEV6-%CHANGE-POST: 2 %
FEV6-%PRED-POST: 102 %
FEV6-%Pred-Pre: 99 %
FEV6-PRE: 3.34 L
FEV6-Post: 3.41 L
FEV6FVC-%CHANGE-POST: 0 %
FEV6FVC-%PRED-POST: 106 %
FEV6FVC-%PRED-PRE: 106 %
FVC-%CHANGE-POST: 2 %
FVC-%PRED-POST: 96 %
FVC-%Pred-Pre: 94 %
FVC-Post: 3.42 L
FVC-Pre: 3.35 L
POST FEV1/FVC RATIO: 79 %
Post FEV6/FVC ratio: 100 %
Pre FEV1/FVC ratio: 78 %
Pre FEV6/FVC Ratio: 100 %

## 2017-11-26 NOTE — Progress Notes (Signed)
Spirometry pre and post done today. 

## 2017-11-26 NOTE — Assessment & Plan Note (Signed)
Follow-up with our office in 2 to 3 months  Great job on stopping smoking.  Keep up the hard work examination  We recommend that you stop smoking.   Smoking Cessation Resources:  1 800 QUIT NOW  >>> Patient to call this resource and utilize it to help support her quit smoking >>> Keep up your hard work with stopping smoking  You can also contact the [facility] >>>For smoking cessation classes call [phone]  We do not recommend using e-cigarettes as a form of stopping smoking

## 2017-11-26 NOTE — Assessment & Plan Note (Signed)
There is mild bronchiectasis on your CT: This is the medical term which indicates that you have damage, dilated airways making you more susceptible to respiratory infection. Let us know if you have cough with change in mucus color or fevers or chills.  At that point you would need an antibiotic. Maintain a healthy nutritious diet, eating whole foods Take your medications as prescribed   Follow-up with our office in 2 to 3 months  Great job on stopping smoking.  Keep up the hard work examination  We recommend that you stop smoking.   Smoking Cessation Resources:  1 800 QUIT NOW  >>> Patient to call this resource and utilize it to help support her quit smoking >>> Keep up your hard work with stopping smoking  You can also contact the [facility] >>>For smoking cessation classes call [phone]  We do not recommend using e-cigarettes as a form of stopping smoking

## 2017-11-26 NOTE — Patient Instructions (Signed)
Please stop your lisinopril now  Check your blood pressure regularly >>>Notify our office if blood pressure is running higher than 140/90  There is mild bronchiectasis on your CT: This is the medical term which indicates that you have damage, dilated airways making you more susceptible to respiratory infection. Let us know if you have cough with change in mucus color or fevers or chills.  At that point you would need an antibiotic. Maintain a healthy nutritious diet, eating whole foods Take your medications as prescribed   Follow-up with our office in 2 to 3 months  Great job on stopping smoking.  Keep up the hard work examination  We recommend that you stop smoking.   Smoking Cessation Resources:  1 800 QUIT NOW  >>> Patient to call this resource and utilize it to help support her quit smoking >>> Keep up your hard work with stopping smoking  You can also contact the [facility] >>>For smoking cessation classes call [phone]  We do not recommend using e-cigarettes as a form of stopping smoking    It is flu season:   >>>Remember to be washing your hands regularly, using hand sanitizer, be careful to use around herself with has contact with people who are sick will increase her chances of getting sick yourself. >>> Best ways to protect herself from the flu: Receive the yearly flu vaccine, practice good hand hygiene washing with soap and also using hand sanitizer when available, eat a nutritious meals, get adequate rest, hydrate appropriately    As of 12/30/2017 we will be moving! We will no longer be at our [place] location.   Our new address and phone number will be:  [address] [place], [place] [address] Telephone number: [phone]   Please contact the office if your symptoms worsen or you have concerns that you are not improving.   Thank you for choosing [facility] for your healthcare, and for allowing [place] to partner with you on  your healthcare journey. I am thankful to be able to provide care to you today.   Wyn Quaker FNP-C    Coping with Quitting Smoking Quitting smoking is a physical and mental challenge. You will face cravings, withdrawal symptoms, and temptation. Before quitting, work with your health care provider to make a plan that can help you cope. Preparation can help you quit and keep you from giving in. How can I cope with cravings? Cravings usually last for 5-10 minutes. If you get through it, the craving will pass. Consider taking the following actions to help you cope with cravings:  Keep your mouth busy: ? Chew sugar-free gum. ? Suck on hard candies or a straw. ? Brush your teeth.  Keep your hands and body busy: ? Immediately change to a different activity when you feel a craving. ? Squeeze or play with a ball. ? Do an activity or a hobby, like making bead jewelry, practicing needlepoint, or working with wood. ? Mix up your normal routine. ? Take a short exercise break. Go for a quick walk or run up and down stairs. ? Spend time in public places where smoking is not allowed.  Focus on doing something kind or helpful for someone else.  Call a friend or family member to talk during a craving.  Join a support group.  Call a quit line, such as 1-800-QUIT-NOW.  Talk with your health care provider about medicines that might help you cope with cravings and make quitting  easier for you.  How can I deal with withdrawal symptoms? Your body may experience negative effects as it tries to get used to not having nicotine in the system. These effects are called withdrawal symptoms. They may include:  Feeling hungrier than normal.  Trouble concentrating.  Irritability.  Trouble sleeping.  Feeling depressed.  Restlessness and agitation.  Craving a cigarette.  To manage withdrawal symptoms:  Avoid places, people, and activities that trigger your cravings.  Remember why you want to  quit.  Get plenty of sleep.  Avoid coffee and other caffeinated drinks. These may worsen some of your symptoms.  How can I handle social situations? Social situations can be difficult when you are quitting smoking, especially in the first few weeks. To manage this, you can:  Avoid parties, bars, and other social situations where people might be smoking.  Avoid alcohol.  Leave right away if you have the urge to smoke.  Explain to your family and friends that you are quitting smoking. Ask for understanding and support.  Plan activities with friends or family where smoking is not an option.  What are some ways I can cope with stress? Wanting to smoke may cause stress, and stress can make you want to smoke. Find ways to manage your stress. Relaxation techniques can help. For example:  Breathe slowly and deeply, in through your nose and out through your mouth.  Listen to soothing, relaxing music.  Talk with a family member or friend about your stress.  Light a candle.  Soak in a bath or take a shower.  Think about a peaceful place.  What are some ways I can prevent weight gain? Be aware that many people gain weight after they quit smoking. However, not everyone does. To keep from gaining weight, have a plan in place before you quit and stick to the plan after you quit. Your plan should include:  Having healthy snacks. When you have a craving, it may help to: ? Eat plain popcorn, crunchy carrots, celery, or other cut vegetables. ? Chew sugar-free gum.  Changing how you eat: ? Eat small portion sizes at meals. ? Eat 4-6 small meals throughout the day instead of 1-2 large meals a day. ? Be mindful when you eat. Do not watch television or do other things that might distract you as you eat.  Exercising regularly: ? Make time to exercise each day. If you do not have time for a long workout, do short bouts of exercise for 5-10 minutes several times a day. ? Do some form of  strengthening exercise, like weight lifting, and some form of aerobic exercise, like running or swimming.  Drinking plenty of water or other low-calorie or no-calorie drinks. Drink 6-8 glasses of water daily, or as much as instructed by your health care provider.  Summary  Quitting smoking is a physical and mental challenge. You will face cravings, withdrawal symptoms, and temptation to smoke again. Preparation can help you as you go through these challenges.  You can cope with cravings by keeping your mouth busy (such as by chewing gum), keeping your body and hands busy, and making calls to family, friends, or a helpline for people who want to quit smoking.  You can cope with withdrawal symptoms by avoiding places where people smoke, avoiding drinks with caffeine, and getting plenty of rest.  Ask your health care provider about the different ways to prevent weight gain, avoid stress, and handle social situations. This information is not intended to  replace advice given to you by your health care provider. Make sure you discuss any questions you have with your health care provider. Document Released: 02/10/2016 Document Revised: 02/10/2016 Document Reviewed: 02/10/2016 Elsevier Interactive Patient Education  [employer].

## 2017-11-26 NOTE — Assessment & Plan Note (Signed)
Please stop your lisinopril now  Check your blood pressure regularly >>>Notify our office if blood pressure is running higher than 140/90  There is mild bronchiectasis on your CT: This is the medical term which indicates that you have damage, dilated airways making you more susceptible to respiratory infection. Let us know if you have cough with change in mucus color or fevers or chills.  At that point you would need an antibiotic. Maintain a healthy nutritious diet, eating whole foods Take your medications as prescribed   Follow-up with our office in 2 to 3 months  Great job on stopping smoking.  Keep up the hard work examination  We recommend that you stop smoking.   Smoking Cessation Resources:  1 800 QUIT NOW  >>> Patient to call this resource and utilize it to help support her quit smoking >>> Keep up your hard work with stopping smoking  You can also contact the [facility] >>>For smoking cessation classes call [phone]  We do not recommend using e-cigarettes as a form of stopping smoking

## 2017-12-16 ENCOUNTER — Other Ambulatory Visit: Payer: Self-pay | Admitting: Internal Medicine

## 2017-12-18 NOTE — Progress Notes (Signed)
Kevin Hale, [place] [address] Phone: [phone] Subjective:    I Kevin Hale am serving as a Education administrator for Dr. Hulan Saas.      CC: Right shoulder pain, and right hip pain Kevin Hale is a 77 y.o. male coming in with complaint of leg and right shoulder pain.  Rates the severity pain is 9 out of 10.  Legs feel about the same. Achy pain in the groin.    Patient feels that the shoulder is waking him up at night.  Feels like he is having increasing weakness.  Most of it seems to be anterior.  Rates the severity pain is 9 out of 10.  Right groin pain.  Seems to radiate down towards the knee.  Sometimes feels like his leg is weak.  Have seen before and started on Pletal.  Was making some improvement but now not.  Patient rates the severity pain is 8 out of 10.  Affecting daily activities.    Past Medical History:  Diagnosis Date  . Anxiety   . Atherosclerosis of abdominal aorta ([place]) 11/12/2011  . BPH (benign prostatic hyperplasia)   . Cancer of prostate ([place])   . Chronic anticoagulation 09/12/2016  . Diverticulosis of colon   . DJD (degenerative joint disease)    left foot, "pt denies"  . Glucose intolerance (impaired glucose tolerance) 11/03/2010  . History of colon polyps   . History of DVT (deep vein thrombosis)   . HTN (hypertension)   . Hypercholesteremia   . Lumbar degenerative disc disease 11/12/2011  . Plantar fasciitis    Past Surgical History:  Procedure Laterality Date  . INSERTION PROSTATE RADIATION SEED    . UVULECTOMY     Social History   Socioeconomic History  . Marital status: Married    Spouse name: Not on file  . Number of children: 2  . Years of education: Not on file  . Highest education level: Not on file  Occupational History  . Occupation: retired  Scientific laboratory technician  . Financial resource strain: Not on file  . Food insecurity:    Worry: Not on file    Inability: Not on file  .  Transportation needs:    Medical: Not on file    Non-medical: Not on file  Tobacco Use  . Smoking status: Former Smoker    Packs/day: 0.25    Years: 40.00    Pack years: 10.00    Types: Cigarettes    Start date: 02/26/1969    Last attempt to quit: 11/23/2017    Years since quitting: 0.0  . Smokeless tobacco: Never Used  Substance and Sexual Activity  . Alcohol use: No  . Drug use: No  . Sexual activity: Not Currently  Lifestyle  . Physical activity:    Days per week: Not on file    Minutes per session: Not on file  . Stress: Not on file  Relationships  . Social connections:    Talks on phone: Not on file    Gets together: Not on file    Attends religious service: Not on file    Active member of club or organization: Not on file    Attends meetings of clubs or organizations: Not on file    Relationship status: Not on file  Other Topics Concern  . Not on file  Social History Narrative  . Not on file   Allergies  Allergen Reactions  . Pravastatin  Other (See Comments)    myalgia   Family History  Problem Relation Age of Onset  . Deep vein thrombosis Mother   . Diabetes Mother   . Healthy Brother   . Healthy Brother   . Colon cancer Neg Hx   . Rectal cancer Neg Hx      Current Outpatient Medications (Cardiovascular):  .  lisinopril (PRINIVIL,ZESTRIL) 10 MG tablet, TAKE 1 TABLET BY MOUTH ONCE DAILY .  simvastatin (ZOCOR) 20 MG tablet, TAKE 1 TABLET BY MOUTH AT BEDTIME   Current Outpatient Medications (Analgesics):  .  allopurinol (ZYLOPRIM) 100 MG tablet, TAKE 1 TABLET BY MOUTH ONCE DAILY  Current Outpatient Medications (Hematological):  .  cilostazol (PLETAL) 50 MG tablet, Take 1 tablet (50 mg total) by mouth 2 (two) times daily. .  rivaroxaban (XARELTO) 20 MG TABS tablet, Take 1 tablet (20 mg total) by mouth daily with supper.  Current Outpatient Medications (Other):  .  b complex vitamins tablet, Take 1 tablet by mouth daily. .  Cholecalciferol (VITAMIN D3)  1000 UNITS CAPS, Take 1 Int'l Units by mouth daily.    Past medical history, social, surgical and family history all reviewed in electronic medical record.  No pertanent information unless stated regarding to the chief complaint.   Review of Systems:  No headache, visual changes, nausea, vomiting, diarrhea, constipation, dizziness, abdominal pain, skin rash, fevers, chills, night sweats, weight loss, swollen lymph nodes, body aches, joint swelling, muscle aches, chest pain, shortness of breath, mood changes.   Objective  Blood pressure 110/62, pulse 77, height 5\' 9"  (1.753 m), weight 201 lb (91.2 kg), SpO2 98 %.   General: No apparent distress alert and oriented x3 mood and affect normal, dressed appropriately.  HEENT: Pupils equal, extraocular movements intact  Respiratory: Patient's speak in full sentences and does not appear short of breath  Cardiovascular: No lower extremity edema, non tender, no erythema  Skin: Warm dry intact with no signs of infection or rash on extremities or on axial skeleton.  Abdomen: Soft nontender  Neuro: Cranial nerves II through XII are intact, neurovascularly intact in all extremities with 2+ DTRs and 2+ pulses.  Lymph: No lymphadenopathy of posterior or anterior cervical chain or axillae bilaterally.  Gait antalgic mild shuffling gait MSK:  tender with full range of motion and good stability and symmetric strength and tone of  elbows, wrist, hip, knee and ankles bilaterally.  Right shoulder exam shows the patient does have 4-5 strength.  Positive speeds test and positive pain with internal rotation.  Mild positive O'Brien's.  Minimal pain over the acromial clavicular joint.  More pain on the anterior aspect of the shoulder. Right leg  MSK US performed of: Right shoulder This study was ordered, performed, and interpreted by Kevin Hale D.O.  Shoulder:   Supraspinatus: Degenerative changes Subscapularis:  Appears normal on long and transverse views. AC  joint: Swelling noted Glenohumeral Joint: Mild to moderate arthritic changes Glenoid Labrum:  Intact without visualized tears. Biceps Tendon: Significant hypoechoic changes of the tendon sheath noted. Impression: Bicep tendinitis with joint effusion  Procedure: Real-time Ultrasound Guided Injection of right glenohumeral joint Device: GE Logiq Q7  Ultrasound guided injection is preferred based studies that show increased duration, increased effect, greater accuracy, decreased procedural pain, increased response rate with ultrasound guided versus blind injection.  Verbal informed consent obtained.  Time-out conducted.  Noted no overlying erythema, induration, or other signs of local infection.  Skin prepped in a sterile fashion.  Local anesthesia: Topical  Ethyl chloride.  With sterile technique and under real time ultrasound guidance:  Joint visualized.  23g 1  inch needle inserted anterior approach. Pictures taken for needle placement. Patient did have injection of 2 cc of 1% lidocaine, 2 cc of 0.5% Marcaine, and 1.0 cc of Kenalog 40 mg/dL. Completed without difficulty  Pain immediately resolved suggesting accurate placement of the medication.  Advised to call if fevers/chills, erythema, induration, drainage, or persistent bleeding.  Images permanently stored and available for review in the ultrasound unit.  Impression: Technically successful ultrasound guided injection.   Impression and Recommendations:     This case required medical decision making of moderate complexity. The above documentation has been reviewed and is accurate and complete Lyndal Pulley, DO       Note: This dictation was prepared with Dragon dictation along with smaller phrase technology. Any transcriptional errors that result from this process are unintentional.

## 2017-12-19 ENCOUNTER — Encounter: Payer: Self-pay | Admitting: Family Medicine

## 2017-12-19 ENCOUNTER — Ambulatory Visit (INDEPENDENT_AMBULATORY_CARE_PROVIDER_SITE_OTHER)
Admission: RE | Admit: 2017-12-19 | Discharge: 2017-12-19 | Disposition: A | Discharge status: Home / Self Care | Payer: Medicare Other | Source: Ambulatory Visit | Attending: Family Medicine | Admitting: Family Medicine

## 2017-12-19 ENCOUNTER — Ambulatory Visit: Payer: Self-pay

## 2017-12-19 ENCOUNTER — Ambulatory Visit: Payer: Medicare Other | Admitting: Family Medicine

## 2017-12-19 ENCOUNTER — Other Ambulatory Visit: Payer: Medicare Other

## 2017-12-19 VITALS — BP 110/62 | HR 77 | Ht 69.0 in | Wt 201.0 lb

## 2017-12-19 DIAGNOSIS — G8929 Other chronic pain: Secondary | ICD-10-CM

## 2017-12-19 DIAGNOSIS — M7521 Bicipital tendinitis, right shoulder: Secondary | ICD-10-CM | POA: Insufficient documentation

## 2017-12-19 DIAGNOSIS — M25511 Pain in right shoulder: Secondary | ICD-10-CM | POA: Diagnosis not present

## 2017-12-19 DIAGNOSIS — M25551 Pain in right hip: Secondary | ICD-10-CM

## 2017-12-19 DIAGNOSIS — M549 Dorsalgia, unspecified: Secondary | ICD-10-CM

## 2017-12-19 DIAGNOSIS — M545 Low back pain: Secondary | ICD-10-CM | POA: Diagnosis not present

## 2017-12-19 DIAGNOSIS — M5136 Other intervertebral disc degeneration, lumbar region: Secondary | ICD-10-CM

## 2017-12-19 DIAGNOSIS — S79911A Unspecified injury of right hip, initial encounter: Secondary | ICD-10-CM | POA: Diagnosis not present

## 2017-12-19 NOTE — Assessment & Plan Note (Signed)
Lumbar degenerative disc disease.  Likely given some radicular pain.  X-rays ordered today with him being greater than 77 year old.  Patient is having some mild worsening symptoms.  Encourage patient to take the Pletal still on a regular basis.  Discussed continuing the allopurinol.  Patient will get a thigh compression sleeve.  Follow-up again in 4 weeks

## 2017-12-19 NOTE — Assessment & Plan Note (Signed)
Injected today.  Discussed icing regimen and home exercises discussed icing regimen.  Patient is going to do topical anti-inflammatories.  Did feel better after the injection.  We will continue to monitor.  Follow-up again in 4 to 6 weeks

## 2017-12-19 NOTE — Patient Instructions (Signed)
Good to see you  Stay active Keep hands within peripheral vison  Consider a thight and arm compression  Ice 20 minutes 2 times daily. Usually after activity and before bed. Exercises 3 times a week.  Injected the shoulder  Xray of hip downstairs  See me again in 4 weeks

## 2017-12-20 LAB — SYNOVIAL CELL COUNT + DIFF, W/ CRYSTALS
Basophils, %: 0 %
EOSINOPHILS-SYNOVIAL: 0 % (ref 0–2)
Lymphocytes-Synovial Fld: 24 % (ref 0–74)
MONOCYTE/MACROPHAGE: 68 % (ref 0–69)
NEUTROPHIL, SYNOVIAL: 8 % (ref 0–24)
Synoviocytes, %: 0 % (ref 0–15)
WBC, SYNOVIAL: 122 {cells}/uL (ref <150)

## 2017-12-25 ENCOUNTER — Telehealth: Payer: Self-pay | Admitting: Pulmonary Disease

## 2017-12-25 MED ORDER — LOSARTAN POTASSIUM 25 MG PO TABS: 25.0000 mg | ORAL_TABLET | Freq: Every day | ORAL | 0 refills | Status: DC

## 2017-12-25 NOTE — Telephone Encounter (Signed)
LMTCB

## 2017-12-25 NOTE — Telephone Encounter (Signed)
Patient returned phone call; pt contact # [phone]

## 2017-12-25 NOTE — Telephone Encounter (Signed)
Called and spoke to pt. Informed him of the recs per Aaron Edelman, NP. Pt preferred 90 day script, rx sent for 90 tablets with 0 refills. Pt verbalized understanding and is aware to follow up with his PCP for long term management. Nothing further needed at this time.

## 2017-12-25 NOTE — Telephone Encounter (Signed)
Called and spoke to pt. Pt states he has had a few readings above 140 SBP, pt states his breathing has improved since last OV - doesn't get as SOB during exertion and cough is unchanged (dry cough). Per last OV pt was to call back if BP readings were above 140/90, pt stopped taking Lisinopril on 10/2. Pt's recent BP readings below: 10/2 - 128/80 10/4 - 135/68  10/7 - 150/65 10/8 - 145/73 10/15 - 128/62 10/28 - 148/61 10/30 - 151/79  Aaron Edelman, please advise. Thanks.

## 2017-12-25 NOTE — Telephone Encounter (Signed)
Pt is calling back [phone]

## 2017-12-25 NOTE — Telephone Encounter (Signed)
Okay.  Thank the patient for following up as well as for checking his blood pressure.  Since we have stopped his lisinopril we could start losartan 25 mg daily.  Please place order for >>>Losartan 25 mg tablet >>>Take 1 tablet daily >>> 30 tablets, 3 refills  Patient needs to continue to check his blood pressure to ensure its below 140/90.  If blood pressure remains elevated please contact our office or primary care for further evaluation.  When you present back to primary care or cardiology to let them know that we switch his blood pressure medication again this may have been driving some of your cough.  They can take over chronic management of your blood pressure as well as this medication for further refills.  Wyn Quaker, FNP

## 2017-12-26 ENCOUNTER — Encounter: Payer: Self-pay | Admitting: Internal Medicine

## 2017-12-31 ENCOUNTER — Ambulatory Visit: Payer: Medicare Other | Admitting: *Deleted

## 2017-12-31 DIAGNOSIS — Z86718 Personal history of other venous thrombosis and embolism: Secondary | ICD-10-CM

## 2017-12-31 LAB — CBC
Hematocrit: 47.3 % (ref 37.5–51.0)
Hemoglobin: 16.1 g/dL (ref 13.0–17.7)
MCH: 28.6 pg (ref 26.6–33.0)
MCHC: 34 g/dL (ref 31.5–35.7)
MCV: 84 fL (ref 79–97)
PLATELETS: 284 10*3/uL (ref 150–450)
RBC: 5.63 x10E6/uL (ref 4.14–5.80)
RDW: 11.7 % — AB (ref 12.3–15.4)
WBC: 7.6 10*3/uL (ref 3.4–10.8)

## 2017-12-31 LAB — BASIC METABOLIC PANEL
BUN / CREAT RATIO: 14 (ref 10–24)
BUN: 18 mg/dL (ref 8–27)
CO2: 24 mmol/L (ref 20–29)
Calcium: 10 mg/dL (ref 8.6–10.2)
Chloride: 102 mmol/L (ref 96–106)
Creatinine, Ser: 1.25 mg/dL (ref 0.76–1.27)
GFR calc non Af Amer: 55 mL/min/{1.73_m2} — ABNORMAL LOW (ref >59)
GFR, EST AFRICAN AMERICAN: 63 mL/min/{1.73_m2} (ref >59)
GLUCOSE: 127 mg/dL — AB (ref 65–99)
Potassium: 4.6 mmol/L (ref 3.5–5.2)
SODIUM: 138 mmol/L (ref 134–144)

## 2017-12-31 NOTE — Progress Notes (Signed)
Pt was started on Xarelto 20mg  for pulmonary embolism and DVT on 05/28/17 per Dr. Harrington Challenger.   Reviewed patients medication list.  Pt is not currently on any combined P-gp and strong CYP3A4 inhibitors/inducers (ketoconazole, traconazole, ritonavir, carbamazepine, phenytoin, rifampin, St. John's wort).  Reviewed labs: SCr-1.25, Hgb-16.1, HCT-47.3, Weight-89.1kg, CrCl-61.66ml/min.  Dose appropriate based on CrCl.   Hgb and HCT within normal limits.   A full discussion of the nature of anticoagulants has been carried out.  A benefit/risk analysis has been presented to the patient, so that they understand the justification for choosing anticoagulation with Xarelto at this time.  The need for compliance is stressed.  Pt is aware to take the medication once daily with the largest meal of the day.  Side effects of potential bleeding are discussed, including unusual colored urine or stools, coughing up blood or coffee ground emesis, nose bleeds or serious fall or head trauma.  Discussed signs and symptoms of stroke. The patient should avoid any OTC items containing aspirin or ibuprofen.  Avoid alcohol consumption.   Call if any signs of abnormal bleeding.  Discussed financial obligations and resolved any difficulty in obtaining medication.    01/01/18-Spoke with pt and advised pt of labs and instructed pt to continue taking Xarelto 20mg  with the largest meal of day. Pt will follow up with Dr. Harrington Challenger and pt is aware that labs will need to monitored annually or as suggested per Physician.

## 2018-01-19 NOTE — Progress Notes (Signed)
Cardiology Office Note   Date:  01/20/2018   ID:  Cynda Acres, DOB 12-21-1940, MRN [id number]  PCP:  Biagio Borg, MD  Cardiologist:   Dorris Carnes, MD   Pt presents for f/u of DVT    History of Present Illness: Kevin Hale is a 77 y.o. male with a history of chronic LE DVT and PE  Echo in 2012 showed normal LVEF and RVEF   I saw him in Nov 2018    He was seen by Kathleen Argue in the interval   In Jan 2019    Since seen he has done OK   COmplains of a cough    BP has been 130s to 150s at home    Denies CP    Outpatient Medications Prior to Visit  Medication Sig Dispense Refill  . allopurinol (ZYLOPRIM) 100 MG tablet TAKE 1 TABLET BY MOUTH ONCE DAILY 90 tablet 0  . b complex vitamins tablet Take 1 tablet by mouth daily.    . Cholecalciferol (VITAMIN D3) 1000 UNITS CAPS Take 1 Int'l Units by mouth daily.    . cilostazol (PLETAL) 50 MG tablet Take 1 tablet (50 mg total) by mouth 2 (two) times daily. 180 tablet 3  . losartan (COZAAR) 25 MG tablet Take 1 tablet (25 mg total) by mouth daily. 90 tablet 0  . rivaroxaban (XARELTO) 20 MG TABS tablet Take 1 tablet (20 mg total) by mouth daily with supper. 30 tablet 10  . simvastatin (ZOCOR) 20 MG tablet TAKE 1 TABLET BY MOUTH AT BEDTIME 90 tablet 3   No facility-administered medications prior to visit.      Allergies:   Pravastatin   Past Medical History:  Diagnosis Date  . Anxiety   . Atherosclerosis of abdominal aorta ([place]) 11/12/2011  . BPH (benign prostatic hyperplasia)   . Cancer of prostate ([place])   . Chronic anticoagulation 09/12/2016  . Diverticulosis of colon   . DJD (degenerative joint disease)    left foot, "pt denies"  . Glucose intolerance (impaired glucose tolerance) 11/03/2010  . History of colon polyps   . History of DVT (deep vein thrombosis)   . HTN (hypertension)   . Hypercholesteremia   . Lumbar degenerative disc disease 11/12/2011  . Plantar fasciitis     Past Surgical History:  Procedure Laterality Date    . INSERTION PROSTATE RADIATION SEED    . UVULECTOMY       Social History:  The patient  reports that he quit smoking about 8 weeks ago. His smoking use included cigarettes. He started smoking about 48 years ago. He has a 10.00 pack-year smoking history. He has never used smokeless tobacco. He reports that he does not drink alcohol or use drugs.   Family History:  The patient's family history includes Deep vein thrombosis in his mother; Diabetes in his mother; Healthy in his brother and brother.    ROS:  Please see the history of present illness. All other systems are reviewed and  Negative to the above problem except as noted.    PHYSICAL EXAM: VS:  BP 130/64   Pulse 82   Ht 5\' 9"  (1.753 m)   Wt 198 lb 12.8 oz (90.2 kg)   SpO2 98%   BMI 29.36 kg/m   GEN: Overweight 77 yo  in no acute distress  HEENT: normal  Neck: no JVD, carotid bruits, or masses Cardiac: RRR; no murmurs, rubs, or gallops,  Tr edema  Respiratory:   Relatively  clear   GI: soft, nontender, nondistended, + BS  No hepatomegaly  MS: no deformity Moving all extremities   Skin: warm and dry, no rash Neuro:  Strength and sensation are intact Psych: euthymic mood, full affect   EKG:  EKG is not ordered today.    Lipid Panel    Component Value Date/Time   CHOL 118 09/19/2017 1016   TRIG 93.0 09/19/2017 1016   HDL 52.00 09/19/2017 1016   CHOLHDL 2 09/19/2017 1016   VLDL 18.6 09/19/2017 1016   LDLCALC 48 09/19/2017 1016      Wt Readings from Last 3 Encounters:  01/20/18 198 lb 12.8 oz (90.2 kg)  12/19/17 201 lb (91.2 kg)  11/26/17 199 lb (90.3 kg)      ASSESSMENT AND PLAN: 1/  Hx of DVT/PE  Continue coumadin     3  HTN  BP is high at home   Recomm he increase cozaar to 25 bid      4  PVOD  Atherosclerosis of aorta  NO symptoms to sugg symptomatic angina   5  HL   Conitnue  statin   LDL was 48 in July 2019    F/U next fall.     Stay active     Current medicines are reviewed at length with  the patient today.  The patient does not have concerns regarding medicines.  Signed, Dorris Carnes, MD  01/20/2018 9:18 AM    [address], [place], [place]  [address] Phone: [phone]; Fax: [phone]

## 2018-01-20 ENCOUNTER — Encounter: Payer: Self-pay | Admitting: Internal Medicine

## 2018-01-20 ENCOUNTER — Ambulatory Visit: Payer: Medicare Other | Admitting: Internal Medicine

## 2018-01-20 VITALS — BP 130/64 | HR 82 | Ht 69.0 in | Wt 198.8 lb

## 2018-01-20 DIAGNOSIS — I700 Atherosclerosis of aorta: Secondary | ICD-10-CM

## 2018-01-20 DIAGNOSIS — I10 Essential (primary) hypertension: Secondary | ICD-10-CM

## 2018-01-20 MED ORDER — LOSARTAN POTASSIUM 25 MG PO TABS: 25.0000 mg | ORAL_TABLET | Freq: Two times a day (BID) | ORAL | 3 refills | Status: DC

## 2018-01-20 NOTE — Patient Instructions (Signed)
Medication Instructions:  Your physician has recommended you make the following change in your medication:  1.) increase losartan to 25 mg twice a day  If you need a refill on your cardiac medications before your next appointment, please call your pharmacy.   Lab work: none If you have labs (blood work) drawn today and your tests are completely normal, you will receive your results only by: Marland Kitchen MyChart Message (if you have MyChart) OR . A paper copy in the mail If you have any lab test that is abnormal or we need to change your treatment, we will call you to review the results.  Testing/Procedures: none  Follow-Up: At [facility], you and your health needs are our priority.  As part of our continuing mission to provide you with exceptional heart care, we have created designated Provider Care Teams.  These Care Teams include your primary Cardiologist (physician) and Advanced Practice Providers (APPs -  Physician Assistants and Nurse Practitioners) who all work together to provide you with the care you need, when you need it. You will need a follow up appointment in:  10 months.  Please call our office 2 months in advance to schedule this appointment.  You may see Dorris Carnes, MD or one of the following Advanced Practice Providers on your designated Care Team: Richardson Dopp, PA-C [place], [place] . Daune Perch, NP  Any Other Special Instructions Will Be Listed Below (If Applicable).

## 2018-01-21 ENCOUNTER — Ambulatory Visit: Payer: Medicare Other | Admitting: Family Medicine

## 2018-01-21 ENCOUNTER — Encounter: Payer: Self-pay | Admitting: Family Medicine

## 2018-01-21 DIAGNOSIS — M25551 Pain in right hip: Secondary | ICD-10-CM

## 2018-01-21 NOTE — Patient Instructions (Signed)
Good to see you  Ice is your friend Try biking or elliptical if you can or at least flat treadmill  Stay active See me agai nin 4 weeks to make sure it goes away or we will consider MRI of the hip

## 2018-01-21 NOTE — Assessment & Plan Note (Addendum)
I believe that this is likely more multifactorial.  Has had low back pain, also has had difficulty with vascular compromise and had his on Pletal.  Do not want to increase dose secondary to patient's blood pressure.  Patient is on a blood thinner already for chronic DVT in this leg.  Post inflammation changes could also be contributing.  Patient's x-rays do not show any true arthritic changes but does have some loss of range of motion could either be muscular or early avascular necrosis.  Patient does not want to do any type of formal physical therapy at this point and would like to hold on any advanced imaging such as an MRI.  Discussed with patient about icing regimen, core strengthening, different exercises that would be beneficial.  Follow-up again in 4 to 6 weeks spent  25 minutes with patient face-to-face and had greater than 50% of counseling including as described above in assessment and plan.

## 2018-01-21 NOTE — Progress Notes (Signed)
[name] [address], [place] [address] Phone: [phone] Subjective:    I Kandace Blitz am serving as a Education administrator for Dr. Hulan Saas.   I'm seeing this patient by the request  of:    CC: Right shoulder pain  [id number]  Kevin Hale is a 77 y.o. male coming in with complaint of right shoulder pain. Shoulder is doing well. Right leg pain. Adductor and hip pain. Feels muscular.   Onset- Chronic (since last visit) Patient states that the shoulder is feeling much better.  Continues to have right groin pain.  Patient was sent for x-rays at last visit they were independently visualized by me.  X-rays of patient's hip and back show very minimal arthritic changes.  Patient still states with a lot of walking especially on a elevated surface can have more discomfort.  Past medical history is significant for prostate cancer but denies any fevers chills or any abnormal weight loss recently.     Past Medical History:  Diagnosis Date  . Anxiety   . Atherosclerosis of abdominal aorta ([facility]) 11/12/2011  . BPH (benign prostatic hyperplasia)   . Cancer of prostate ([facility])   . Chronic anticoagulation 09/12/2016  . Diverticulosis of colon   . DJD (degenerative joint disease)    left foot, "pt denies"  . Glucose intolerance (impaired glucose tolerance) 11/03/2010  . History of colon polyps   . History of DVT (deep vein thrombosis)   . HTN (hypertension)   . Hypercholesteremia   . Lumbar degenerative disc disease 11/12/2011  . Plantar fasciitis    Past Surgical History:  Procedure Laterality Date  . INSERTION PROSTATE RADIATION SEED    . UVULECTOMY     Social History   Socioeconomic History  . Marital status: Married    Spouse name: Not on file  . Number of children: 2  . Years of education: Not on file  . Highest education level: Not on file  Occupational History  . Occupation: retired  Scientific laboratory technician  . Financial resource strain: Not on file    . Food insecurity:    Worry: Not on file    Inability: Not on file  . Transportation needs:    Medical: Not on file    Non-medical: Not on file  Tobacco Use  . Smoking status: Former Smoker    Packs/day: 0.25    Years: 40.00    Pack years: 10.00    Types: Cigarettes    Start date: 02/26/1969    Last attempt to quit: 11/23/2017    Years since quitting: 0.1  . Smokeless tobacco: Never Used  Substance and Sexual Activity  . Alcohol use: No  . Drug use: No  . Sexual activity: Not Currently  Lifestyle  . Physical activity:    Days per week: Not on file    Minutes per session: Not on file  . Stress: Not on file  Relationships  . Social connections:    Talks on phone: Not on file    Gets together: Not on file    Attends religious service: Not on file    Active member of club or organization: Not on file    Attends meetings of clubs or organizations: Not on file    Relationship status: Not on file  Other Topics Concern  . Not on file  Social History Narrative  . Not on file   Allergies  Allergen Reactions  . Pravastatin Other (See Comments)  myalgia   Family History  Problem Relation Age of Onset  . Deep vein thrombosis Mother   . Diabetes Mother   . Healthy Brother   . Healthy Brother   . Colon cancer Neg Hx   . Rectal cancer Neg Hx      Current Outpatient Medications (Cardiovascular):  .  losartan (COZAAR) 25 MG tablet, Take 1 tablet (25 mg total) by mouth 2 (two) times daily. .  simvastatin (ZOCOR) 20 MG tablet, TAKE 1 TABLET BY MOUTH AT BEDTIME   Current Outpatient Medications (Analgesics):  .  allopurinol (ZYLOPRIM) 100 MG tablet, TAKE 1 TABLET BY MOUTH ONCE DAILY  Current Outpatient Medications (Hematological):  .  cilostazol (PLETAL) 50 MG tablet, Take 1 tablet (50 mg total) by mouth 2 (two) times daily. .  rivaroxaban (XARELTO) 20 MG TABS tablet, Take 1 tablet (20 mg total) by mouth daily with supper.  Current Outpatient Medications (Other):  .  b  complex vitamins tablet, Take 1 tablet by mouth daily. .  Cholecalciferol (VITAMIN D3) 1000 UNITS CAPS, Take 1 Int'l Units by mouth daily.    Past medical history, social, surgical and family history all reviewed in electronic medical record.  No pertanent information unless stated regarding to the chief complaint.   Review of Systems:  No headache, visual changes, nausea, vomiting, diarrhea, constipation, dizziness, abdominal pain, skin rash, fevers, chills, night sweats, weight loss, swollen lymph nodes, body aches, joint swelling, muscle aches, chest pain, shortness of breath, mood changes.   Objective  Blood pressure 114/68, pulse 93, height 5\' 9"  (1.753 m), weight 198 lb (89.8 kg), SpO2 98 %.    General: No apparent distress alert and oriented x3 mood and affect normal, dressed appropriately.  HEENT: Pupils equal, extraocular movements intact  Respiratory: Patient's speak in full sentences and does not appear short of breath  Cardiovascular: No lower extremity edema, non tender, no erythema  Skin: Warm dry intact with no signs of infection or rash on extremities or on axial skeleton.  Abdomen: Soft nontender  Neuro: Cranial nerves II through XII are intact, neurovascularly intact in all extremities with 2+ DTRs and 2+ pulses.  Lymph: No lymphadenopathy of posterior or anterior cervical chain or axillae bilaterally.  Gait mild antalgic MSK:  Non tender with full range of motion and good stability and symmetric strength and tone of  elbows, wrist,  knee and ankles bilaterally.  Mild arthritic changes of multiple joints  Right shoulder exam does show a positive impingement noted.   Right hip examination with decreased internal range of motion.  Patient does have a negative straight leg test.  Neurovascular intact distally.  Still some discomfort with compression of the leg.   Impression and Recommendations:     This case required medical decision making of moderate complexity. The  above documentation has been reviewed and is accurate and complete Lyndal Pulley, DO       Note: This dictation was prepared with Dragon dictation along with smaller phrase technology. Any transcriptional errors that result from this process are unintentional.

## 2018-02-07 DIAGNOSIS — R3915 Urgency of urination: Secondary | ICD-10-CM | POA: Diagnosis not present

## 2018-02-17 ENCOUNTER — Ambulatory Visit: Payer: Medicare Other | Admitting: Family Medicine

## 2018-02-17 ENCOUNTER — Other Ambulatory Visit: Payer: Self-pay | Admitting: Pulmonary Disease

## 2018-03-06 DIAGNOSIS — L720 Epidermal cyst: Secondary | ICD-10-CM | POA: Diagnosis not present

## 2018-03-17 ENCOUNTER — Other Ambulatory Visit: Payer: Self-pay | Admitting: Internal Medicine

## 2018-03-25 ENCOUNTER — Ambulatory Visit: Payer: Medicare Other | Admitting: Internal Medicine

## 2018-03-26 ENCOUNTER — Ambulatory Visit (INDEPENDENT_AMBULATORY_CARE_PROVIDER_SITE_OTHER): Payer: Medicare Other | Admitting: Internal Medicine

## 2018-03-26 ENCOUNTER — Encounter: Payer: Self-pay | Admitting: Internal Medicine

## 2018-03-26 VITALS — BP 124/84 | HR 95 | Temp 98.3°F | Ht 69.0 in | Wt 197.0 lb

## 2018-03-26 DIAGNOSIS — R221 Localized swelling, mass and lump, neck: Secondary | ICD-10-CM | POA: Insufficient documentation

## 2018-03-26 DIAGNOSIS — I10 Essential (primary) hypertension: Secondary | ICD-10-CM

## 2018-03-26 DIAGNOSIS — R7302 Impaired glucose tolerance (oral): Secondary | ICD-10-CM | POA: Diagnosis not present

## 2018-03-26 DIAGNOSIS — I2510 Atherosclerotic heart disease of native coronary artery without angina pectoris: Secondary | ICD-10-CM | POA: Diagnosis not present

## 2018-03-26 DIAGNOSIS — Z0000 Encounter for general adult medical examination without abnormal findings: Secondary | ICD-10-CM

## 2018-03-26 DIAGNOSIS — E7800 Pure hypercholesterolemia, unspecified: Secondary | ICD-10-CM

## 2018-03-26 DIAGNOSIS — J479 Bronchiectasis, uncomplicated: Secondary | ICD-10-CM | POA: Diagnosis not present

## 2018-03-26 HISTORY — DX: Bronchiectasis, uncomplicated: J47.9

## 2018-03-26 HISTORY — DX: Atherosclerotic heart disease of native coronary artery without angina pectoris: I25.10

## 2018-03-26 MED ORDER — TRIAMCINOLONE ACETONIDE 55 MCG/ACT NA AERO: 2.0000 | INHALATION_SPRAY | Freq: Every day | NASAL | 12 refills | Status: DC

## 2018-03-26 NOTE — Progress Notes (Signed)
Subjective:    Patient ID: Kevin Hale, male    DOB: Dec 08, 1940, 78 y.o.   MRN: [id number]  HPI    Here with persistent chronic cough non productive, has some post nasal gtt but also has known bronchiectasis by recent CT also noting coronary calcifications.  Pt denies chest pain, increased sob or doe, wheezing, orthopnea, PND, increased LE swelling, palpitations, dizziness or syncope.   Pt denies fever, wt loss, night sweats, loss of appetite, or other constitutional symptoms  Pt denies new neurological symptoms such as new headache, or facial or extremity weakness or numbness   Pt denies polydipsia, polyuria, or low sugar symptoms such as weakness or confusion improved with po intake.  Pt states overall good compliance with meds.  Does have a knot to the left mid submandibular area he thinks no change but also says he has not noticed before Past Medical History:  Diagnosis Date  . Anxiety   . Atherosclerosis of abdominal aorta ([facility]) 11/12/2011  . BPH (benign prostatic hyperplasia)   . Bronchiectasis ([facility]) 03/26/2018  . Cancer of prostate ([facility])   . Chronic anticoagulation 09/12/2016  . Coronary artery calcification seen on CT scan 03/26/2018  . Diverticulosis of colon   . DJD (degenerative joint disease)    left foot, "pt denies"  . Glucose intolerance (impaired glucose tolerance) 11/03/2010  . History of colon polyps   . History of DVT (deep vein thrombosis)   . HTN (hypertension)   . Hypercholesteremia   . Lumbar degenerative disc disease 11/12/2011  . Plantar fasciitis    Past Surgical History:  Procedure Laterality Date  . INSERTION PROSTATE RADIATION SEED    . UVULECTOMY      reports that he quit smoking about 4 months ago. His smoking use included cigarettes. He started smoking about 49 years ago. He has a 10.00 pack-year smoking history. He has never used smokeless tobacco. He reports that he does not drink alcohol or use drugs. family history includes Deep vein thrombosis in his  mother; Diabetes in his mother; Healthy in his brother and brother. Allergies  Allergen Reactions  . Pravastatin Other (See Comments)    myalgia   Current Outpatient Medications on File Prior to Visit  Medication Sig Dispense Refill  . allopurinol (ZYLOPRIM) 100 MG tablet TAKE 1 TABLET BY MOUTH ONCE DAILY 90 tablet 0  . b complex vitamins tablet Take 1 tablet by mouth daily.    . Cholecalciferol (VITAMIN D3) 1000 UNITS CAPS Take 1 Int'l Units by mouth daily.    . cilostazol (PLETAL) 50 MG tablet Take 1 tablet (50 mg total) by mouth 2 (two) times daily. 180 tablet 3  . losartan (COZAAR) 25 MG tablet Take 1 tablet (25 mg total) by mouth 2 (two) times daily. 180 tablet 3  . rivaroxaban (XARELTO) 20 MG TABS tablet Take 1 tablet (20 mg total) by mouth daily with supper. 30 tablet 10  . simvastatin (ZOCOR) 20 MG tablet TAKE 1 TABLET BY MOUTH AT BEDTIME 90 tablet 3   No current facility-administered medications on file prior to visit.    Review of Systems  Constitutional: Negative for other unusual diaphoresis or sweats HENT: Negative for ear discharge or swelling Eyes: Negative for other worsening visual disturbances Respiratory: Negative for stridor or other swelling  Gastrointestinal: Negative for worsening distension or other blood Genitourinary: Negative for retention or other urinary change Musculoskeletal: Negative for other MSK pain or swelling Skin: Negative for color change or other new lesions  Neurological: Negative for worsening tremors and other numbness  Psychiatric/Behavioral: Negative for worsening agitation or other fatigue All other system neg per pt    Objective:   Physical Exam BP 124/84   Pulse 95   Temp 98.3 F (36.8 C) (Oral)   Ht 5\' 9"  (1.753 m)   Wt 197 lb (89.4 kg)   SpO2 97%   BMI 29.09 kg/m  VS noted,  Constitutional: Pt appears in NAD HENT: Head: NCAT.  Right Ear: External ear normal.  Left Ear: External ear normal.  Eyes: . Pupils are equal,  round, and reactive to light. Conjunctivae and EOM are normal Nose: without d/c or deformity Neck: Neck supple. Gross normal ROM, has > 1 cm subq firm smooth mass left mid submandibular Cardiovascular: Normal rate and regular rhythm.   Pulmonary/Chest: Effort normal and breath sounds without rales or wheezing.  Abd:  Soft, NT, ND, + BS, no organomegaly Neurological: Pt is alert. At baseline orientation, motor grossly intact Skin: Skin is warm. No rashes, other new lesions, no LE edema Psychiatric: Pt behavior is normal without agitation  No other exam findings  Lab Results  Component Value Date   WBC 7.6 12/31/2017   HGB 16.1 12/31/2017   HCT 47.3 12/31/2017   PLT 284 12/31/2017   GLUCOSE 127 (H) 12/31/2017   CHOL 118 09/19/2017   TRIG 93.0 09/19/2017   HDL 52.00 09/19/2017   LDLCALC 48 09/19/2017   ALT 16 09/19/2017   AST 18 09/19/2017   NA 138 12/31/2017   K 4.6 12/31/2017   CL 102 12/31/2017   CREATININE 1.25 12/31/2017   BUN 18 12/31/2017   CO2 24 12/31/2017   TSH 0.85 09/19/2017   PSA 0.00 (L) 09/19/2017   INR 2.1 05/28/2017   HGBA1C 5.3 09/13/2016       Assessment & Plan:

## 2018-03-26 NOTE — Patient Instructions (Signed)
Please take all new medication as prescribed - the nasacort for post nasal gtt  Please continue all other medications as before, and refills have been done if requested.  Please have the pharmacy call with any other refills you may need.  Please continue your efforts at being more active, low cholesterol diet, and weight control.  Please keep your appointments with your specialists as you may have planned - Pulmonary and Cardiology  Please return if you feel the mass in the left neck is getting any larger or painful  Please return in 6 months, or sooner if needed, with Lab testing done 3-5 days before

## 2018-03-27 DIAGNOSIS — D170 Benign lipomatous neoplasm of skin and subcutaneous tissue of head, face and neck: Secondary | ICD-10-CM | POA: Diagnosis not present

## 2018-03-27 DIAGNOSIS — D1739 Benign lipomatous neoplasm of skin and subcutaneous tissue of other sites: Secondary | ICD-10-CM | POA: Diagnosis not present

## 2018-03-27 NOTE — Assessment & Plan Note (Signed)
stable overall by history and exam, recent data reviewed with pt, and pt to continue medical treatment as before,  to f/u any worsening symptoms or concerns  

## 2018-03-27 NOTE — Assessment & Plan Note (Signed)
likely benign, but suggested ENT referral but declines

## 2018-03-27 NOTE — Assessment & Plan Note (Signed)
Suspect probable related to cough, o/w stable without s/s flare, cont same tx and cough med prn,  to f/u any worsening symptoms or concerns

## 2018-04-04 DIAGNOSIS — Z4802 Encounter for removal of sutures: Secondary | ICD-10-CM | POA: Diagnosis not present

## 2018-04-24 DIAGNOSIS — H93A1 Pulsatile tinnitus, right ear: Secondary | ICD-10-CM | POA: Diagnosis not present

## 2018-05-17 ENCOUNTER — Other Ambulatory Visit: Payer: Self-pay | Admitting: Internal Medicine

## 2018-05-19 NOTE — Telephone Encounter (Signed)
78 yo, wt 197lbs, Scr 1.25 on 12/31/17 Crcl 26ml/min Last OV 01/20/18

## 2018-06-16 ENCOUNTER — Other Ambulatory Visit: Payer: Self-pay | Admitting: Internal Medicine

## 2018-08-13 ENCOUNTER — Telehealth: Payer: Self-pay | Admitting: Family Medicine

## 2018-08-13 NOTE — Telephone Encounter (Signed)
I have scheduled patient for July 1st with Dr. Tamala Julian for shoulder pain.  He is a current pt of Dr. Tamala Julian.  Did not want to see Dr. Raeford Razor on the 26th.  I have added pt to the wait list.  He would like a call to come in sooner if possible.

## 2018-08-13 NOTE — Telephone Encounter (Signed)
Pt scheduled for tomorrow at 2:15pm.

## 2018-08-14 ENCOUNTER — Ambulatory Visit: Payer: Self-pay

## 2018-08-14 ENCOUNTER — Other Ambulatory Visit: Payer: Self-pay

## 2018-08-14 ENCOUNTER — Encounter: Payer: Self-pay | Admitting: Family Medicine

## 2018-08-14 ENCOUNTER — Ambulatory Visit: Payer: Medicare Other | Admitting: Family Medicine

## 2018-08-14 VITALS — BP 140/74 | HR 101 | Ht 69.0 in | Wt 194.0 lb

## 2018-08-14 DIAGNOSIS — G8929 Other chronic pain: Secondary | ICD-10-CM

## 2018-08-14 DIAGNOSIS — M25511 Pain in right shoulder: Secondary | ICD-10-CM | POA: Diagnosis not present

## 2018-08-14 DIAGNOSIS — M7521 Bicipital tendinitis, right shoulder: Secondary | ICD-10-CM

## 2018-08-14 NOTE — Patient Instructions (Addendum)
Good to see you  Voltaren gel over the counter Keep hands within your peripheral vision See me again in 4 weeks if not better

## 2018-08-14 NOTE — Progress Notes (Signed)
[name] [address] [place], [place] [address] Phone: [phone] Subjective:      CC: Patient with history of the right femur from previously  [id number]  Kevin Hale is a 78 y.o. male coming in with complaint of right shoulder pain. States that he has trouble with flexion.  Patient consistently difficulty previously.  Patient was found to have a bicep tendinitis with seem to be a significant amount of effusion anteriorly previously.  Patient was given an injection greater than 8 months ago.  Has been doing very well until last month.  Has been having to move a lot of furniture secondary to a week in his house that he thinks exacerbated the problem.  Describes the pain as a sharp pain followed by a dull ache.  Can wake him up at night.     Past Medical History:  Diagnosis Date  . Anxiety   . Atherosclerosis of abdominal aorta ([place]) 11/12/2011  . BPH (benign prostatic hyperplasia)   . Bronchiectasis ([place] [facility]) 03/26/2018  . Cancer of prostate ([facility])   . Chronic anticoagulation 09/12/2016  . Coronary artery calcification seen on CT scan 03/26/2018  . Diverticulosis of colon   . DJD (degenerative joint disease)    left foot, "pt denies"  . Glucose intolerance (impaired glucose tolerance) 11/03/2010  . History of colon polyps   . History of DVT (deep vein thrombosis)   . HTN (hypertension)   . Hypercholesteremia   . Lumbar degenerative disc disease 11/12/2011  . Plantar fasciitis    Past Surgical History:  Procedure Laterality Date  . INSERTION PROSTATE RADIATION SEED    . UVULECTOMY     Social History   Socioeconomic History  . Marital status: Married    Spouse name: Not on file  . Number of children: 2  . Years of education: Not on file  . Highest education level: Not on file  Occupational History  . Occupation: retired  Scientific laboratory technician  . Financial resource strain: Not on file  . Food insecurity    Worry: Not on file    Inability: Not on  file  . Transportation needs    Medical: Not on file    Non-medical: Not on file  Tobacco Use  . Smoking status: Former Smoker    Packs/day: 0.25    Years: 40.00    Pack years: 10.00    Types: Cigarettes    Start date: 02/26/1969    Quit date: 11/23/2017    Years since quitting: 0.7  . Smokeless tobacco: Never Used  Substance and Sexual Activity  . Alcohol use: No  . Drug use: No  . Sexual activity: Not Currently  Lifestyle  . Physical activity    Days per week: Not on file    Minutes per session: Not on file  . Stress: Not on file  Relationships  . Social Herbalist on phone: Not on file    Gets together: Not on file    Attends religious service: Not on file    Active member of club or organization: Not on file    Attends meetings of clubs or organizations: Not on file    Relationship status: Not on file  Other Topics Concern  . Not on file  Social History Narrative  . Not on file   Allergies  Allergen Reactions  . Pravastatin Other (See Comments)    myalgia   Family History  Problem Relation Age of Onset  .  Deep vein thrombosis Mother   . Diabetes Mother   . Healthy Brother   . Healthy Brother   . Colon cancer Neg Hx   . Rectal cancer Neg Hx      Current Outpatient Medications (Cardiovascular):  .  losartan (COZAAR) 25 MG tablet, Take 1 tablet (25 mg total) by mouth 2 (two) times daily. .  simvastatin (ZOCOR) 20 MG tablet, TAKE 1 TABLET BY MOUTH AT BEDTIME  Current Outpatient Medications (Respiratory):  .  triamcinolone (NASACORT) 55 MCG/ACT AERO nasal inhaler, Place 2 sprays into the nose daily.  Current Outpatient Medications (Analgesics):  .  allopurinol (ZYLOPRIM) 100 MG tablet, Take 1 tablet by mouth once daily  Current Outpatient Medications (Hematological):  .  cilostazol (PLETAL) 50 MG tablet, Take 1 tablet (50 mg total) by mouth 2 (two) times daily. Alveda Reasons 20 MG TABS tablet, TAKE 1 TABLET BY MOUTH ONCE DAILY WITH SUPPER  Current  Outpatient Medications (Other):  .  b complex vitamins tablet, Take 1 tablet by mouth daily. .  Cholecalciferol (VITAMIN D3) 1000 UNITS CAPS, Take 1 Int'l Units by mouth daily.    Past medical history, social, surgical and family history all reviewed in electronic medical record.  No pertanent information unless stated regarding to the chief complaint.   Review of Systems:  No headache, visual changes, nausea, vomiting, diarrhea, constipation, dizziness, abdominal pain, skin rash, fevers, chills, night sweats, weight loss, swollen lymph nodes, body aches, joint swelling, muscle aches, chest pain, shortness of breath, mood changes.   Objective  Blood pressure 140/74, pulse (!) 101, height 5\' 9"  (1.753 m), weight 194 lb (88 kg), SpO2 98 %.    General: No apparent distress alert and oriented x3 mood and affect normal, dressed appropriately.  HEENT: Pupils equal, extraocular movements intact  Respiratory: Patient's speak in full sentences and does not appear short of breath  Cardiovascular: No lower extremity edema, non tender, no erythema  Skin: Warm dry intact with no signs of infection or rash on extremities or on axial skeleton.  Abdomen: Soft nontender  Neuro: Cranial nerves II through XII are intact, neurovascularly intact in all extremities with 2+ DTRs and 2+ pulses.  Lymph: No lymphadenopathy of posterior or anterior cervical chain or axillae bilaterally.  Gaitshuffling gait  MSK:  tender with limited range of motion and stability and symmetric strength and tone of  elbows, wrist, hip, knee and ankles bilaterally.  Right shoulder exam shows the patient does have a positive speeds test.  Positive impingement.  4-5 strength of rotator cuff compared to contralateral side.  Mild tenderness of the shoulder anteriorly.  Mild pain in the acromioclavicular joint.  Procedure: Real-time Ultrasound Guided Injection of right bicep tendon sheath anteriorly Device: GE Logiq Q7  Ultrasound guided  injection is preferred based studies that show increased duration, increased effect, greater accuracy, decreased procedural pain, increased response rate with ultrasound guided versus blind injection.  Verbal informed consent obtained.  Time-out conducted.  Noted no overlying erythema, induration, or other signs of local infection.  Skin prepped in a sterile fashion.  Local anesthesia: Topical Ethyl chloride.  With sterile technique and under real time ultrasound guidance:  Joint visualized.  23g 1  inch needle inserted anterior approach. Pictures taken for needle placement. Patient did have injection of2 cc of 0.5% Marcaine, and 1.0 cc of Kenalog 40 mg/dL. Completed without difficulty  Pain immediately resolved suggesting accurate placement of the medication.  Advised to call if fevers/chills, erythema, induration, drainage, or  persistent bleeding.  Images permanently stored and available for review in the ultrasound unit.  Impression: Technically successful ultrasound guided injection.    Impression and Recommendations:     This case required medical decision making of moderate complexity. The above documentation has been reviewed and is accurate and complete Lyndal Pulley, DO       Note: This dictation was prepared with Dragon dictation along with smaller phrase technology. Any transcriptional errors that result from this process are unintentional.

## 2018-08-14 NOTE — Assessment & Plan Note (Signed)
Patient given injection and tolerated the procedure well.  Discussed icing regimen and home exercises.  Discussed compression and over-the-counter topical anti-inflammatories.  Patient is to increase activity follow-up again in 4 to 6 weeks

## 2018-08-20 ENCOUNTER — Other Ambulatory Visit (INDEPENDENT_AMBULATORY_CARE_PROVIDER_SITE_OTHER): Payer: Medicare Other

## 2018-08-20 DIAGNOSIS — R7302 Impaired glucose tolerance (oral): Secondary | ICD-10-CM

## 2018-08-20 DIAGNOSIS — Z0000 Encounter for general adult medical examination without abnormal findings: Secondary | ICD-10-CM

## 2018-08-20 LAB — LIPID PANEL
Cholesterol: 116 mg/dL (ref 0–200)
HDL: 50.6 mg/dL (ref >39.00)
LDL Cholesterol: 48 mg/dL (ref 0–99)
NonHDL: 65.16
Total CHOL/HDL Ratio: 2
Triglycerides: 84 mg/dL (ref 0.0–149.0)
VLDL: 16.8 mg/dL (ref 0.0–40.0)

## 2018-08-20 LAB — CBC WITH DIFFERENTIAL/PLATELET
Basophils Absolute: 0 10*3/uL (ref 0.0–0.1)
Basophils Relative: 0.4 % (ref 0.0–3.0)
Eosinophils Absolute: 0.2 10*3/uL (ref 0.0–0.7)
Eosinophils Relative: 1.9 % (ref 0.0–5.0)
HCT: 48.4 % (ref 39.0–52.0)
Hemoglobin: 15.6 g/dL (ref 13.0–17.0)
Lymphocytes Relative: 36.4 % (ref 12.0–46.0)
Lymphs Abs: 3.5 10*3/uL (ref 0.7–4.0)
MCHC: 32.2 g/dL (ref 30.0–36.0)
MCV: 88.6 fl (ref 78.0–100.0)
Monocytes Absolute: 0.8 10*3/uL (ref 0.1–1.0)
Monocytes Relative: 8.7 % (ref 3.0–12.0)
Neutro Abs: 5.1 10*3/uL (ref 1.4–7.7)
Neutrophils Relative %: 52.6 % (ref 43.0–77.0)
Platelets: 286 10*3/uL (ref 150.0–400.0)
RBC: 5.47 Mil/uL (ref 4.22–5.81)
RDW: 13.3 % (ref 11.5–15.5)
WBC: 9.7 10*3/uL (ref 4.0–10.5)

## 2018-08-20 LAB — URINALYSIS, ROUTINE W REFLEX MICROSCOPIC
Bilirubin Urine: NEGATIVE
Hgb urine dipstick: NEGATIVE
Ketones, ur: NEGATIVE
Leukocytes,Ua: NEGATIVE
Nitrite: NEGATIVE
RBC / HPF: NONE SEEN (ref 0–?)
Specific Gravity, Urine: 1.015 (ref 1.000–1.030)
Total Protein, Urine: NEGATIVE
Urine Glucose: NEGATIVE
Urobilinogen, UA: 1 (ref 0.0–1.0)
WBC, UA: NONE SEEN (ref 0–?)
pH: 6.5 (ref 5.0–8.0)

## 2018-08-20 LAB — HEPATIC FUNCTION PANEL
ALT: 21 U/L (ref 0–53)
AST: 17 U/L (ref 0–37)
Albumin: 4.3 g/dL (ref 3.5–5.2)
Alkaline Phosphatase: 72 U/L (ref 39–117)
Bilirubin, Direct: 0.2 mg/dL (ref 0.0–0.3)
Total Bilirubin: 0.8 mg/dL (ref 0.2–1.2)
Total Protein: 7.4 g/dL (ref 6.0–8.3)

## 2018-08-20 LAB — BASIC METABOLIC PANEL
BUN: 13 mg/dL (ref 6–23)
CO2: 27 mEq/L (ref 19–32)
Calcium: 9.7 mg/dL (ref 8.4–10.5)
Chloride: 106 mEq/L (ref 96–112)
Creatinine, Ser: 1.08 mg/dL (ref 0.40–1.50)
GFR: 79.86 mL/min (ref >60.00)
Glucose, Bld: 78 mg/dL (ref 70–99)
Potassium: 4.1 mEq/L (ref 3.5–5.1)
Sodium: 139 mEq/L (ref 135–145)

## 2018-08-20 LAB — TSH: TSH: 0.96 u[IU]/mL (ref 0.35–4.50)

## 2018-08-20 LAB — PSA: PSA: 0 ng/mL — ABNORMAL LOW (ref 0.10–4.00)

## 2018-08-21 LAB — HEMOGLOBIN A1C: Hgb A1c MFr Bld: 5.6 % (ref 4.6–6.5)

## 2018-08-27 ENCOUNTER — Ambulatory Visit: Payer: Medicare Other | Admitting: Family Medicine

## 2018-09-11 ENCOUNTER — Ambulatory Visit: Payer: Medicare Other | Admitting: Family Medicine

## 2018-09-24 ENCOUNTER — Encounter: Payer: Self-pay | Admitting: Internal Medicine

## 2018-09-24 ENCOUNTER — Other Ambulatory Visit: Payer: Self-pay

## 2018-09-24 ENCOUNTER — Ambulatory Visit (INDEPENDENT_AMBULATORY_CARE_PROVIDER_SITE_OTHER)
Admission: RE | Admit: 2018-09-24 | Discharge: 2018-09-24 | Disposition: A | Discharge status: Home / Self Care | Payer: Medicare Other | Source: Ambulatory Visit | Attending: Internal Medicine | Admitting: Internal Medicine

## 2018-09-24 ENCOUNTER — Ambulatory Visit (INDEPENDENT_AMBULATORY_CARE_PROVIDER_SITE_OTHER): Payer: Medicare Other | Admitting: Internal Medicine

## 2018-09-24 VITALS — BP 118/78 | HR 90 | Temp 98.7°F | Ht 69.0 in | Wt 196.0 lb

## 2018-09-24 DIAGNOSIS — M5136 Other intervertebral disc degeneration, lumbar region: Secondary | ICD-10-CM

## 2018-09-24 DIAGNOSIS — R05 Cough: Secondary | ICD-10-CM | POA: Diagnosis not present

## 2018-09-24 DIAGNOSIS — Z0001 Encounter for general adult medical examination with abnormal findings: Secondary | ICD-10-CM

## 2018-09-24 DIAGNOSIS — M50322 Other cervical disc degeneration at C5-C6 level: Secondary | ICD-10-CM | POA: Diagnosis not present

## 2018-09-24 DIAGNOSIS — M50321 Other cervical disc degeneration at C4-C5 level: Secondary | ICD-10-CM | POA: Diagnosis not present

## 2018-09-24 DIAGNOSIS — M50323 Other cervical disc degeneration at C6-C7 level: Secondary | ICD-10-CM | POA: Diagnosis not present

## 2018-09-24 DIAGNOSIS — R7302 Impaired glucose tolerance (oral): Secondary | ICD-10-CM

## 2018-09-24 DIAGNOSIS — M5033 Other cervical disc degeneration, cervicothoracic region: Secondary | ICD-10-CM | POA: Diagnosis not present

## 2018-09-24 DIAGNOSIS — M542 Cervicalgia: Secondary | ICD-10-CM | POA: Diagnosis not present

## 2018-09-24 DIAGNOSIS — E7800 Pure hypercholesterolemia, unspecified: Secondary | ICD-10-CM

## 2018-09-24 DIAGNOSIS — R059 Cough, unspecified: Secondary | ICD-10-CM

## 2018-09-24 DIAGNOSIS — I10 Essential (primary) hypertension: Secondary | ICD-10-CM

## 2018-09-24 NOTE — Assessment & Plan Note (Signed)
stable overall by history and exam, recent data reviewed with pt, and pt to continue medical treatment as before,  to f/u any worsening symptoms or concerns  

## 2018-09-24 NOTE — Assessment & Plan Note (Signed)
Stable, for f/u lipids with labs

## 2018-09-24 NOTE — Assessment & Plan Note (Addendum)
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

## 2018-09-24 NOTE — Assessment & Plan Note (Signed)
Also for cxr,  to f/u any worsening symptoms or concerns 

## 2018-09-24 NOTE — Assessment & Plan Note (Signed)

## 2018-09-24 NOTE — Patient Instructions (Signed)
Please continue all other medications as before, and refills have been done if requested.  Please have the pharmacy call with any other refills you may need.  Please continue your efforts at being more active, low cholesterol diet, and weight control.  You are otherwise up to date with prevention measures today.  Please keep your appointments with your specialists as you may have planned  Please go to the XRAY Department in the Basement (go straight as you get off the elevator) for the x-ray testing  Please return in 6 months, or sooner if needed

## 2018-09-24 NOTE — Progress Notes (Signed)
Subjective:    Patient ID: Kevin Hale, male    DOB: November 29, 1940, 78 y.o.   MRN: [id number]  HPI  Here for wellness and f/u;  Overall doing ok;  Pt denies Chest pain, worsening SOB, DOE, wheezing, orthopnea, PND, worsening LE edema, palpitations, dizziness or syncope.  Pt denies neurological change such as new headache, facial or extremity weakness. . Pt states overall good compliance with treatment and medications, good tolerability, and has been trying to follow appropriate diet.  Pt denies worsening depressive symptoms, suicidal ideation or panic. No fever, night sweats, wt loss, loss of appetite, or other constitutional symptoms.  Pt states good ability with ADL's, has low fall risk, home safety reviewed and adequate, no other significant changes in hearing or vision, and only occasionally active with exercise. BP Readings from Last 3 Encounters:  09/24/18 118/78  08/14/18 140/74  03/26/18 124/84   Wt Readings from Last 3 Encounters:  09/24/18 196 lb (88.9 kg)  08/14/18 194 lb (88 kg)  03/26/18 197 lb (89.4 kg)  Does have several wks ongoing nasal allergy symptoms with clearish congestion, itch and sneezing and post nasal gtt cough, without fever, pain, ST, swelling or wheezing.   Pt denies polydipsia, polyuria, or low sugar symptoms such as weakness or confusion improved with po intake.  Pt states overall good compliance with meds, trying to follow lower cholesterol, diabetic diet, wt overall stable but little exercise however.   Also c/o mild intermittent right lower neck pain as well as right lower back  sharp x several wks but Pt denies bowel or bladder change, fever, wt loss,  worsening LE pain/numbness/weakness, gait change or falls. Worse to stand up, volt gel helps.   Past Medical History:  Diagnosis Date  . Anxiety   . Atherosclerosis of abdominal aorta ([place]) 11/12/2011  . BPH (benign prostatic hyperplasia)   . Bronchiectasis ([place]) 03/26/2018  . Cancer of prostate ([place])   .  Chronic anticoagulation 09/12/2016  . Coronary artery calcification seen on CT scan 03/26/2018  . Diverticulosis of colon   . DJD (degenerative joint disease)    left foot, "pt denies"  . Glucose intolerance (impaired glucose tolerance) 11/03/2010  . History of colon polyps   . History of DVT (deep vein thrombosis)   . HTN (hypertension)   . Hypercholesteremia   . Lumbar degenerative disc disease 11/12/2011  . Plantar fasciitis    Past Surgical History:  Procedure Laterality Date  . INSERTION PROSTATE RADIATION SEED    . UVULECTOMY      reports that he quit smoking about 10 months ago. His smoking use included cigarettes. He started smoking about 49 years ago. He has a 10.00 pack-year smoking history. He has never used smokeless tobacco. He reports that he does not drink alcohol or use drugs. family history includes Deep vein thrombosis in his mother; Diabetes in his mother; Healthy in his brother and brother. Allergies  Allergen Reactions  . Pravastatin Other (See Comments)    myalgia   Current Outpatient Medications on File Prior to Visit  Medication Sig Dispense Refill  . allopurinol (ZYLOPRIM) 100 MG tablet Take 1 tablet by mouth once daily 90 tablet 2  . b complex vitamins tablet Take 1 tablet by mouth daily.    . Cholecalciferol (VITAMIN D3) 1000 UNITS CAPS Take 1 Int'l Units by mouth daily.    . cilostazol (PLETAL) 50 MG tablet Take 1 tablet (50 mg total) by mouth 2 (two) times daily. 180 tablet 3  .  losartan (COZAAR) 25 MG tablet Take 1 tablet (25 mg total) by mouth 2 (two) times daily. 180 tablet 3  . simvastatin (ZOCOR) 20 MG tablet TAKE 1 TABLET BY MOUTH AT BEDTIME 90 tablet 3  . triamcinolone (NASACORT) 55 MCG/ACT AERO nasal inhaler Place 2 sprays into the nose daily. 1 Inhaler 12  . XARELTO 20 MG TABS tablet TAKE 1 TABLET BY MOUTH ONCE DAILY WITH SUPPER 30 tablet 5   No current facility-administered medications on file prior to visit.    Review of Systems  Constitutional: Negative for other unusual diaphoresis, sweats, appetite or weight changes HENT: Negative for other worsening hearing loss, ear pain, facial swelling, mouth sores or neck stiffness.   Eyes: Negative for other worsening pain, redness or other visual disturbance.  Respiratory: Negative for other stridor or swelling Cardiovascular: Negative for other palpitations or other chest pain  Gastrointestinal: Negative for worsening diarrhea or loose stools, blood in stool, distention or other pain Genitourinary: Negative for hematuria, flank pain or other change in urine volume.  Musculoskeletal: Negative for myalgias or other joint swelling.  Skin: Negative for other color change, or other wound or worsening drainage.  Neurological: Negative for other syncope or numbness. Hematological: Negative for other adenopathy or swelling Psychiatric/Behavioral: Negative for hallucinations, other worsening agitation, SI, self-injury, or new decreased concentration All other system neg per pt    Objective:   Physical Exam BP 118/78   Pulse 90   Temp 98.7 F (37.1 C) (Oral)   Ht 5\' 9"  (1.753 m)   Wt 196 lb (88.9 kg)   SpO2 97%   BMI 28.94 kg/m  VS noted,  Constitutional: Pt is oriented to person, place, and time. Appears well-developed and well-nourished, in no significant distress and comfortable Head: Normocephalic and atraumatic  Eyes: Conjunctivae and EOM are normal. Pupils are equal, round, and reactive to light Right Ear: External ear normal without discharge Left Ear: External ear normal without discharge Nose: Nose without discharge or deformity Bilat tm's with mild erythema.  Max sinus areas non tender.  Pharynx with mild erythema, no exudate  Mouth/Throat: Oropharynx is without other ulcerations and moist  Neck: Normal range of motion. Neck supple. No JVD present. No tracheal deviation present or significant neck LA or mass Cardiovascular: Normal rate, regular rhythm, normal  heart sounds and intact distal pulses.   Pulmonary/Chest: WOB normal and breath sounds without rales or wheezing  Abdominal: Soft. Bowel sounds are normal. NT. No HSM  Musculoskeletal: Normal range of motion. Exhibits no edema Lymphadenopathy: Has no other cervical adenopathy.  Neurological: Pt is alert and oriented to person, place, and time. Pt has normal reflexes. No cranial nerve deficit. Motor grossly intact, Gait intact Skin: Skin is warm and dry. No rash noted or new ulcerations Psychiatric:  Has normal mood and affect. Behavior is normal without agitation No other exam findings  Lab Results  Component Value Date   WBC 9.7 08/20/2018   HGB 15.6 08/20/2018   HCT 48.4 08/20/2018   PLT 286.0 08/20/2018   GLUCOSE 78 08/20/2018   CHOL 116 08/20/2018   TRIG 84.0 08/20/2018   HDL 50.60 08/20/2018   LDLCALC 48 08/20/2018   ALT 21 08/20/2018   AST 17 08/20/2018   NA 139 08/20/2018   K 4.1 08/20/2018   CL 106 08/20/2018   CREATININE 1.08 08/20/2018   BUN 13 08/20/2018   CO2 27 08/20/2018   TSH 0.96 08/20/2018   PSA 0.00 (L) 08/20/2018   INR 2.1 05/28/2017  HGBA1C 5.6 08/20/2018       Assessment & Plan:

## 2018-10-30 ENCOUNTER — Other Ambulatory Visit: Payer: Self-pay

## 2018-10-30 ENCOUNTER — Ambulatory Visit (INDEPENDENT_AMBULATORY_CARE_PROVIDER_SITE_OTHER): Payer: Medicare Other

## 2018-10-30 DIAGNOSIS — Z23 Encounter for immunization: Secondary | ICD-10-CM | POA: Diagnosis not present

## 2018-10-31 ENCOUNTER — Other Ambulatory Visit: Payer: Self-pay | Admitting: Internal Medicine

## 2018-11-15 ENCOUNTER — Other Ambulatory Visit: Payer: Self-pay | Admitting: Internal Medicine

## 2018-11-17 IMAGING — CT CT CHEST HIGH RESOLUTION W/O CM
2 of 5 series · 15 of 36 positions shown, 18 images · non-contrast
Comparison: 07/19/2017 chest radiograph. 03/09/2010 chest CT
angiogram.

CLINICAL DATA: Chronic mildly productive cough for 4 months.
History of prostate cancer.

EXAM:
CT CHEST WITHOUT CONTRAST
TECHNIQUE: Multidetector CT imaging of the chest was performed following the
standard protocol without intravenous contrast. High resolution
imaging of the lungs, as well as inspiratory and expiratory imaging,
was performed.

[Series 4: high resolution · axial · 0.74mm/px · z∈[+1306,+1616]mm · 12 of 171 slices shown, 15 images]
[im 8/171  mediastinal]
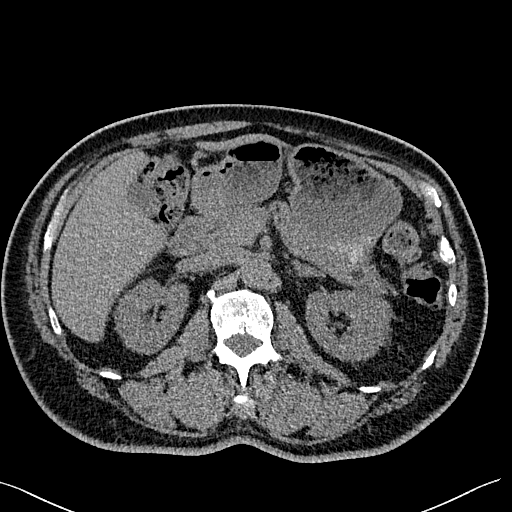
[im 8/171  lung]
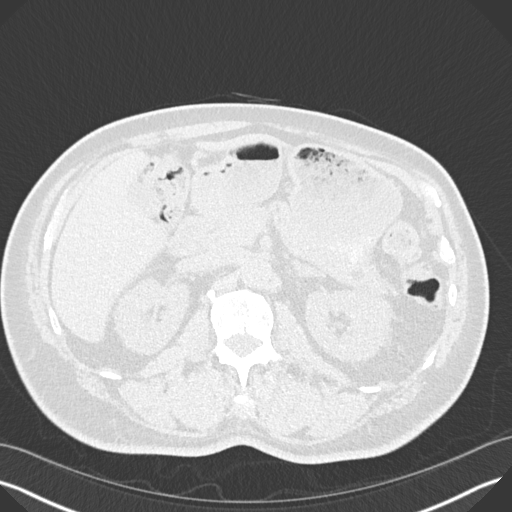
[im 23/171  lung]
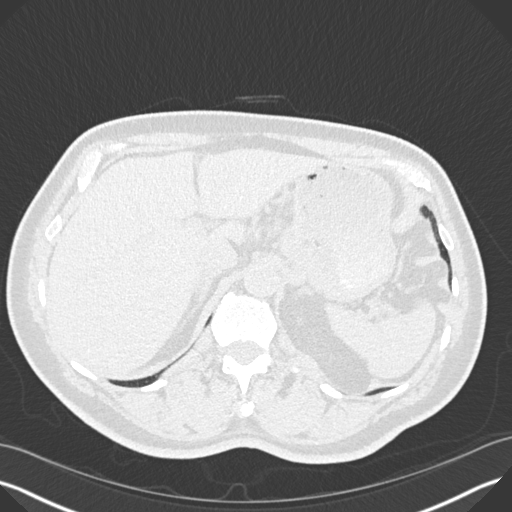
[im 37/171  lung]
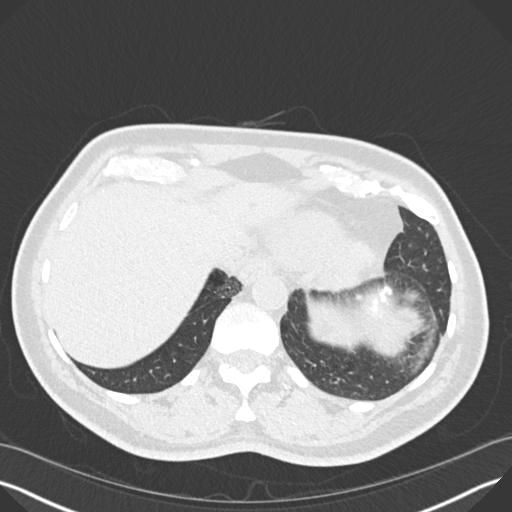
[im 52/171  lung]
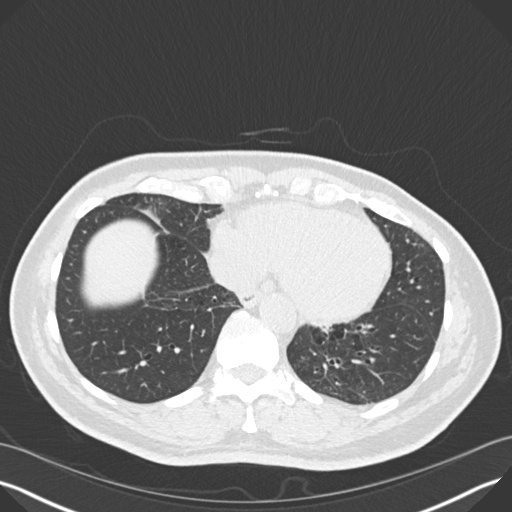
[im 67/171  mediastinal]
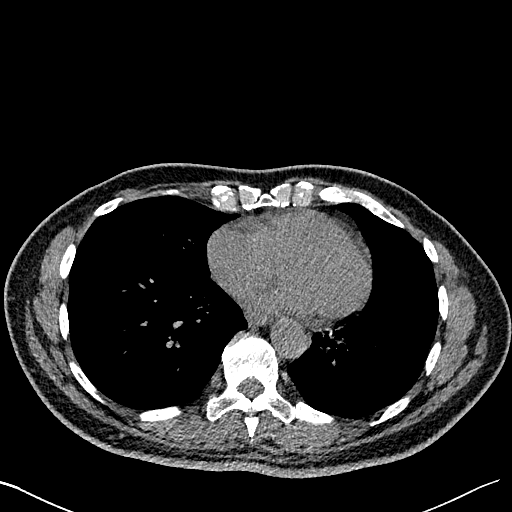
[im 67/171  lung]
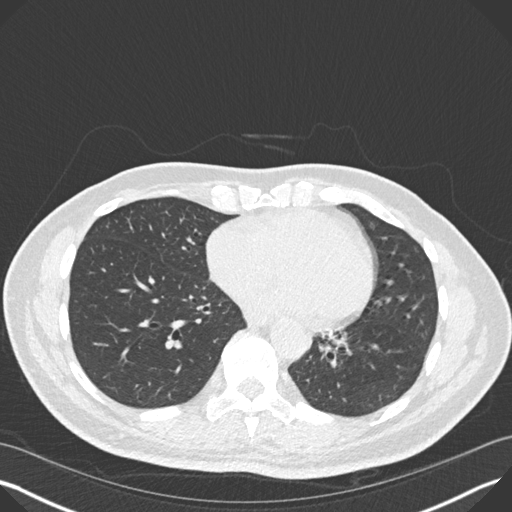
[im 82/171  lung]
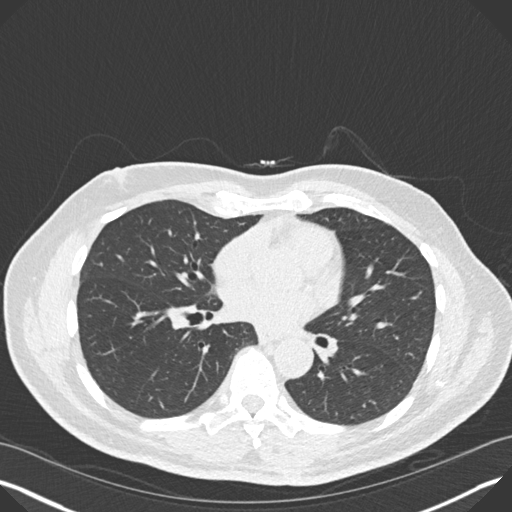
[im 89/171  lung]
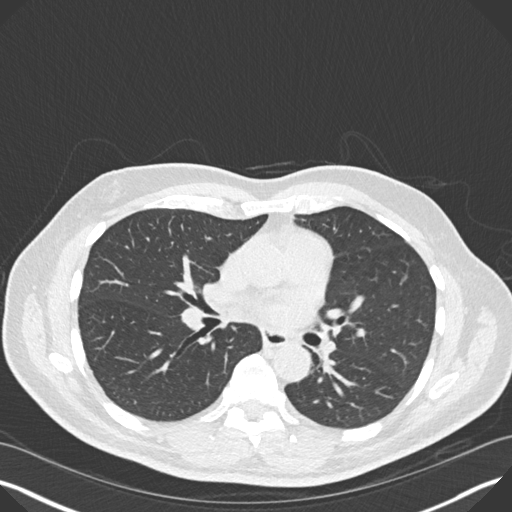
[im 104/171  lung]
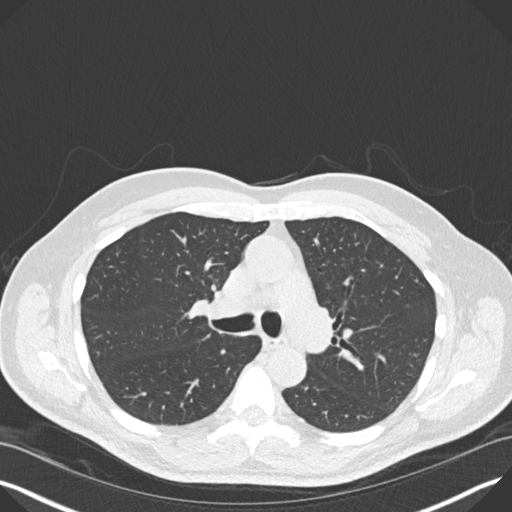
[im 119/171  mediastinal]
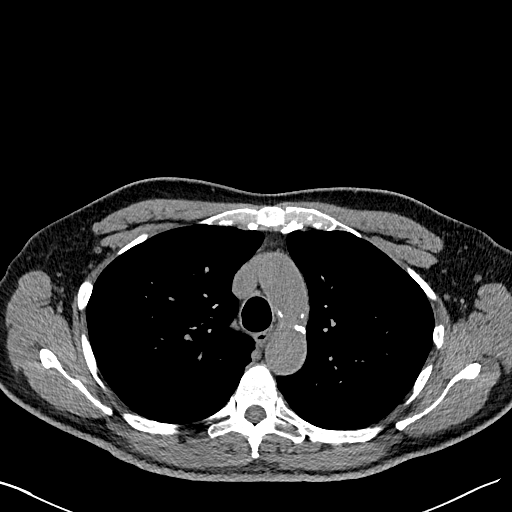
[im 119/171  lung]
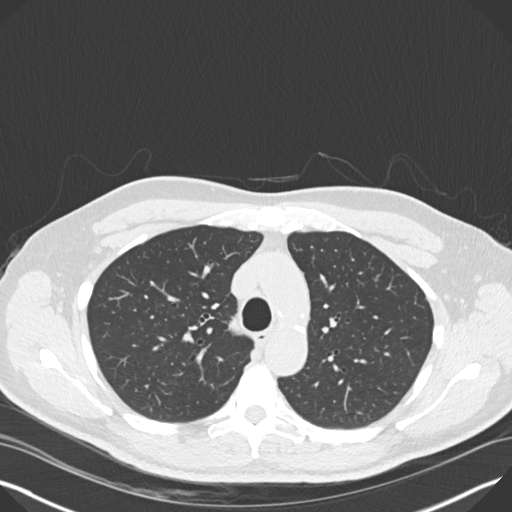
[im 134/171  lung]
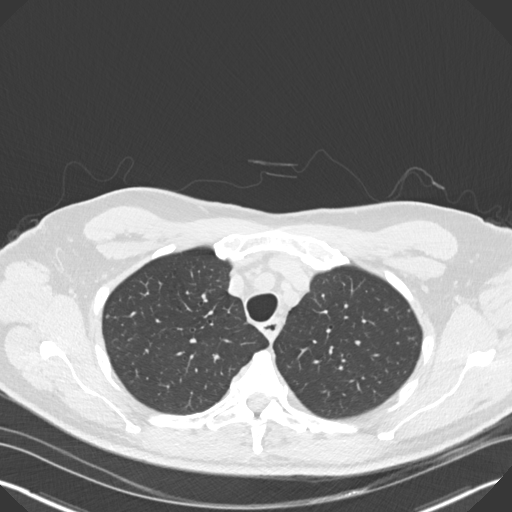
[im 148/171  lung]
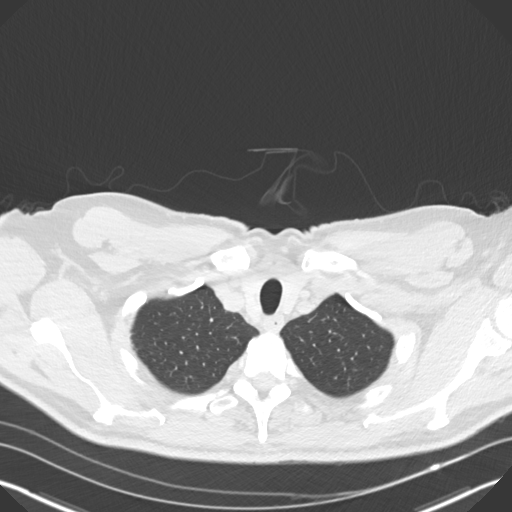
[im 163/171  lung]
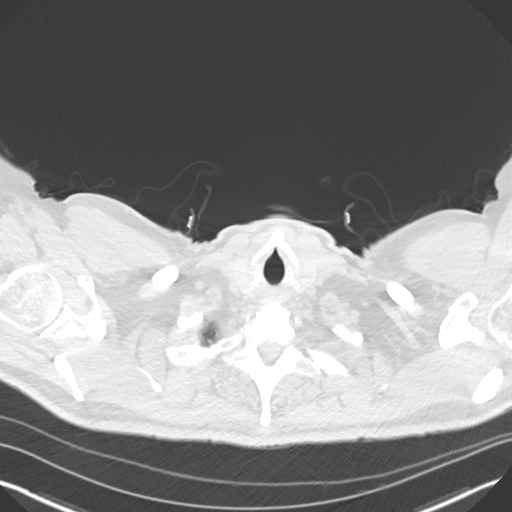

[Series 7: coronal · coronal · 0.66mm/px · 3 of 151 slices shown]
[im 31/151  lung]
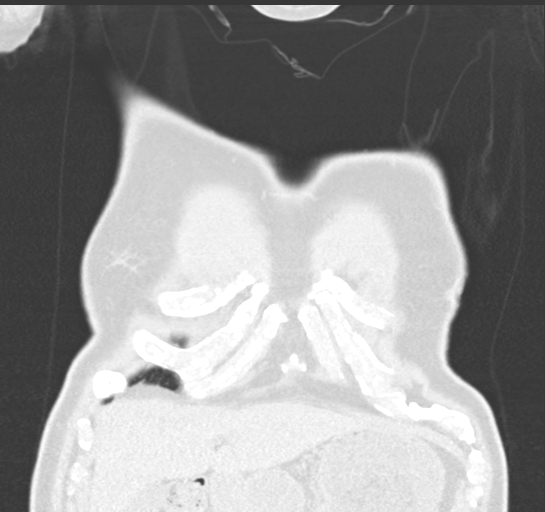
[im 61/151  lung]
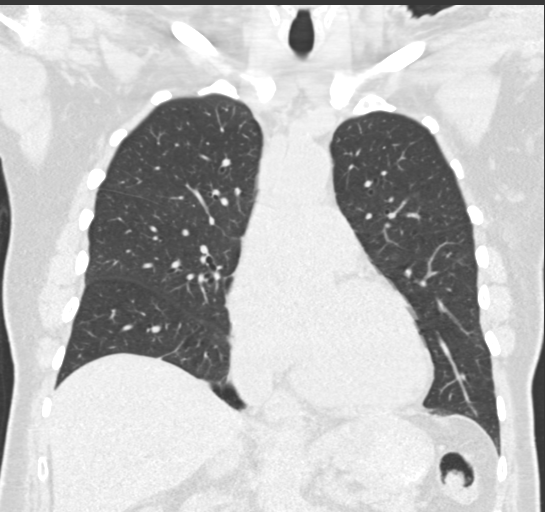
[im 91/151  lung]
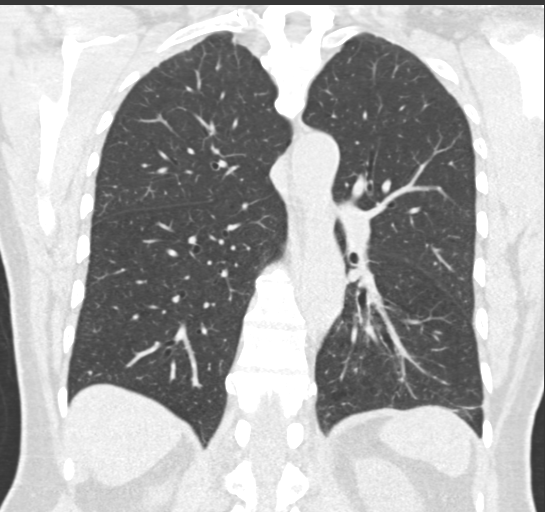

[15 of 36 positions shown; findings below may reference images not displayed]

FINDINGS: Cardiovascular: Normal heart size. No significant pericardial
effusion/thickening. Left anterior descending and left circumflex
coronary atherosclerosis. Atherosclerotic nonaneurysmal thoracic
aorta. Normal caliber pulmonary arteries.

Mediastinum/Nodes: No discrete thyroid nodules. Unremarkable
esophagus. No pathologically enlarged axillary, mediastinal or hilar
lymph nodes, noting limited sensitivity for the detection of hilar
adenopathy on this noncontrast study. Tiny granulomatous calcified
right hilar node.

Lungs/Pleura: No pneumothorax. No pleural effusion. Small calcified
pleural plaque along the left hemidiaphragm (series 7/image 76). No
right sided calcified pleural plaques. No acute consolidative
airspace disease or lung masses. There is mild cylindrical
bronchiectasis scattered in the left lower lobe, medial right lower
lobe and inferior right middle lobe. There is associated mild patchy
tree-in-bud opacity at the areas of bronchiectasis, most prominent
in the left lower lobe. These findings appear new since 8448 chest
CT. No significant regions of subpleural reticulation, architectural
distortion or frank honeycombing. Mild patchy air trapping in both
lungs on the expiration sequence.

Upper abdomen: Small hiatal hernia. Subcentimeter hypodense
scattered liver lesions, too small to characterize, unchanged,
considered benign.

Musculoskeletal: No aggressive appearing focal osseous lesions. Mild
thoracic spondylosis.
IMPRESSION: 1. Mild cylindrical bronchiectasis and mild patchy tree-in-bud
opacities in the mid to lower lungs, most prominent in the left
lower lobe, new since 8448 chest CT. Consider atypical mycobacterial
infection (BRUTUS) or recurrent aspiration.
2. Mild patchy air trapping in both lungs, indicative of small
airways disease.
3. Small calcified pleural plaque along the left hemidiaphragm
without pleural effusion. Unilateral calcified pleural plaque is
nonspecific and probably due to remote infectious or inflammatory
insult. Asbestos related pleural disease is not excluded.
4. Two vessel coronary atherosclerosis.
5. Small hiatal hernia.

Aortic Atherosclerosis ([id number]-[id number]).

## 2018-11-17 NOTE — Telephone Encounter (Signed)
26m 88.9kg Scr 1.08 08/20/18 ccr 70.64mlmin Lovw/ross 01/20/18

## 2018-11-18 ENCOUNTER — Encounter: Payer: Self-pay | Admitting: Internal Medicine

## 2018-11-18 ENCOUNTER — Ambulatory Visit (INDEPENDENT_AMBULATORY_CARE_PROVIDER_SITE_OTHER): Payer: Medicare Other | Admitting: Internal Medicine

## 2018-11-18 ENCOUNTER — Other Ambulatory Visit: Payer: Self-pay

## 2018-11-18 ENCOUNTER — Telehealth: Payer: Self-pay | Admitting: Emergency Medicine

## 2018-11-18 VITALS — BP 124/78 | HR 101 | Temp 98.5°F | Ht 69.0 in | Wt 198.0 lb

## 2018-11-18 DIAGNOSIS — R7302 Impaired glucose tolerance (oral): Secondary | ICD-10-CM | POA: Diagnosis not present

## 2018-11-18 DIAGNOSIS — I10 Essential (primary) hypertension: Secondary | ICD-10-CM

## 2018-11-18 DIAGNOSIS — R059 Cough, unspecified: Secondary | ICD-10-CM

## 2018-11-18 DIAGNOSIS — R05 Cough: Secondary | ICD-10-CM | POA: Diagnosis not present

## 2018-11-18 DIAGNOSIS — R062 Wheezing: Secondary | ICD-10-CM

## 2018-11-18 MED ORDER — METHYLPREDNISOLONE 4 MG PO TBPK: ORAL_TABLET | ORAL | 0 refills | Status: DC

## 2018-11-18 MED ORDER — BUDESONIDE-FORMOTEROL FUMARATE 80-4.5 MCG/ACT IN AERO: 2.0000 | INHALATION_SPRAY | Freq: Two times a day (BID) | RESPIRATORY_TRACT | 11 refills | Status: DC

## 2018-11-18 MED ORDER — METHYLPREDNISOLONE ACETATE 80 MG/ML IJ SUSP
80.0000 mg | Freq: Once | INTRAMUSCULAR | Status: AC
  Administered 2018-11-18: 80 mg via INTRAMUSCULAR

## 2018-11-18 MED ORDER — TRAMADOL HCL 50 MG PO TABS: 50.0000 mg | ORAL_TABLET | Freq: Four times a day (QID) | ORAL | 0 refills | Status: DC | PRN

## 2018-11-18 NOTE — Assessment & Plan Note (Addendum)
I suspect bronchospastic likely copd exacerbation or asthma related despite the recent PFTs, and likely accounts for the persistent cough.  For depomedrol IM 80, predpac asd, and symbicort asd,  to f/u any worsening symptoms or concerns  Note:  Total time for pt hx, exam, review of record with pt in the room, determination of diagnoses and plan for further eval and tx is > 40 min, with over 50% spent in coordination and counseling of patient including the differential dx, tx, further evaluation and other management of wheezing, cough, hyperglycemia, HTN

## 2018-11-18 NOTE — Telephone Encounter (Signed)
Pt would like to come into the office today to be seen, pt is c/o cough and aching all over. States that this cough has been going on for over a year as well as the body aches. Please advise if pt can be seen in the office. Not able to do virtual.

## 2018-11-18 NOTE — Patient Instructions (Signed)
You had the steroid shot today  Please take all new medication as prescribed - the medrol steroid medication until finished, as well as the inhaler twice per day  Please take all new medication as prescribed - the pain medication as needed  Please consider seeing Dr Tamala Julian in the office for the shoulders and back pain  Please continue all other medications as before, and refills have been done if requested.  Please have the pharmacy call with any other refills you may need.  Please continue your efforts at being more active, low cholesterol diet, and weight control..  Please keep your appointments with your specialists as you may have planned

## 2018-11-18 NOTE — Telephone Encounter (Signed)
Ok with me 

## 2018-11-18 NOTE — Progress Notes (Signed)
Subjective:    Patient ID: Kevin Hale, male    DOB: April 03, 1940, 78 y.o.   MRN: [id number]  HPI  Here to f/u; overall doing ok,  Pt denies chest pain, increasing sob or doe, wheezing, orthopnea, PND, increased LE swelling, palpitations, dizziness or syncope.  Pt denies new neurological symptoms such as new headache, or facial or extremity weakness or numbness.  Pt denies polydipsia, polyuria, or low sugar episode.  Pt states overall good compliance with meds.  Also c/o chronic cough, slight productive whitish for several months,  Pt denies fever, wt loss, night sweats, loss of appetite, or other constitutional symptoms  Only his PE exam today does he admit also to wheezing, mild sob, apparently not thinking it important.  Also c/o "body aches" which more specifically consists of low back pain, mild, intermittent midline also bilateral but Pt denies bowel or bladder change, fever, wt loss,  worsening LE pain/numbness/weakness, gait change or falls.  Also c/o right shoulder pain, has already had cortisone which helped a few months ago, but mild worsening again in the past 2 wks.  Also has hx of significant cervical spondylosis but no recent worsening neck pain.  No hx of CHF, echo with normal EF aug 2018.  Has been long time smoker.  PFTs  - 11/26/17- Pre-and post spirometry>>> normal spirometry, FVC 3.350 (94% predicted), postbronchodilator ratio 79, slight mid flow reversibility after bronchodilator response, no significant bronchodilator response from FVC or ratio     CXR neg for acute 09/25/2018 Past Medical History:  Diagnosis Date  . Anxiety   . Atherosclerosis of abdominal aorta ([place]) 11/12/2011  . BPH (benign prostatic hyperplasia)   . Bronchiectasis ([place]) 03/26/2018  . Cancer of prostate ([place])   . Chronic anticoagulation 09/12/2016  . Coronary artery calcification seen on CT scan 03/26/2018  . Diverticulosis of colon   . DJD (degenerative joint disease)    left foot, "pt denies"  . Glucose  intolerance (impaired glucose tolerance) 11/03/2010  . History of colon polyps   . History of DVT (deep vein thrombosis)   . HTN (hypertension)   . Hypercholesteremia   . Lumbar degenerative disc disease 11/12/2011  . Plantar fasciitis    Past Surgical History:  Procedure Laterality Date  . INSERTION PROSTATE RADIATION SEED    . UVULECTOMY      reports that he quit smoking about a year ago. His smoking use included cigarettes. He started smoking about 49 years ago. He has a 10.00 pack-year smoking history. He has never used smokeless tobacco. He reports that he does not drink alcohol or use drugs. family history includes Deep vein thrombosis in his mother; Diabetes in his mother; Healthy in his brother and brother. Allergies  Allergen Reactions  . Pravastatin Other (See Comments)    myalgia   Current Outpatient Medications on File Prior to Visit  Medication Sig Dispense Refill  . allopurinol (ZYLOPRIM) 100 MG tablet Take 1 tablet by mouth once daily 90 tablet 2  . b complex vitamins tablet Take 1 tablet by mouth daily.    . Cholecalciferol (VITAMIN D3) 1000 UNITS CAPS Take 1 Int'l Units by mouth daily.    . cilostazol (PLETAL) 50 MG tablet Take 1 tablet (50 mg total) by mouth 2 (two) times daily. 180 tablet 3  . losartan (COZAAR) 25 MG tablet Take 1 tablet (25 mg total) by mouth 2 (two) times daily. 180 tablet 3  . simvastatin (ZOCOR) 20 MG tablet TAKE 1 TABLET BY  MOUTH AT BEDTIME 90 tablet 0  . triamcinolone (NASACORT) 55 MCG/ACT AERO nasal inhaler Place 2 sprays into the nose daily. 1 Inhaler 12  . XARELTO 20 MG TABS tablet TAKE 1 TABLET BY MOUTH ONCE DAILY WITH SUPPER 90 tablet 1   No current facility-administered medications on file prior to visit.    Review of Systems  Constitutional: Negative for other unusual diaphoresis or sweats HENT: Negative for ear discharge or swelling Eyes: Negative for other worsening visual disturbances Respiratory: Negative for stridor or other  swelling  Gastrointestinal: Negative for worsening distension or other blood Genitourinary: Negative for retention or other urinary change Musculoskeletal: Negative for other MSK pain or swelling Skin: Negative for color change or other new lesions Neurological: Negative for worsening tremors and other numbness  Psychiatric/Behavioral: Negative for worsening agitation or other fatigue All otherwise neg per pt    Objective:   Physical Exam BP 124/78   Pulse (!) 101   Temp 98.5 F (36.9 C) (Oral)   Ht 5\' 9"  (1.753 m)   Wt 198 lb (89.8 kg)   SpO2 96%   BMI 29.24 kg/m  VS noted,  Constitutional: Pt appears in NAD HENT: Head: NCAT.  Right Ear: External ear normal.  Left Ear: External ear normal.  Eyes: . Pupils are equal, round, and reactive to light. Conjunctivae and EOM are normal Nose: without d/c or deformity Neck: Neck supple. Gross normal ROM Cardiovascular: Normal rate and regular rhythm.   Pulmonary/Chest: Effort normal and breath sounds decreased without rales but with few mild insp wheezing.  Abd:  Soft, NT, ND, + BS, no organomegaly Neurological: Pt is alert. At baseline orientation, motor grossly intact Skin: Skin is warm. No rashes, other new lesions, no LE edema Psychiatric: Pt behavior is normal without agitation  No other exam findings Lab Results  Component Value Date   WBC 9.7 08/20/2018   HGB 15.6 08/20/2018   HCT 48.4 08/20/2018   PLT 286.0 08/20/2018   GLUCOSE 78 08/20/2018   CHOL 116 08/20/2018   TRIG 84.0 08/20/2018   HDL 50.60 08/20/2018   LDLCALC 48 08/20/2018   ALT 21 08/20/2018   AST 17 08/20/2018   NA 139 08/20/2018   K 4.1 08/20/2018   CL 106 08/20/2018   CREATININE 1.08 08/20/2018   BUN 13 08/20/2018   CO2 27 08/20/2018   TSH 0.96 08/20/2018   PSA 0.00 (L) 08/20/2018   INR 2.1 05/28/2017   HGBA1C 5.6 08/20/2018       Assessment & Plan:

## 2018-11-18 NOTE — Assessment & Plan Note (Signed)
stable overall by history and exam, recent data reviewed with pt, and pt to continue medical treatment as before,  to f/u any worsening symptoms or concerns  

## 2018-11-18 NOTE — Assessment & Plan Note (Signed)
For tx as above 

## 2018-11-18 NOTE — Telephone Encounter (Signed)
Spoke with pt to schedule appt.

## 2018-11-21 ENCOUNTER — Other Ambulatory Visit: Payer: Self-pay | Admitting: Family Medicine

## 2018-11-25 NOTE — Telephone Encounter (Signed)
Left message for patient to see if he was still taking this medication.

## 2018-11-26 NOTE — Telephone Encounter (Signed)
° °  Pt call to say he is still taking the medicine   cilostazol (PLETAL) 50 MG tablet

## 2018-12-04 NOTE — Progress Notes (Signed)
Cardiology Office Note   Date:  12/05/2018   ID:  Kevin Hale, DOB 1940/03/20, MRN [id number]  PCP:  Biagio Borg, MD  Cardiologist:   Dorris Carnes, MD   Pt presents for f/u of DVT    History of Present Illness: Kevin Hale is a 78 y.o. male with a history of chronic LE DVT and PE  Echo in 2012 showed normal LVEF and RVEF   I saw him in Nov 2018    He was seen by Kathleen Argue in the interval   In NOv 2019  The pt says his breating is OK   He denies CP     Outpatient Medications Prior to Visit  Medication Sig Dispense Refill  . allopurinol (ZYLOPRIM) 100 MG tablet Take 1 tablet by mouth once daily 90 tablet 2  . b complex vitamins tablet Take 1 tablet by mouth daily.    . budesonide-formoterol (SYMBICORT) 80-4.5 MCG/ACT inhaler Inhale 2 puffs into the lungs 2 (two) times daily. 1 Inhaler 11  . Cholecalciferol (VITAMIN D3) 1000 UNITS CAPS Take 1 Int'l Units by mouth daily.    Marland Kitchen losartan (COZAAR) 25 MG tablet Take 1 tablet (25 mg total) by mouth 2 (two) times daily. 180 tablet 3  . simvastatin (ZOCOR) 20 MG tablet TAKE 1 TABLET BY MOUTH AT BEDTIME 90 tablet 0  . traMADol (ULTRAM) 50 MG tablet Take 1 tablet (50 mg total) by mouth every 6 (six) hours as needed. 30 tablet 0  . triamcinolone (NASACORT) 55 MCG/ACT AERO nasal inhaler Place 2 sprays into the nose daily. 1 Inhaler 12  . XARELTO 20 MG TABS tablet TAKE 1 TABLET BY MOUTH ONCE DAILY WITH SUPPER 90 tablet 1  . cilostazol (PLETAL) 50 MG tablet Take 1 tablet by mouth twice daily (Patient not taking: Reported on 12/05/2018) 180 tablet 0  . methylPREDNISolone (MEDROL DOSEPAK) 4 MG TBPK tablet 4 tab by mouth x 3day, 2 tab x 3day,1 tab x 3 day (Patient not taking: Reported on 12/05/2018) 21 tablet 0   No facility-administered medications prior to visit.      Allergies:   Pravastatin   Past Medical History:  Diagnosis Date  . Anxiety   . Atherosclerosis of abdominal aorta ([place]) 11/12/2011  . BPH (benign prostatic hyperplasia)   .  Bronchiectasis ([place]) 03/26/2018  . Cancer of prostate ([place])   . Chronic anticoagulation 09/12/2016  . Coronary artery calcification seen on CT scan 03/26/2018  . Diverticulosis of colon   . DJD (degenerative joint disease)    left foot, "pt denies"  . Glucose intolerance (impaired glucose tolerance) 11/03/2010  . History of colon polyps   . History of DVT (deep vein thrombosis)   . HTN (hypertension)   . Hypercholesteremia   . Lumbar degenerative disc disease 11/12/2011  . Plantar fasciitis     Past Surgical History:  Procedure Laterality Date  . INSERTION PROSTATE RADIATION SEED    . UVULECTOMY       Social History:  The patient  reports that he quit smoking about a year ago. His smoking use included cigarettes. He started smoking about 49 years ago. He has a 10.00 pack-year smoking history. He has never used smokeless tobacco. He reports that he does not drink alcohol or use drugs.   Family History:  The patient's family history includes Deep vein thrombosis in his mother; Diabetes in his mother; Healthy in his brother and brother.    ROS:  Please see the history  of present illness. All other systems are reviewed and  Negative to the above problem except as noted.    PHYSICAL EXAM: VS:  BP 132/68   Pulse 90   Ht 5\' 9"  (1.753 m)   Wt 197 lb (89.4 kg)   BMI 29.09 kg/m   GEN: Overweight 78 yo  in no acute distress  HEENT: normal  Neck: no JVD, carotid bruits, or masses Cardiac: RRR; no murmurs, rubs, or gallops,  Tr edema  Respiratory:   Relatively clear   GI: soft, nontender, nondistended, + BS  No hepatomegaly  MS: no deformity Moving all extremities   Skin: warm and dry, no rash Neuro:  Strength and sensation are intact Psych: euthymic mood, full affect   EKG:  EKG is ordered today.   SR 91 bpm   LVH   LAFB     Lipid Panel    Component Value Date/Time   CHOL 116 08/20/2018 1204   TRIG 84.0 08/20/2018 1204   HDL 50.60 08/20/2018 1204   CHOLHDL 2 08/20/2018 1204    VLDL 16.8 08/20/2018 1204   LDLCALC 48 08/20/2018 1204      Wt Readings from Last 3 Encounters:  12/05/18 197 lb (89.4 kg)  11/18/18 198 lb (89.8 kg)  09/24/18 196 lb (88.9 kg)      ASSESSMENT AND PLAN: 1/  Hx of DVT/PE  Currently on Xarelto   COntinue       3  HTN  BP is OK on current regimen      4  PVOD  Atherosclerosis of aorta No claudication    5  HL   Conitnue  statin   LDL was 48 in June 2020  COntiue    F/U in 12 months     Stay active     Current medicines are reviewed at length with the patient today.  The patient does not have concerns regarding medicines.  Signed, Dorris Carnes, MD  12/05/2018 3:51 PM    [facility] [place], [place], [place]  [address] Phone: [phone]; Fax: [phone]

## 2018-12-05 ENCOUNTER — Other Ambulatory Visit: Payer: Self-pay

## 2018-12-05 ENCOUNTER — Encounter: Payer: Self-pay | Admitting: Internal Medicine

## 2018-12-05 ENCOUNTER — Ambulatory Visit: Payer: Medicare Other | Admitting: Internal Medicine

## 2018-12-05 VITALS — BP 132/68 | HR 90 | Ht 69.0 in | Wt 197.0 lb

## 2018-12-05 DIAGNOSIS — I10 Essential (primary) hypertension: Secondary | ICD-10-CM

## 2018-12-05 NOTE — Patient Instructions (Signed)
Medication Instructions:  No changes If you need a refill on your cardiac medications before your next appointment, please call your pharmacy.   Lab work: None If you have labs (blood work) drawn today and your tests are completely normal, you will receive your results only by: Marland Kitchen MyChart Message (if you have MyChart) OR . A paper copy in the mail If you have any lab test that is abnormal or we need to change your treatment, we will call you to review the results.  Testing/Procedures: None   Follow-Up: At [facility], you and your health needs are our priority.  As part of our continuing mission to provide you with exceptional heart care, we have created designated Provider Care Teams.  These Care Teams include your primary Cardiologist (physician) and Advanced Practice Providers (APPs -  Physician Assistants and Nurse Practitioners) who all work together to provide you with the care you need, when you need it. You will need a follow up appointment in:  12 months.  Please call our office 2 months in advance to schedule this appointment.  You may see Dorris Carnes, MD or one of the following Advanced Practice Providers on your designated Care Team: Richardson Dopp, PA-C Royal Kun[place], [place] . Daune Perch, NP  Any Other Special Instructions Will Be Listed Below (If Applicable).

## 2019-02-11 ENCOUNTER — Other Ambulatory Visit: Payer: Self-pay | Admitting: Internal Medicine

## 2019-02-17 ENCOUNTER — Other Ambulatory Visit: Payer: Self-pay | Admitting: Internal Medicine

## 2019-02-18 DIAGNOSIS — R3915 Urgency of urination: Secondary | ICD-10-CM | POA: Diagnosis not present

## 2019-03-17 ENCOUNTER — Other Ambulatory Visit: Payer: Self-pay | Admitting: Internal Medicine

## 2019-03-20 DIAGNOSIS — Z961 Presence of intraocular lens: Secondary | ICD-10-CM | POA: Diagnosis not present

## 2019-03-20 DIAGNOSIS — H40003 Preglaucoma, unspecified, bilateral: Secondary | ICD-10-CM | POA: Diagnosis not present

## 2019-03-20 DIAGNOSIS — D3101 Benign neoplasm of right conjunctiva: Secondary | ICD-10-CM | POA: Diagnosis not present

## 2019-03-26 ENCOUNTER — Ambulatory Visit (INDEPENDENT_AMBULATORY_CARE_PROVIDER_SITE_OTHER): Payer: Medicare Other

## 2019-03-26 ENCOUNTER — Other Ambulatory Visit: Payer: Self-pay

## 2019-03-26 ENCOUNTER — Encounter: Payer: Self-pay | Admitting: Family Medicine

## 2019-03-26 ENCOUNTER — Ambulatory Visit: Payer: Medicare Other | Admitting: Family Medicine

## 2019-03-26 VITALS — BP 130/86 | HR 76 | Ht 69.0 in | Wt 197.0 lb

## 2019-03-26 DIAGNOSIS — M7521 Bicipital tendinitis, right shoulder: Secondary | ICD-10-CM

## 2019-03-26 DIAGNOSIS — M25511 Pain in right shoulder: Secondary | ICD-10-CM | POA: Diagnosis not present

## 2019-03-26 DIAGNOSIS — G8929 Other chronic pain: Secondary | ICD-10-CM

## 2019-03-26 MED ORDER — GABAPENTIN 100 MG PO CAPS: 100.0000 mg | ORAL_CAPSULE | Freq: Every day | ORAL | 0 refills | Status: DC

## 2019-03-26 NOTE — Progress Notes (Signed)
[place] Phone: [phone] Subjective:   [name], am serving as a scribe for Dr. Hulan Saas. This visit occurred during the SARS-CoV-2 public health emergency.  Safety protocols were in place, including screening questions prior to the visit, additional usage of staff PPE, and extensive cleaning of exam room while observing appropriate contact time as indicated for disinfecting solutions.    I'm seeing this patient by the request  of:  Biagio Borg, MD  CC: Right shoulder pain follow-up  [id number]   08/14/2018 Patient given injection and tolerated the procedure well.  Discussed icing regimen and home exercises.  Discussed compression and over-the-counter topical anti-inflammatories.  Patient is to increase activity follow-up again in 4 to 6 weeks  Update 03/26/2019 YOHAAN Hale is a 79 y.o. male coming in with complaint of right shoulder pain. Pain for past 4 months. Feels that the last injection did not last as long. Anterior shoulder pain that constant. Increases with flexion and IR. Denies any radiating symptoms.       Past Medical History:  Diagnosis Date  . Anxiety   . Atherosclerosis of abdominal aorta ([place]) 11/12/2011  . BPH (benign prostatic hyperplasia)   . Bronchiectasis ([place]) 03/26/2018  . Cancer of prostate ([place])   . Chronic anticoagulation 09/12/2016  . Coronary artery calcification seen on CT scan 03/26/2018  . Diverticulosis of colon   . DJD (degenerative joint disease)    left foot, "pt denies"  . Glucose intolerance (impaired glucose tolerance) 11/03/2010  . History of colon polyps   . History of DVT (deep vein thrombosis)   . HTN (hypertension)   . Hypercholesteremia   . Lumbar degenerative disc disease 11/12/2011  . Plantar fasciitis    Past Surgical History:  Procedure Laterality Date  . INSERTION PROSTATE RADIATION SEED    . UVULECTOMY     Social History    Socioeconomic History  . Marital status: Married    Spouse name: Not on file  . Number of children: 2  . Years of education: Not on file  . Highest education level: Not on file  Occupational History  . Occupation: retired  Tobacco Use  . Smoking status: Former Smoker    Packs/day: 0.25    Years: 40.00    Pack years: 10.00    Types: Cigarettes    Start date: 02/26/1969    Quit date: 11/23/2017    Years since quitting: 1.3  . Smokeless tobacco: Never Used  Substance and Sexual Activity  . Alcohol use: No  . Drug use: No  . Sexual activity: Not Currently  Other Topics Concern  . Not on file  Social History Narrative  . Not on file   Social Determinants of Health   Financial Resource Strain:   . Difficulty of Paying Living Expenses: Not on file  Food Insecurity:   . Worried About Charity fundraiser in the Last Year: Not on file  . Ran Out of Food in the Last Year: Not on file  Transportation Needs:   . Lack of Transportation (Medical): Not on file  . Lack of Transportation (Non-Medical): Not on file  Physical Activity:   . Days of Exercise per Week: Not on file  . Minutes of Exercise per Session: Not on file  Stress:   . Feeling of Stress : Not on file  Social Connections:   . Frequency of Communication with Friends and Family: Not on  file  . Frequency of Social Gatherings with Friends and Family: Not on file  . Attends Religious Services: Not on file  . Active Member of Clubs or Organizations: Not on file  . Attends Archivist Meetings: Not on file  . Marital Status: Not on file   Allergies  Allergen Reactions  . Pravastatin Other (See Comments)    myalgia   Family History  Problem Relation Age of Onset  . Deep vein thrombosis Mother   . Diabetes Mother   . Healthy Brother   . Healthy Brother   . Colon cancer Neg Hx   . Rectal cancer Neg Hx      Current Outpatient Medications (Cardiovascular):  .  losartan (COZAAR) 25 MG tablet, Take 1  tablet by mouth twice daily .  simvastatin (ZOCOR) 20 MG tablet, TAKE 1 TABLET BY MOUTH AT BEDTIME  Current Outpatient Medications (Respiratory):  .  budesonide-formoterol (SYMBICORT) 80-4.5 MCG/ACT inhaler, Inhale 2 puffs into the lungs 2 (two) times daily. Marland Kitchen  triamcinolone (NASACORT) 55 MCG/ACT AERO nasal inhaler, Place 2 sprays into the nose daily.  Current Outpatient Medications (Analgesics):  .  allopurinol (ZYLOPRIM) 100 MG tablet, Take 1 tablet by mouth once daily .  traMADol (ULTRAM) 50 MG tablet, Take 1 tablet (50 mg total) by mouth every 6 (six) hours as needed.  Current Outpatient Medications (Hematological):  .  cilostazol (PLETAL) 50 MG tablet, Take 1 tablet by mouth twice daily .  XARELTO 20 MG TABS tablet, TAKE 1 TABLET BY MOUTH ONCE DAILY WITH SUPPER  Current Outpatient Medications (Other):  .  b complex vitamins tablet, Take 1 tablet by mouth daily. .  Cholecalciferol (VITAMIN D3) 1000 UNITS CAPS, Take 1 Int'l Units by mouth daily.   Reviewed prior external information including notes and imaging from  primary care provider As well as notes that were available from care everywhere and other healthcare systems.  Past medical history, social, surgical and family history all reviewed in electronic medical record.  No pertanent information unless stated regarding to the chief complaint.   Review of Systems:  No headache, visual changes, nausea, vomiting, diarrhea, constipation, dizziness, abdominal pain, skin rash, fevers, chills, night sweats, weight loss, swollen lymph nodes, body aches, joint swelling, chest pain, shortness of breath, mood changes. POSITIVE muscle aches  Objective  Blood pressure 130/86, pulse 76, height 5\' 9"  (1.753 m), weight 197 lb (89.4 kg), SpO2 99 %.   General: No apparent distress alert and oriented x3 mood and affect normal, dressed appropriately.  HEENT: Pupils equal, extraocular movements intact  Respiratory: Patient's speak in full  sentences and does not appear short of breath  Cardiovascular: No lower extremity edema, non tender, no erythema  Skin: Warm dry intact with no signs of infection or rash on extremities or on axial skeleton.  Abdomen: Soft nontender  Neuro: Cranial nerves II through XII are intact, neurovascularly intact in all extremities with 2+ DTRs and 2+ pulses.  Lymph: No lymphadenopathy of posterior or anterior cervical chain or axillae bilaterally.  Gait normal with good balance and coordination.  MSK:  Non tender with full range of motion and good stability and symmetric strength and tone of elbows, wrist, hip, knee and ankles bilaterally.  Right shoulder exam does have positive Speed's, patient does have some mild positive impingement as well.  5-5 strength of the rotator cuff noted.  Significant arthritic changes of the cervical spine noted.  Patient has less than 5 degrees of extension of the neck.  Mild radiculopathy with Spurling's on the right.  Procedure: Real-time Ultrasound Guided Injection of right bicep tendon sheath Device: GE Logiq Q7 Ultrasound guided injection is preferred based studies that show increased duration, increased effect, greater accuracy, decreased procedural pain, increased response rate, and decreased cost with ultrasound guided versus blind injection.  Verbal informed consent obtained.  Time-out conducted.  Noted no overlying erythema, induration, or other signs of local infection.  Skin prepped in a sterile fashion.  Local anesthesia: Topical Ethyl chloride.  With sterile technique and under real time ultrasound guidance: With a 25-gauge half inch needle injected with 0.5 cc of 0.5% Marcaine and 0.5 cc of Kenalog 40 mg/mL Completed without difficulty  Pain immediately resolved suggesting accurate placement of the medication.  Advised to call if fevers/chills, erythema, induration, drainage, or persistent bleeding.  Images permanently stored and available for review in  the ultrasound unit.  Impression: Technically successful ultrasound guided injection.   Impression and Recommendations:     This case required medical decision making of moderate complexity. The above documentation has been reviewed and is accurate and complete Lyndal Pulley, DO       Note: This dictation was prepared with Dragon dictation along with smaller phrase technology. Any transcriptional errors that result from this process are unintentional.

## 2019-03-26 NOTE — Assessment & Plan Note (Signed)
Repeat injection given today, tolerated procedure well, I believe the patient continues to have more of a bicep tendinitis with likely some labral pathology that continues with the recurrent inflammation.  Patient wants to continue with conservative therapy though.  Discussed icing regimen and home exercises, discussed which activities to do which wants to avoid.  Patient should increase activity as tolerated.  Follow-up again in 3 months

## 2019-03-26 NOTE — Patient Instructions (Signed)
Keep hands in peripheral vision Injected shoulder today Ice 20 min 2x a day Gabapentin 100mg  at night See me in 3 months

## 2019-03-31 ENCOUNTER — Other Ambulatory Visit: Payer: Self-pay

## 2019-03-31 ENCOUNTER — Ambulatory Visit (INDEPENDENT_AMBULATORY_CARE_PROVIDER_SITE_OTHER): Payer: Medicare Other | Admitting: Internal Medicine

## 2019-03-31 ENCOUNTER — Encounter: Payer: Self-pay | Admitting: Internal Medicine

## 2019-03-31 VITALS — BP 142/78 | HR 72 | Temp 99.1°F | Ht 69.0 in | Wt 188.2 lb

## 2019-03-31 DIAGNOSIS — R7302 Impaired glucose tolerance (oral): Secondary | ICD-10-CM

## 2019-03-31 DIAGNOSIS — E611 Iron deficiency: Secondary | ICD-10-CM | POA: Diagnosis not present

## 2019-03-31 DIAGNOSIS — E538 Deficiency of other specified B group vitamins: Secondary | ICD-10-CM

## 2019-03-31 DIAGNOSIS — E559 Vitamin D deficiency, unspecified: Secondary | ICD-10-CM | POA: Diagnosis not present

## 2019-03-31 DIAGNOSIS — Z0000 Encounter for general adult medical examination without abnormal findings: Secondary | ICD-10-CM | POA: Diagnosis not present

## 2019-03-31 LAB — CBC WITH DIFFERENTIAL/PLATELET
Basophils Absolute: 0 10*3/uL (ref 0.0–0.1)
Basophils Relative: 0.5 % (ref 0.0–3.0)
Eosinophils Absolute: 0.1 10*3/uL (ref 0.0–0.7)
Eosinophils Relative: 1.5 % (ref 0.0–5.0)
HCT: 47.9 % (ref 39.0–52.0)
Hemoglobin: 15.3 g/dL (ref 13.0–17.0)
Lymphocytes Relative: 39.8 % (ref 12.0–46.0)
Lymphs Abs: 3.3 10*3/uL (ref 0.7–4.0)
MCHC: 32 g/dL (ref 30.0–36.0)
MCV: 90.2 fl (ref 78.0–100.0)
Monocytes Absolute: 0.7 10*3/uL (ref 0.1–1.0)
Monocytes Relative: 8.6 % (ref 3.0–12.0)
Neutro Abs: 4.1 10*3/uL (ref 1.4–7.7)
Neutrophils Relative %: 49.6 % (ref 43.0–77.0)
Platelets: 256 10*3/uL (ref 150.0–400.0)
RBC: 5.31 Mil/uL (ref 4.22–5.81)
RDW: 13.3 % (ref 11.5–15.5)
WBC: 8.3 10*3/uL (ref 4.0–10.5)

## 2019-03-31 LAB — HEPATIC FUNCTION PANEL
ALT: 17 U/L (ref 0–53)
AST: 17 U/L (ref 0–37)
Albumin: 4.3 g/dL (ref 3.5–5.2)
Alkaline Phosphatase: 67 U/L (ref 39–117)
Bilirubin, Direct: 0.2 mg/dL (ref 0.0–0.3)
Total Bilirubin: 0.7 mg/dL (ref 0.2–1.2)
Total Protein: 7.1 g/dL (ref 6.0–8.3)

## 2019-03-31 LAB — LIPID PANEL
Cholesterol: 109 mg/dL (ref 0–200)
HDL: 51.7 mg/dL (ref >39.00)
LDL Cholesterol: 44 mg/dL (ref 0–99)
NonHDL: 57.14
Total CHOL/HDL Ratio: 2
Triglycerides: 68 mg/dL (ref 0.0–149.0)
VLDL: 13.6 mg/dL (ref 0.0–40.0)

## 2019-03-31 LAB — BASIC METABOLIC PANEL
BUN: 16 mg/dL (ref 6–23)
CO2: 30 mEq/L (ref 19–32)
Calcium: 10.1 mg/dL (ref 8.4–10.5)
Chloride: 104 mEq/L (ref 96–112)
Creatinine, Ser: 1.11 mg/dL (ref 0.40–1.50)
GFR: 77.25 mL/min (ref >60.00)
Glucose, Bld: 70 mg/dL (ref 70–99)
Potassium: 4.2 mEq/L (ref 3.5–5.1)
Sodium: 139 mEq/L (ref 135–145)

## 2019-03-31 LAB — IBC PANEL
Iron: 141 ug/dL (ref 42–165)
Saturation Ratios: 44.8 % (ref 20.0–50.0)
Transferrin: 225 mg/dL (ref 212.0–360.0)

## 2019-03-31 LAB — TSH: TSH: 0.96 u[IU]/mL (ref 0.35–4.50)

## 2019-03-31 LAB — VITAMIN D 25 HYDROXY (VIT D DEFICIENCY, FRACTURES): VITD: 38.27 ng/mL (ref 30.00–100.00)

## 2019-03-31 LAB — HEMOGLOBIN A1C: Hgb A1c MFr Bld: 5.2 % (ref 4.6–6.5)

## 2019-03-31 LAB — PSA: PSA: 0 ng/mL — ABNORMAL LOW (ref 0.10–4.00)

## 2019-03-31 LAB — VITAMIN B12: Vitamin B-12: 706 pg/mL (ref 211–911)

## 2019-03-31 NOTE — Assessment & Plan Note (Signed)

## 2019-03-31 NOTE — Assessment & Plan Note (Signed)
stable overall by history and exam, recent data reviewed with pt, and pt to continue medical treatment as before,  to f/u any worsening symptoms or concerns  

## 2019-03-31 NOTE — Progress Notes (Signed)
Subjective:    Patient ID: Kevin Hale, male    DOB: 05-10-1940, 79 y.o.   MRN: [id number]  HPI  Here for wellness and f/u;  Overall doing ok;  Pt denies Chest pain, worsening SOB, DOE, wheezing, orthopnea, PND, worsening LE edema, palpitations, dizziness or syncope.  Pt denies neurological change such as new headache, facial or extremity weakness.  Pt denies polydipsia, polyuria, or low sugar symptoms. Pt states overall good compliance with treatment and medications, good tolerability, and has been trying to follow appropriate diet.  Pt denies worsening depressive symptoms, suicidal ideation or panic. No fever, night sweats, wt loss, loss of appetite, or other constitutional symptoms.  Pt states good ability with ADL's, has low fall risk, home safety reviewed and adequate, no other significant changes in hearing or vision, and only occasionally active with exercise. BP < 140;90 at home BP Readings from Last 3 Encounters:  03/31/19 (!) 142/78  03/26/19 130/86  12/05/18 132/68   Past Medical History:  Diagnosis Date  . Anxiety   . Atherosclerosis of abdominal aorta ([place]) 11/12/2011  . BPH (benign prostatic hyperplasia)   . Bronchiectasis ([place]) 03/26/2018  . Cancer of prostate ([place])   . Chronic anticoagulation 09/12/2016  . Coronary artery calcification seen on CT scan 03/26/2018  . Diverticulosis of colon   . DJD (degenerative joint disease)    left foot, "pt denies"  . Glucose intolerance (impaired glucose tolerance) 11/03/2010  . History of colon polyps   . History of DVT (deep vein thrombosis)   . HTN (hypertension)   . Hypercholesteremia   . Lumbar degenerative disc disease 11/12/2011  . Plantar fasciitis    Past Surgical History:  Procedure Laterality Date  . INSERTION PROSTATE RADIATION SEED    . UVULECTOMY      reports that he quit smoking about 16 months ago. His smoking use included cigarettes. He started smoking about 50 years ago. He has a 10.00 pack-year smoking history. He  has never used smokeless tobacco. He reports that he does not drink alcohol or use drugs. family history includes Deep vein thrombosis in his mother; Diabetes in his mother; Healthy in his brother and brother. Allergies  Allergen Reactions  . Pravastatin Other (See Comments)    myalgia   Current Outpatient Medications on File Prior to Visit  Medication Sig Dispense Refill  . allopurinol (ZYLOPRIM) 100 MG tablet Take 1 tablet by mouth once daily 90 tablet 1  . b complex vitamins tablet Take 1 tablet by mouth daily.    . Cholecalciferol (VITAMIN D3) 1000 UNITS CAPS Take 1 Int'l Units by mouth daily.    . cilostazol (PLETAL) 50 MG tablet Take 1 tablet by mouth twice daily 180 tablet 0  . gabapentin (NEURONTIN) 100 MG capsule Take 1 capsule (100 mg total) by mouth at bedtime. 180 capsule 0  . losartan (COZAAR) 25 MG tablet Take 1 tablet by mouth twice daily 180 tablet 2  . simvastatin (ZOCOR) 20 MG tablet TAKE 1 TABLET BY MOUTH AT BEDTIME 90 tablet 0  . triamcinolone (NASACORT) 55 MCG/ACT AERO nasal inhaler Place 2 sprays into the nose daily. 1 Inhaler 12  . XARELTO 20 MG TABS tablet TAKE 1 TABLET BY MOUTH ONCE DAILY WITH SUPPER 90 tablet 1  . budesonide-formoterol (SYMBICORT) 80-4.5 MCG/ACT inhaler Inhale 2 puffs into the lungs 2 (two) times daily. (Patient not taking: Reported on 03/31/2019) 1 Inhaler 11  . traMADol (ULTRAM) 50 MG tablet Take 1 tablet (50 mg total)  by mouth every 6 (six) hours as needed. (Patient not taking: Reported on 03/31/2019) 30 tablet 0   No current facility-administered medications on file prior to visit.   Review of Systems All otherwise neg per pt     Objective:   Physical Exam BP (!) 142/78   Pulse 72   Temp 99.1 F (37.3 C) (Oral)   Ht 5\' 9"  (1.753 m)   Wt 188 lb 3.2 oz (85.4 kg)   SpO2 99%   BMI 27.79 kg/m  VS noted,  Constitutional: Pt appears in NAD HENT: Head: NCAT.  Right Ear: External ear normal.  Left Ear: External ear normal.  Eyes: . Pupils  are equal, round, and reactive to light. Conjunctivae and EOM are normal Nose: without d/c or deformity Neck: Neck supple. Gross normal ROM Cardiovascular: Normal rate and regular rhythm.   Pulmonary/Chest: Effort normal and breath sounds without rales or wheezing.  Abd:  Soft, NT, ND, + BS, no organomegaly Neurological: Pt is alert. At baseline orientation, motor grossly intact Skin: Skin is warm. No rashes, other new lesions, no LE edema Psychiatric: Pt behavior is normal without agitation  All otherwise neg per pt Lab Results  Component Value Date   WBC 8.3 03/31/2019   HGB 15.3 03/31/2019   HCT 47.9 03/31/2019   PLT 256.0 03/31/2019   GLUCOSE 70 03/31/2019   CHOL 109 03/31/2019   TRIG 68.0 03/31/2019   HDL 51.70 03/31/2019   LDLCALC 44 03/31/2019   ALT 17 03/31/2019   AST 17 03/31/2019   NA 139 03/31/2019   K 4.2 03/31/2019   CL 104 03/31/2019   CREATININE 1.11 03/31/2019   BUN 16 03/31/2019   CO2 30 03/31/2019   TSH 0.96 03/31/2019   PSA 0.00 (L) 03/31/2019   INR 2.1 05/28/2017   HGBA1C 5.2 03/31/2019      Assessment & Plan:

## 2019-03-31 NOTE — Patient Instructions (Signed)
You are now on the [facility] Vaccine Wait List  Please continue all other medications as before, and refills have been done if requested.  Please have the pharmacy call with any other refills you may need.  Please continue your efforts at being more active, low cholesterol diet, and weight control.  You are otherwise up to date with prevention measures today.  Please keep your appointments with your specialists as you may have planned  Please go to the LAB at the blood drawing area for the tests to be done  You will be contacted by phone if any changes need to be made immediately.  Otherwise, you will receive a letter about your results with an explanation, but please check with MyChart first.  Please remember to sign up for MyChart if you have not done so, as this will be important to you in the future with finding out test results, communicating by private email, and scheduling acute appointments online when needed.  Please make an Appointment to return in 6 months, or sooner if needed

## 2019-04-01 LAB — URINALYSIS, ROUTINE W REFLEX MICROSCOPIC
Bilirubin Urine: NEGATIVE
Hgb urine dipstick: NEGATIVE
Ketones, ur: NEGATIVE
Leukocytes,Ua: NEGATIVE
Nitrite: NEGATIVE
RBC / HPF: NONE SEEN (ref 0–?)
Specific Gravity, Urine: 1.01 (ref 1.000–1.030)
Total Protein, Urine: NEGATIVE
Urine Glucose: NEGATIVE
Urobilinogen, UA: 0.2 (ref 0.0–1.0)
WBC, UA: NONE SEEN (ref 0–?)
pH: 7 (ref 5.0–8.0)

## 2019-05-14 ENCOUNTER — Other Ambulatory Visit: Payer: Self-pay | Admitting: Internal Medicine

## 2019-05-14 NOTE — Telephone Encounter (Signed)
Xarelto 20mg  refill request received. Pt is 79 years old, weight-85.4kg, Crea-1.11 on 03/31/2019, last seen by Dr. Harrington Challenger on 12/05/2018 and due to follow up this October 2021, Diagnosis-DVT/PE, CrCl-65.64ml/min; Dose is appropriate based on dosing criteria. Will send in refill to requested pharmacy.

## 2019-06-24 ENCOUNTER — Ambulatory Visit: Payer: Medicare Other | Admitting: Family Medicine

## 2019-09-17 ENCOUNTER — Other Ambulatory Visit: Payer: Self-pay | Admitting: Internal Medicine

## 2019-09-17 NOTE — Telephone Encounter (Signed)
Please refill as per office routine med refill policy (all routine meds refilled for 3 mo or monthly per pt preference up to one year from last visit, then month to month grace period for 3 mo, then further med refills will have to be denied)  

## 2019-09-29 ENCOUNTER — Ambulatory Visit: Payer: Medicare Other | Admitting: Family Medicine

## 2019-09-30 ENCOUNTER — Other Ambulatory Visit: Payer: Self-pay

## 2019-09-30 ENCOUNTER — Ambulatory Visit (INDEPENDENT_AMBULATORY_CARE_PROVIDER_SITE_OTHER): Payer: Medicare Other | Admitting: Internal Medicine

## 2019-09-30 ENCOUNTER — Ambulatory Visit: Payer: Medicare Other | Admitting: Family Medicine

## 2019-09-30 ENCOUNTER — Ambulatory Visit: Payer: Self-pay

## 2019-09-30 ENCOUNTER — Ambulatory Visit (INDEPENDENT_AMBULATORY_CARE_PROVIDER_SITE_OTHER): Payer: Medicare Other

## 2019-09-30 ENCOUNTER — Encounter: Payer: Self-pay | Admitting: Family Medicine

## 2019-09-30 ENCOUNTER — Encounter: Payer: Self-pay | Admitting: Internal Medicine

## 2019-09-30 VITALS — BP 116/72 | HR 76 | Ht 69.0 in | Wt 193.0 lb

## 2019-09-30 VITALS — BP 120/78 | HR 72 | Temp 98.7°F | Ht 69.0 in | Wt 192.0 lb

## 2019-09-30 DIAGNOSIS — E7800 Pure hypercholesterolemia, unspecified: Secondary | ICD-10-CM

## 2019-09-30 DIAGNOSIS — R7302 Impaired glucose tolerance (oral): Secondary | ICD-10-CM

## 2019-09-30 DIAGNOSIS — L509 Urticaria, unspecified: Secondary | ICD-10-CM | POA: Insufficient documentation

## 2019-09-30 DIAGNOSIS — G8929 Other chronic pain: Secondary | ICD-10-CM

## 2019-09-30 DIAGNOSIS — I10 Essential (primary) hypertension: Secondary | ICD-10-CM

## 2019-09-30 DIAGNOSIS — M25511 Pain in right shoulder: Secondary | ICD-10-CM

## 2019-09-30 DIAGNOSIS — M7521 Bicipital tendinitis, right shoulder: Secondary | ICD-10-CM

## 2019-09-30 MED ORDER — PREDNISONE 10 MG PO TABS: ORAL_TABLET | ORAL | 0 refills | Status: DC

## 2019-09-30 NOTE — Progress Notes (Signed)
[facility] [place] Phone: [phone] Subjective:   Kevin Hale, am serving as a scribe for Dr. Hulan Saas. This visit occurred during the SARS-CoV-2 public health emergency.  Safety protocols were in place, including screening questions prior to the visit, additional usage of staff PPE, and extensive cleaning of exam room while observing appropriate contact time as indicated for disinfecting solutions.   I'm seeing this patient by the request  of:  Biagio Borg, MD  CC:   [id number]   03/26/2019 Repeat injection given today, tolerated procedure well, I believe the patient continues to have more of a bicep tendinitis with likely some labral pathology that continues with the recurrent inflammation.  Patient wants to continue with conservative therapy though.  Discussed icing regimen and home exercises, discussed which activities to do which wants to avoid.  Patient should increase activity as tolerated.  Follow-up again in 3 months  Update 09/30/2019 Kevin Hale is a 79 y.o. male coming in with complaint of right shoulder pain. Pain is getting worse in anterior aspect of shoulder especially with extension and overhead movements. Hale new injury.      Past Medical History:  Diagnosis Date  . Anxiety   . Atherosclerosis of abdominal aorta ([place]) 11/12/2011  . BPH (benign prostatic hyperplasia)   . Bronchiectasis ([place]) 03/26/2018  . Cancer of prostate ([place])   . Chronic anticoagulation 09/12/2016  . Coronary artery calcification seen on CT scan 03/26/2018  . Diverticulosis of colon   . DJD (degenerative joint disease)    left foot, "pt denies"  . Glucose intolerance (impaired glucose tolerance) 11/03/2010  . History of colon polyps   . History of DVT (deep vein thrombosis)   . HTN (hypertension)   . Hypercholesteremia   . Lumbar degenerative disc disease 11/12/2011  . Plantar fasciitis    Past Surgical History:   Procedure Laterality Date  . INSERTION PROSTATE RADIATION SEED    . UVULECTOMY     Social History   Socioeconomic History  . Marital status: Married    Spouse name: Not on file  . Number of children: 2  . Years of education: Not on file  . Highest education level: Not on file  Occupational History  . Occupation: retired  Tobacco Use  . Smoking status: Former Smoker    Packs/day: 0.25    Years: 40.00    Pack years: 10.00    Types: Cigarettes    Start date: 02/26/1969    Quit date: 11/23/2017    Years since quitting: 1.8  . Smokeless tobacco: Never Used  Vaping Use  . Vaping Use: Never used  Substance and Sexual Activity  . Alcohol use: Hale  . Drug use: Hale  . Sexual activity: Not Currently  Other Topics Concern  . Not on file  Social History Narrative  . Not on file   Social Determinants of Health   Financial Resource Strain:   . Difficulty of Paying Living Expenses:   Food Insecurity:   . Worried About Charity fundraiser in the Last Year:   . Arboriculturist in the Last Year:   Transportation Needs:   . Film/video editor (Medical):   Marland Kitchen Lack of Transportation (Non-Medical):   Physical Activity:   . Days of Exercise per Week:   . Minutes of Exercise per Session:   Stress:   . Feeling of Stress :   Social Connections:   .  Frequency of Communication with Friends and Family:   . Frequency of Social Gatherings with Friends and Family:   . Attends Religious Services:   . Active Member of Clubs or Organizations:   . Attends Archivist Meetings:   Marland Kitchen Marital Status:    Allergies  Allergen Reactions  . Pravastatin Other (See Comments)    myalgia   Family History  Problem Relation Age of Onset  . Deep vein thrombosis Mother   . Diabetes Mother   . Healthy Brother   . Healthy Brother   . Colon cancer Neg Hx   . Rectal cancer Neg Hx      Current Outpatient Medications (Cardiovascular):  .  losartan (COZAAR) 25 MG tablet, Take 1 tablet by mouth  twice daily .  simvastatin (ZOCOR) 20 MG tablet, TAKE 1 TABLET BY MOUTH AT BEDTIME  Current Outpatient Medications (Respiratory):  .  budesonide-formoterol (SYMBICORT) 80-4.5 MCG/ACT inhaler, Inhale 2 puffs into the lungs 2 (two) times daily. Marland Kitchen  triamcinolone (NASACORT) 55 MCG/ACT AERO nasal inhaler, Place 2 sprays into the nose daily.  Current Outpatient Medications (Analgesics):  .  allopurinol (ZYLOPRIM) 100 MG tablet, Take 1 tablet by mouth once daily .  traMADol (ULTRAM) 50 MG tablet, Take 1 tablet (50 mg total) by mouth every 6 (six) hours as needed.  Current Outpatient Medications (Hematological):  .  cilostazol (PLETAL) 50 MG tablet, Take 1 tablet by mouth twice daily .  XARELTO 20 MG TABS tablet, TAKE 1 TABLET BY MOUTH ONCE DAILY WITH SUPPER  Current Outpatient Medications (Other):  .  b complex vitamins tablet, Take 1 tablet by mouth daily. .  Cholecalciferol (VITAMIN D3) 1000 UNITS CAPS, Take 1 Int'l Units by mouth daily. Marland Kitchen  gabapentin (NEURONTIN) 100 MG capsule, Take 1 capsule (100 mg total) by mouth at bedtime.   Reviewed prior external information including notes and imaging from  primary care provider As well as notes that were available from care everywhere and other healthcare systems.  Past medical history, social, surgical and family history all reviewed in electronic medical record.  Hale pertanent information unless stated regarding to the chief complaint.   Review of Systems:  Hale headache, visual changes, nausea, vomiting, diarrhea, constipation, dizziness, abdominal pain, skin rash, fevers, chills, night sweats, weight loss, swollen lymph nodes, body aches, joint swelling, chest pain, shortness of breath, mood changes. POSITIVE muscle aches  Objective  Blood pressure 116/72, pulse 76, height 5\' 9"  (1.753 m), weight 193 lb (87.5 kg), SpO2 99 %.   General: Hale apparent distress mild masked facies HEENT: Pupils equal, extraocular movements intact  Respiratory:  Patient's speak in full sentences and does not appear short of breath  Cardiovascular: Hale lower extremity edema, non tender, Hale erythema  Neuro: Cranial nerves II through XII are intact, neurovascularly intact in all extremities with 2+ DTRs and 2+ pulses.  Gait patient does have a wide-based gait Right shoulder still has impingement.  Positive Speed and Yergason's.  Hale significant weakness of the rotator cuff Hale noted.  Can tell though that testing strength is painful.   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  Patient also has what appears to be a new subscapularis tear noted with partial retraction.  Difficult to tell if full-thickness but does not appear to be.  Patient's supraspinatus has what appears to be afairly large bursitis and difficult to tell if there is any tearing if so only the superficial layer.  Glenohumeral joint mild to moderate arthritic changes  Procedure: Real-time Ultrasound Guided Injection of right glenohumeral joint Device: GE Logiq Q7  Ultrasound guided injection is preferred based studies that show increased duration, increased effect, greater accuracy, decreased procedural pain, increased response rate with ultrasound guided versus blind injection.  Verbal informed consent obtained.  Time-out conducted.  Noted Hale overlying erythema, induration, or other signs of local infection.  Skin prepped in a sterile fashion.  Local anesthesia: Topical Ethyl chloride.  With sterile technique and under real time ultrasound guidance:  Joint visualized.  23g 1  inch needle inserted anterior approach. Pictures taken for needle placement. Patient did have injection of , 2 cc of 0.5% Marcaine, and 1.0 cc of Kenalog 40 mg/dL. Completed without difficulty  Pain  immediately resolved suggesting accurate placement of the medication.  Advised to call if fevers/chills, erythema, induration, drainage, or persistent bleeding.  Images permanently stored and available for review in the ultrasound unit.  Impression: Technically successful ultrasound guided injection.   Impression and Recommendations:     The above documentation has been reviewed and is accurate and complete Lyndal Pulley, DO       Note: This dictation was prepared with Dragon dictation along with smaller phrase technology. Any transcriptional errors that result from this process are unintentional.

## 2019-09-30 NOTE — Patient Instructions (Signed)
Please take all new medication as prescribed- the prednisone  Please continue all other medications as before, and refills have been done if requested.  Please have the pharmacy call with any other refills you may need.  Please continue your efforts at being more active, low cholesterol diet, and weight control  Please keep your appointments with your specialists as you may have planned  Please go to the LAB at the blood drawing area for the tests to be done  You will be contacted by phone if any changes need to be made immediately.  Otherwise, you will receive a letter about your results with an explanation, but please check with MyChart first.  Please remember to sign up for MyChart if you have not done so, as this will be important to you in the future with finding out test results, communicating by private email, and scheduling acute appointments online when needed.  Please make an Appointment to return in 6 months, or sooner if needed 

## 2019-09-30 NOTE — Assessment & Plan Note (Addendum)
For benadryl prn,, allegra daily, predpac asd,  to f/u any worsening symptoms or concerns  I spent 31 minutes in preparing to see the patient by review of recent labs, imaging and procedures, obtaining and reviewing separately obtained history, communicating with the patient and family or caregiver, ordering medications, tests or procedures, and documenting clinical information in the EHR including the differential Dx, treatment, and any further evaluation and other management of hives, hyperglycemia, htn, hld

## 2019-09-30 NOTE — Assessment & Plan Note (Signed)
stable overall by history and exam, recent data reviewed with pt, and pt to continue medical treatment as before,  to f/u any worsening symptoms or concerns  

## 2019-09-30 NOTE — Progress Notes (Signed)
Subjective:    Patient ID: Kevin Hale, male    DOB: Nov 10, 1940, 79 y.o.   MRN: [id number]  HPI  Here with 5 day onset recurrent hive like rash with itchy red areas swelling to various places all over without tongue swelliing, wheezing or sob. Pt denies chest pain, increased sob or doe, wheezing, orthopnea, PND, increased LE swelling, palpitations, dizziness or syncope.  Pt denies new neurological symptoms such as new headache, or facial or extremity weakness or numbness   Pt denies polydipsia, polyuria Past Medical History:  Diagnosis Date  . Anxiety   . Atherosclerosis of abdominal aorta ([place]) 11/12/2011  . BPH (benign prostatic hyperplasia)   . Bronchiectasis ([place]) 03/26/2018  . Cancer of prostate ([place])   . Chronic anticoagulation 09/12/2016  . Coronary artery calcification seen on CT scan 03/26/2018  . Diverticulosis of colon   . DJD (degenerative joint disease)    left foot, "pt denies"  . Glucose intolerance (impaired glucose tolerance) 11/03/2010  . History of colon polyps   . History of DVT (deep vein thrombosis)   . HTN (hypertension)   . Hypercholesteremia   . Lumbar degenerative disc disease 11/12/2011  . Plantar fasciitis    Past Surgical History:  Procedure Laterality Date  . INSERTION PROSTATE RADIATION SEED    . UVULECTOMY      reports that he quit smoking about 22 months ago. His smoking use included cigarettes. He started smoking about 50 years ago. He has a 10.00 pack-year smoking history. He has never used smokeless tobacco. He reports that he does not drink alcohol and does not use drugs. family history includes Deep vein thrombosis in his mother; Diabetes in his mother; Healthy in his brother and brother. Allergies  Allergen Reactions  . Pravastatin Other (See Comments)    myalgia   Current Outpatient Medications on File Prior to Visit  Medication Sig Dispense Refill  . allopurinol (ZYLOPRIM) 100 MG tablet Take 1 tablet by mouth once daily 90 tablet 0  . b  complex vitamins tablet Take 1 tablet by mouth daily.    . B Complex-Biotin-FA (SUPER QUINTS B-50) TABS Take by mouth.    . Cholecalciferol (VITAMIN D3) 1000 UNITS CAPS Take 1 Int'l Units by mouth daily.    Marland Kitchen losartan (COZAAR) 25 MG tablet Take 1 tablet by mouth twice daily 180 tablet 2  . simvastatin (ZOCOR) 20 MG tablet TAKE 1 TABLET BY MOUTH AT BEDTIME 90 tablet 3  . XARELTO 20 MG TABS tablet TAKE 1 TABLET BY MOUTH ONCE DAILY WITH SUPPER 90 tablet 1   No current facility-administered medications on file prior to visit.   Review of Systems All otherwise neg per pt     Objective:   Physical Exam BP 120/78 (BP Location: Left Arm, Patient Position: Sitting, Cuff Size: Large)   Pulse 72   Temp 98.7 F (37.1 C) (Oral)   Ht 5\' 9"  (1.753 m)   Wt 192 lb (87.1 kg)   SpO2 97%   BMI 28.35 kg/m  VS noted,  Constitutional: Pt appears in NAD HENT: Head: NCAT.  Right Ear: External ear normal.  Left Ear: External ear normal.  Eyes: . Pupils are equal, round, and reactive to light. Conjunctivae and EOM are normal Nose: without d/c or deformity Neck: Neck supple. Gross normal ROM Cardiovascular: Normal rate and regular rhythm.   Pulmonary/Chest: Effort normal and breath sounds without rales or wheezing.  Abd:  Soft, NT, ND, + BS, no organomegaly Neurological: Pt  is alert. At baseline orientation, motor grossly intact Skin: Skin is warm. + hive rash wheel and flares to torso, other new lesions, no LE edema Psychiatric: Pt behavior is normal without agitation  All otherwise neg per pt Lab Results  Component Value Date   WBC 8.3 03/31/2019   HGB 15.3 03/31/2019   HCT 47.9 03/31/2019   PLT 256.0 03/31/2019   GLUCOSE 70 03/31/2019   CHOL 109 03/31/2019   TRIG 68.0 03/31/2019   HDL 51.70 03/31/2019   LDLCALC 44 03/31/2019   ALT 17 03/31/2019   AST 17 03/31/2019   NA 139 03/31/2019   K 4.2 03/31/2019   CL 104 03/31/2019   CREATININE 1.11 03/31/2019   BUN 16 03/31/2019   CO2 30  03/31/2019   TSH 0.96 03/31/2019   PSA 0.00 (L) 03/31/2019   INR 2.1 05/28/2017   HGBA1C 5.2 03/31/2019          Assessment & Plan:

## 2019-09-30 NOTE — Patient Instructions (Signed)
PT will call you Injected shoulder today See me again in 4-6 weeks

## 2019-09-30 NOTE — Assessment & Plan Note (Signed)
Discussed with patient in great length.  Patient is having worsening tearing noted as well as now subscapularis tendon noted.  I do believe that there is an anterior labral pathology.  I encouraged patient to consider the possibility of an MRI but at this point patient wanted to consider more of the injection and physical therapy.  Patient feels that if he did get this under control he would do much better and has had more of the response to the injections previously.  I encouraged him know that worsening symptoms he is to call us and we will get an MRI sooner.  Follow-up again in 4 to 6 weeks encourage him to avoid any lifting greater than 5 pounds for the next 10 days secondary to the injection

## 2019-10-01 LAB — COMPLETE METABOLIC PANEL WITH GFR
AG Ratio: 1.4 (calc) (ref 1.0–2.5)
ALT: 16 U/L (ref 9–46)
AST: 19 U/L (ref 10–35)
Albumin: 4.2 g/dL (ref 3.6–5.1)
Alkaline phosphatase (APISO): 71 U/L (ref 35–144)
BUN: 9 mg/dL (ref 7–25)
CO2: 27 mmol/L (ref 20–32)
Calcium: 10 mg/dL (ref 8.6–10.3)
Chloride: 103 mmol/L (ref 98–110)
Creat: 1.08 mg/dL (ref 0.70–1.18)
GFR, Est African American: 75 mL/min/{1.73_m2} (ref >=60)
GFR, Est Non African American: 65 mL/min/{1.73_m2} (ref >=60)
Globulin: 3.1 g/dL (calc) (ref 1.9–3.7)
Glucose, Bld: 58 mg/dL — ABNORMAL LOW (ref 65–99)
Potassium: 4.5 mmol/L (ref 3.5–5.3)
Sodium: 141 mmol/L (ref 135–146)
Total Bilirubin: 0.6 mg/dL (ref 0.2–1.2)
Total Protein: 7.3 g/dL (ref 6.1–8.1)

## 2019-10-02 ENCOUNTER — Encounter: Payer: Self-pay | Admitting: Internal Medicine

## 2019-10-02 LAB — CBC WITH DIFFERENTIAL/PLATELET
Absolute Monocytes: 521 cells/uL (ref 200–950)
Basophils Absolute: 40 cells/uL (ref 0–200)
Basophils Relative: 0.6 %
Eosinophils Absolute: 158 cells/uL (ref 15–500)
Eosinophils Relative: 2.4 %
HCT: 52.2 % — ABNORMAL HIGH (ref 38.5–50.0)
Hemoglobin: 16.4 g/dL (ref 13.2–17.1)
Lymphs Abs: 3062 cells/uL (ref 850–3900)
MCH: 28.8 pg (ref 27.0–33.0)
MCHC: 31.4 g/dL — ABNORMAL LOW (ref 32.0–36.0)
MCV: 91.6 fL (ref 80.0–100.0)
MPV: 9.6 fL (ref 7.5–12.5)
Monocytes Relative: 7.9 %
Neutro Abs: 2818 cells/uL (ref 1500–7800)
Neutrophils Relative %: 42.7 %
Platelets: 250 10*3/uL (ref 140–400)
RBC: 5.7 10*6/uL (ref 4.20–5.80)
RDW: 11.6 % (ref 11.0–15.0)
Total Lymphocyte: 46.4 %
WBC: 6.6 10*3/uL (ref 3.8–10.8)

## 2019-10-02 LAB — LIPID PANEL
Cholesterol: 117 mg/dL (ref <200)
HDL: 56 mg/dL (ref >=40)
LDL Cholesterol (Calc): 45 mg/dL (calc)
Non-HDL Cholesterol (Calc): 61 mg/dL (calc) (ref <130)
Total CHOL/HDL Ratio: 2.1 (calc) (ref <5.0)
Triglycerides: 80 mg/dL (ref <150)

## 2019-10-02 LAB — HEMOGLOBIN A1C
Hgb A1c MFr Bld: 5.2 % of total Hgb (ref <5.7)
Mean Plasma Glucose: 103 (calc)
eAG (mmol/L): 5.7 (calc)

## 2019-10-02 LAB — URINALYSIS, ROUTINE W REFLEX MICROSCOPIC

## 2019-11-04 ENCOUNTER — Ambulatory Visit: Payer: Medicare Other | Admitting: Family Medicine

## 2019-11-09 ENCOUNTER — Other Ambulatory Visit: Payer: Self-pay | Admitting: Internal Medicine

## 2019-11-18 ENCOUNTER — Ambulatory Visit: Payer: Medicare Other | Admitting: Family Medicine

## 2019-12-09 NOTE — Progress Notes (Signed)
Cardiology Office Note   Date:  12/10/2019   ID:  Kevin Hale, DOB October 21, 1940, MRN [id number]  PCP:  Biagio Borg, MD  Cardiologist:   Dorris Carnes, MD   Pt presents for f/u of DVT?PE    History of Present Illness: Kevin Hale is a 79 y.o. 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 allopurinol (ZYLOPRIM) 100 MG tablet Take 1 tablet by mouth once daily 90 tablet 0  . b complex vitamins tablet Take 1 tablet by mouth daily.    . B Complex-Biotin-FA (SUPER QUINTS B-50) TABS Take by mouth.    . Cholecalciferol (VITAMIN D3) 1000 UNITS CAPS Take 1 Int'l Units by mouth daily.    Marland Kitchen losartan (COZAAR) 25 MG tablet Take 1 tablet by mouth twice daily 180 tablet 0  . simvastatin (ZOCOR) 20 MG tablet TAKE 1 TABLET BY MOUTH AT BEDTIME 90 tablet 3  . XARELTO 20 MG TABS tablet TAKE 1 TABLET BY MOUTH ONCE DAILY WITH SUPPER 90 tablet 0  . predniSONE (DELTASONE) 10 MG tablet 3 tabs by mouth per day for 3 days,2tabs per day for 3 days,1tab per day for 3 days (Patient not taking: Reported on 12/10/2019) 18 tablet 0   No facility-administered medications prior to visit.     Allergies:   Pravastatin   Past Medical History:  Diagnosis Date  . Anxiety   . Atherosclerosis of abdominal aorta ([place]) 11/12/2011  . BPH (benign prostatic hyperplasia)   . Bronchiectasis ([place]) 03/26/2018  . Cancer of prostate ([place])   . Chronic anticoagulation 09/12/2016  . Coronary artery calcification seen on CT scan 03/26/2018  . Diverticulosis of colon   . DJD (degenerative joint  disease)    left foot, "pt denies"  . Glucose intolerance (impaired glucose tolerance) 11/03/2010  . History of colon polyps   . History of DVT (deep vein thrombosis)   . HTN (hypertension)   . Hypercholesteremia   . Lumbar degenerative disc disease 11/12/2011  . Plantar fasciitis     Past Surgical History:  Procedure Laterality Date  . INSERTION PROSTATE RADIATION SEED    . UVULECTOMY       Social History:  The patient  reports that he quit smoking about 2 years ago. His smoking use included cigarettes. He started smoking about 50 years ago. He has a 10.00 pack-year smoking history. He has never used smokeless tobacco. He reports that he does not drink alcohol and does not use drugs.   Family History:  The patient's family history includes Deep vein thrombosis in his mother; Diabetes in his mother; Healthy in his brother and brother.    ROS:  Please see the history of present illness. All other systems are reviewed and  Negative to the above problem except as noted.    PHYSICAL EXAM: VS:  BP 134/74   Pulse 83  Ht 5\' 9"  (1.753 m)   Wt 192 lb 3.2 oz (87.2 kg)   SpO2 96%   BMI 28.38 kg/m   GEN: Overweight 79 yo  in no acute distress  HEENT: normal  Neck: no JVD, carotid bruits Cardiac: RRR; no murmurs, rubs, or gallops,  No LE  edema  Respiratory:   Relatively clear   GI: soft, nontender, nondistended, + BS  No hepatomegaly  MS: no deformity Moving all extremities   Skin: warm and dry, no rash Neuro:  Strength and sensation are intact Psych: euthymic mood, full affect   EKG:  EKG is ordered today.   SR 83 bpm  LVH   LAFB     Lipid Panel    Component Value Date/Time   CHOL 117 09/30/2019 1205   TRIG 80 09/30/2019 1205   HDL 56 09/30/2019 1205   CHOLHDL 2.1 09/30/2019 1205   VLDL 13.6 03/31/2019 1118   LDLCALC 45 09/30/2019 1205      Wt Readings from Last 3 Encounters:  12/10/19 192 lb 3.2 oz (87.2 kg)  09/30/19 192 lb (87.1 kg)  09/30/19 193 lb (87.5 kg)       ASSESSMENT AND PLAN: 1/  Hx of DVT/PE   Keep on Xarelto  Hgb is normal          3  HTN  BP is OK  Continue meds and follow  4  PVOD  Atherosclerosis of aorta  No masses  Good pulses distally    5  HL   Conitnue  statin   LDL was 45 in Aug 2021    5  ? Sleep apnea   Pt says he snores a lot  Will set up sleep evaluation    F/U in 12 months     Stay active     Current medicines are reviewed at length with the patient today.  The patient does not have concerns regarding medicines.  Signed, Dorris Carnes, MD  12/10/2019 11:01 AM    [address], [place], [place]  [address] Phone: [phone]; Fax: [phone]

## 2019-12-10 ENCOUNTER — Encounter: Payer: Self-pay | Admitting: Internal Medicine

## 2019-12-10 ENCOUNTER — Ambulatory Visit: Payer: Medicare Other | Admitting: Internal Medicine

## 2019-12-10 ENCOUNTER — Other Ambulatory Visit: Payer: Self-pay

## 2019-12-10 VITALS — BP 134/74 | HR 83 | Ht 69.0 in | Wt 192.2 lb

## 2019-12-10 DIAGNOSIS — R0683 Snoring: Secondary | ICD-10-CM | POA: Diagnosis not present

## 2019-12-10 NOTE — Patient Instructions (Signed)
Medication Instructions:  No changes *If you need a refill on your cardiac medications before your next appointment, please call your pharmacy*   Lab Work: none If you have labs (blood work) drawn today and your tests are completely normal, you will receive your results only by: . MyChart Message (if you have MyChart) OR . A paper copy in the mail If you have any lab test that is abnormal or we need to change your treatment, we will call you to review the results.   Testing/Procedures: none   Follow-Up: At [facility], you and your health needs are our priority.  As part of our continuing mission to provide you with exceptional heart care, we have created designated Provider Care Teams.  These Care Teams include your primary Cardiologist (physician) and Advanced Practice Providers (APPs -  Physician Assistants and Nurse Practitioners) who all work together to provide you with the care you need, when you need it.   Your next appointment:   12 month(s)  The format for your next appointment:   In Person  Provider:   You may see Paula Ross, MD or one of the following Advanced Practice Providers on your designated Care Team:    Scott Weaver, PA-C  Vin Bhagat, PA-C   Other Instructions   

## 2019-12-15 ENCOUNTER — Other Ambulatory Visit: Payer: Self-pay | Admitting: Internal Medicine

## 2019-12-15 NOTE — Telephone Encounter (Signed)
Please refill as per office routine med refill policy (all routine meds refilled for 3 mo or monthly per pt preference up to one year from last visit, then month to month grace period for 3 mo, then further med refills will have to be denied)  

## 2019-12-17 ENCOUNTER — Telehealth: Payer: Self-pay | Admitting: Internal Medicine

## 2019-12-17 NOTE — Telephone Encounter (Signed)
I am sorry but I am not accepting transfer patients

## 2019-12-17 NOTE — Telephone Encounter (Signed)
   Scheduler left message to make patient aware Dr Quay Burow unable to accept

## 2019-12-17 NOTE — Telephone Encounter (Signed)
    Patient requesting TOC from Dr Jenny Reichmann to Dr Quay Burow. Patient states he prefers a male provider  Please advise

## 2019-12-17 NOTE — Telephone Encounter (Signed)
Ok with me 

## 2019-12-29 DIAGNOSIS — L821 Other seborrheic keratosis: Secondary | ICD-10-CM | POA: Diagnosis not present

## 2019-12-29 DIAGNOSIS — L820 Inflamed seborrheic keratosis: Secondary | ICD-10-CM | POA: Diagnosis not present

## 2019-12-29 DIAGNOSIS — L720 Epidermal cyst: Secondary | ICD-10-CM | POA: Diagnosis not present

## 2019-12-30 ENCOUNTER — Encounter: Payer: Self-pay | Admitting: Internal Medicine

## 2019-12-30 ENCOUNTER — Ambulatory Visit: Payer: Medicare Other | Admitting: Internal Medicine

## 2019-12-30 VITALS — BP 144/70 | HR 76 | Ht 68.0 in | Wt 193.1 lb

## 2019-12-30 DIAGNOSIS — Z7901 Long term (current) use of anticoagulants: Secondary | ICD-10-CM

## 2019-12-30 DIAGNOSIS — Z86010 Personal history of colonic polyps: Secondary | ICD-10-CM

## 2019-12-30 MED ORDER — SUTAB 1479-225-188 MG PO TABS: 1.0000 | ORAL_TABLET | Freq: Once | ORAL | 0 refills | Status: AC

## 2019-12-30 NOTE — Patient Instructions (Signed)
You have been scheduled for a colonoscopy. Please follow written instructions given to you at your visit today.  Please pick up your prep supplies at the pharmacy within the next 1-3 days. If you use inhalers (even only as needed), please bring them with you on the day of your procedure.   

## 2019-12-30 NOTE — Progress Notes (Signed)
HISTORY OF PRESENT ILLNESS:  Kevin Hale is a 79 y.o. 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  He presents today regarding surveillance colonoscopy.  Patient has a history of multiple adenomatous colon polyps.  His last colonoscopy was performed November 26, 2016.  He was found to have 7 adenomatous colon polyps.  Follow-up in 3 years recommended.  At the time of his last procedure he was on Coumadin therapy.  He elected to have his procedure performed on anticoagulation therapy.  For convenience, he has since transitioned off Coumadin therapy and is now on Xarelto.  He tells me that he has been doing well.  No active GI complaints.  He has enjoyed good general health.  He is motivated to have his colonoscopy next time.  He remains active.  Review of outside blood work from September 30, 2019 shows normal CBC with hemoglobin 16.4.  He has completed his Covid vaccination series.  REVIEW OF SYSTEMS:  All non-GI ROS negative as otherwise stated in the HPI except for arthritis  Past Medical History:  Diagnosis Date   Anxiety    Atherosclerosis of abdominal aorta ([place]) 11/12/2011   BPH (benign prostatic hyperplasia)    Bronchiectasis ([place]) 03/26/2018   Cancer of prostate ([place])    Chronic anticoagulation 09/12/2016   Coronary artery calcification seen on CT scan 03/26/2018   Diverticulosis of colon    DJD (degenerative joint disease)    left foot, "pt denies"   Glucose intolerance (impaired glucose tolerance) 11/03/2010   History of colon polyps    History of DVT (deep vein thrombosis)    HTN (hypertension)    Hypercholesteremia    Lumbar degenerative disc disease 11/12/2011   Plantar fasciitis     Past Surgical History:  Procedure Laterality Date   INSERTION PROSTATE RADIATION SEED     UVULECTOMY      Social History Kevin Hale  reports that he quit smoking about 2 years ago. His smoking  use included cigarettes. He started smoking about 50 years ago. He has a 10.00 pack-year smoking history. He has never used smokeless tobacco. He reports that he does not drink alcohol and does not use drugs.  family history includes Deep vein thrombosis in his mother; Diabetes in his mother; Healthy in his brother and brother.  Allergies  Allergen Reactions   Pravastatin Other (See Comments)    myalgia       PHYSICAL EXAMINATION: Vital signs: BP (!) 144/70 (BP Location: Left Arm, Patient Position: Sitting, Cuff Size: Normal)    Pulse 76    Ht 5\' 8"  (1.727 m) Comment: height measured without shoes   Wt 193 lb 2 oz (87.6 kg)    BMI 29.36 kg/m   Constitutional: generally well-appearing, no acute distress Psychiatric: alert and oriented x3, cooperative Eyes: extraocular movements intact, anicteric, conjunctiva pink Mouth: oral pharynx moist, no lesions Neck: supple no lymphadenopathy Cardiovascular: heart regular rate and rhythm, no murmur Lungs: clear to auscultation bilaterally Abdomen: soft, nontender, nondistended, no obvious ascites, no peritoneal signs, normal bowel sounds, no organomegaly Rectal: Deferred to colonoscopy Extremities: no clubbing, cyanosis, or lower extremity edema bilaterally Skin: no lesions on visible extremities Neuro: No focal deficits.  Cranial nerves intact  ASSESSMENT:  1.  History of multiple adenomatous colon polyps.  Due for surveillance 2.  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  History of prostate cancer 4.  General  medical problems   PLAN:  1.  Surveillance colonoscopy.  We again discussed the risk benefit ratio of continuing or disrupting for bridging anticoagulation therapy.  He prefers to have the procedure performed on anticoagulation.  I am comfortable with this approach, as previous.  Patient is HIGH RISK given his comorbidities and need for anticoagulation therapy.The nature of the procedure, as well as the  risks, benefits, and alternatives were carefully and thoroughly reviewed with the patient. Ample time for discussion and questions allowed. The patient understood, was satisfied, and agreed to proceed.

## 2020-01-04 ENCOUNTER — Telehealth: Payer: Self-pay | Admitting: *Deleted

## 2020-01-04 NOTE — Telephone Encounter (Signed)
Patient called today to inquire about his home sleep test. No staff message was ever sent to me to order the test. Today the test will be sent to the sleep pool.  Per Dr Harrington Challenger home sleep test has been ordered.  HST sent to sleep pool

## 2020-01-18 ENCOUNTER — Ambulatory Visit (INDEPENDENT_AMBULATORY_CARE_PROVIDER_SITE_OTHER): Payer: Medicare Other

## 2020-01-18 ENCOUNTER — Ambulatory Visit (INDEPENDENT_AMBULATORY_CARE_PROVIDER_SITE_OTHER): Payer: Medicare Other | Admitting: Internal Medicine

## 2020-01-18 ENCOUNTER — Encounter: Payer: Self-pay | Admitting: Internal Medicine

## 2020-01-18 ENCOUNTER — Other Ambulatory Visit: Payer: Self-pay

## 2020-01-18 VITALS — BP 142/80 | HR 76 | Temp 98.3°F | Ht 68.0 in | Wt 194.4 lb

## 2020-01-18 DIAGNOSIS — M545 Low back pain, unspecified: Secondary | ICD-10-CM | POA: Insufficient documentation

## 2020-01-18 NOTE — Patient Instructions (Signed)
  An x-ray of your lower back was ordered - have that done downstairs.     Medications changes include :   Try Tylenol for your pain.

## 2020-01-18 NOTE — Assessment & Plan Note (Signed)
Acute Started 3 weeks ago - no recent trauma, but did fall on his back 2 months ago No radiculopathy and this pain is different  Xray today Will likely need Ct scan to better visualize area - concern about possible psoas hematoma vs lumbar spine issue - fracture -- given recent fall Start tylenol for pain - if pain is not controlled he will let me know  Can try heat/ice

## 2020-01-18 NOTE — Progress Notes (Signed)
Subjective:    Patient ID: Kevin Hale, male    DOB: 24-Nov-1940, 79 y.o.   MRN: [id number]  HPI The patient is here for an acute visit.   Left lower back pain - it started 3 weeks ago and it is getting worse.  He can hardly get out of the bed in the morning.  If he sits perfectly still or lays still he has no pain.  With any movement he feels more severe pain that is very sharp in his left lower back.  It is just the left of his lumbar spine.  He denies any radiating pain into the leg.  He denies any numbness, tingling or weakness in his legs.  He denies fever.  He denies any urinary symptoms or gastrointestinal symptoms.  He did fall 2 months ago and landed on his back, but after that he did not have any pain.  He has not tried taking anything for the pain because he is on Xarelto and was not sure what to take.   Medications and allergies reviewed with patient and updated if appropriate.  Patient Active Problem List   Diagnosis Date Noted  . Hives 09/30/2019  . Neck pain 09/24/2018  . Bronchiectasis ([place]) 03/26/2018  . Coronary artery calcification seen on CT scan 03/26/2018  . Mass of left side of neck 03/26/2018  . Biceps tendinitis of right shoulder 12/19/2017  . Abnormal finding on lung imaging 11/26/2017  . Skin lesion 09/19/2017  . Right leg pain 03/25/2017  . Tobacco abuse 03/25/2017  . Plantar fasciitis   . Hypercholesteremia   . HTN (hypertension)   . History of DVT (deep vein thrombosis)   . History of colon polyps   . DJD (degenerative joint disease)   . Diverticulosis of colon   . Cancer of prostate ([place])   . BPH (benign prostatic hyperplasia)   . Anxiety with depression   . Allergic rhinitis 10/17/2016  . Cardiac murmur 09/12/2016  . Chronic anticoagulation 09/12/2016  . Low back pain with right-sided sciatica 07/15/2016  . Localized rash 12/22/2015  . Foot dermatitis 12/22/2015  . Pre-ulcerative corn or callous 12/22/2015  . Cough 11/03/2015  .  General weakness 07/29/2015  . Right hip pain 05/23/2015  . Trigger finger, acquired 11/17/2014  . Bilateral leg pain 03/16/2014  . Sinusitis, acute 06/03/2013  . Chronic bronchitis ([place]) 06/03/2013  . Encounter for therapeutic drug monitoring 03/25/2013  . Pneumonia involving left lung 07/29/2012  . Lumbar degenerative disc disease 11/12/2011  . Atherosclerosis of abdominal aorta ([place]) 11/12/2011  . Sebaceous cyst 11/12/2011  . Wheezing 01/29/2011  . Impaired glucose tolerance 11/03/2010  . Preventative health care 11/03/2010  . PULMONARY EMBOLISM 03/09/2010  . ANKLE PAIN, RIGHT 11/28/2009  . LEG CRAMPS 11/01/2009  . Left-sided chest wall pain 01/17/2009  . INTERNAL HEMORRHOIDS WITHOUT MENTION COMP 03/24/2008  . PLANTAR FASCIITIS 03/24/2008  . FATIGUE 11/05/2007  . Dysuria 11/05/2007  . DIVERTICULOSIS, COLON 10/22/2007  . LEG PAIN, RIGHT 10/21/2007  . COLONIC POLYPS 11/20/2006  . DVT 11/20/2006  . LEG EDEMA 11/20/2006  . BENIGN PROSTATIC HYPERTROPHY, HX OF 11/20/2006    Current Outpatient Medications on File Prior to Visit  Medication Sig Dispense Refill  . allopurinol (ZYLOPRIM) 100 MG tablet Take 1 tablet by mouth once daily 90 tablet 0  . b complex vitamins tablet Take 1 tablet by mouth daily.    . B Complex-Biotin-FA (SUPER QUINTS B-50) TABS Take by mouth.    . Cholecalciferol (  VITAMIN D3) 1000 UNITS CAPS Take 1 Int'l Units by mouth daily.    Marland Kitchen losartan (COZAAR) 25 MG tablet Take 1 tablet by mouth twice daily 180 tablet 0  . simvastatin (ZOCOR) 20 MG tablet TAKE 1 TABLET BY MOUTH AT BEDTIME 90 tablet 3  . XARELTO 20 MG TABS tablet TAKE 1 TABLET BY MOUTH ONCE DAILY WITH SUPPER 90 tablet 0   No current facility-administered medications on file prior to visit.    Past Medical History:  Diagnosis Date  . Anxiety   . Atherosclerosis of abdominal aorta ([place]) 11/12/2011  . BPH (benign prostatic hyperplasia)   . Bronchiectasis ([place]) 03/26/2018  . Cancer of prostate ([place])    . Chronic anticoagulation 09/12/2016  . Coronary artery calcification seen on CT scan 03/26/2018  . Diverticulosis of colon   . DJD (degenerative joint disease)    left foot, "pt denies"  . Glucose intolerance (impaired glucose tolerance) 11/03/2010  . History of colon polyps   . History of DVT (deep vein thrombosis)   . HTN (hypertension)   . Hypercholesteremia   . Lumbar degenerative disc disease 11/12/2011  . Plantar fasciitis     Past Surgical History:  Procedure Laterality Date  . INSERTION PROSTATE RADIATION SEED    . UVULECTOMY      Social History   Socioeconomic History  . Marital status: Married    Spouse name: Not on file  . Number of children: 2  . Years of education: Not on file  . Highest education level: Not on file  Occupational History  . Occupation: retired  Tobacco Use  . Smoking status: Former Smoker    Packs/day: 0.25    Years: 40.00    Pack years: 10.00    Types: Cigarettes    Start date: 02/26/1969    Quit date: 11/23/2017    Years since quitting: 2.1  . Smokeless tobacco: Never Used  Vaping Use  . Vaping Use: Never used  Substance and Sexual Activity  . Alcohol use: No  . Drug use: No  . Sexual activity: Not Currently  Other Topics Concern  . Not on file  Social History Narrative  . Not on file   Social Determinants of Health   Financial Resource Strain:   . Difficulty of Paying Living Expenses: Not on file  Food Insecurity:   . Worried About Charity fundraiser in the Last Year: Not on file  . Ran Out of Food in the Last Year: Not on file  Transportation Needs:   . Lack of Transportation (Medical): Not on file  . Lack of Transportation (Non-Medical): Not on file  Physical Activity:   . Days of Exercise per Week: Not on file  . Minutes of Exercise per Session: Not on file  Stress:   . Feeling of Stress : Not on file  Social Connections:   . Frequency of Communication with Friends and Family: Not on file  . Frequency of Social  Gatherings with Friends and Family: Not on file  . Attends Religious Services: Not on file  . Active Member of Clubs or Organizations: Not on file  . Attends Archivist Meetings: Not on file  . Marital Status: Not on file    Family History  Problem Relation Age of Onset  . Deep vein thrombosis Mother   . Diabetes Mother   . Healthy Brother   . Healthy Brother   . Colon cancer Neg Hx   . Rectal cancer Neg Hx  Review of Systems  Constitutional: Negative for fever.  Gastrointestinal: Negative for abdominal pain, constipation and diarrhea.  Genitourinary: Negative for difficulty urinating, dysuria and hematuria.  Musculoskeletal: Positive for back pain. Negative for myalgias.  Neurological: Negative for weakness and numbness.       Objective:   Vitals:   01/18/20 1128  BP: (!) 142/80  Pulse: 76  Temp: 98.3 F (36.8 C)  SpO2: 98%   BP Readings from Last 3 Encounters:  01/18/20 (!) 142/80  12/30/19 (!) 144/70  12/10/19 134/74   Wt Readings from Last 3 Encounters:  01/18/20 194 lb 6.4 oz (88.2 kg)  12/30/19 193 lb 2 oz (87.6 kg)  12/10/19 192 lb 3.2 oz (87.2 kg)   Body mass index is 29.56 kg/m.   Physical Exam Constitutional:      General: He is not in acute distress.    Appearance: Normal appearance. He is not ill-appearing.  HENT:     Head: Normocephalic and atraumatic.  Musculoskeletal:        General: Tenderness (Slight tenderness with palpation of upper psoas muscle/left edge of lumbar spine.  No significant spine tenderness with palpation.  Minimal pain with sitting still, but severe pain with movement.) present. No swelling or deformity.  Skin:    General: Skin is warm and dry.  Neurological:     General: No focal deficit present.     Mental Status: He is alert.     Sensory: No sensory deficit.     Motor: No weakness.     Gait: Gait normal.            Assessment & Plan:    See Problem List for Assessment and Plan of chronic  medical problems.    This visit occurred during the SARS-CoV-2 public health emergency.  Safety protocols were in place, including screening questions prior to the visit, additional usage of staff PPE, and extensive cleaning of exam room while observing appropriate contact time as indicated for disinfecting solutions.

## 2020-01-19 ENCOUNTER — Ambulatory Visit (INDEPENDENT_AMBULATORY_CARE_PROVIDER_SITE_OTHER)
Admission: RE | Admit: 2020-01-19 | Discharge: 2020-01-19 | Disposition: A | Discharge status: Home / Self Care | Payer: Medicare Other | Source: Ambulatory Visit | Attending: Internal Medicine | Admitting: Internal Medicine

## 2020-01-19 ENCOUNTER — Telehealth: Payer: Self-pay | Admitting: Internal Medicine

## 2020-01-19 DIAGNOSIS — M545 Low back pain, unspecified: Secondary | ICD-10-CM | POA: Diagnosis not present

## 2020-01-19 NOTE — Telephone Encounter (Signed)
His Ct scan shows arthritis changes and mild bulging of a disc in his spine.  No other concerns.  I think he should see Dr Tamala Julian downstairs ( he has seen him before) - he can call him and make an appointment.

## 2020-01-19 NOTE — Progress Notes (Signed)
I, Wendy Poet, LAT, ATC, am serving as scribe for Dr. Lynne Leader.  Kevin Hale is a 79 y.o. male who presents to [place] at [facility] today for L-sided LBP x 3 weeks.  He was last seen by Dr. Tamala Julian on 09/30/19 for his R shoulder.  Since then, he has been experiencing L-sided LBP w/ no known MOI.  Pt states CT report notes arthritis and a bulging disc. Pn w/ walking, getting out of bed in the mornings, getting up from sitting.  Radiating pain: No LE numbness/tingling: No LE weakness: No Aggravating factors: any movement other than sitting or laying still; Treatments tried: Tylenol  Diagnostic testing: CT L-spine- 01/19/20; L-spine XR- 01/18/20  Pertinent review of systems: No fevers or chills  Relevant historical information: History of prostate cancer.   Exam:  BP 124/78 (BP Location: Right Arm, Patient Position: Sitting, Cuff Size: Normal)   Pulse 84   Ht 5\' 8"  (1.727 m)   Wt 194 lb (88 kg)   SpO2 98%   BMI 29.50 kg/m  General: Well Developed, well nourished, and in pain appearing  MSK: L-spine normal-appearing nontender to midline.  Mildly tender palpation left SI joint.  Not particular tender palpation left lateral to posterior abdominal wall near area of pain. Decreased lumbar motion. Lower extremity strength is intact. Left hip normal-appearing not particularly tender to palpation normal hip motion.  Intact hip strength abduction.  Significant antalgic gait present.    Lab and Radiology Results No results found for this or any previous visit (from the past 72 hour(s)). DG Lumbar Spine Complete  Result Date: 01/18/2020 CLINICAL DATA:  LEFT side pain for 3 weeks, fell 2 months ago, no recent injury EXAM: LUMBAR SPINE - COMPLETE 4+ VIEW COMPARISON:  12/19/2017 FINDINGS: 5 non-rib-bearing lumbar vertebra. Bones demineralized. Facet degenerative changes lower lumbar spine. Vertebral body heights maintained. No fracture, subluxation or bone  destruction. No spondylolysis. SI joints preserved. Atherosclerotic calcifications in pelvis as well as at abdominal aorta. IMPRESSION: No definite acute osseous abnormalities. Aortic Atherosclerosis (ICD10-I70.0). Electronically Signed   By: Lavonia Dana M.D.   On: 01/18/2020 12:30   CT Lumbar Spine Wo Contrast  Result Date: 01/19/2020 CLINICAL DATA:  Low back pain. Left paraspinal pain, 3 weeks duration with worsening. Patient anticoagulated. Trauma to the back 2 months ago. EXAM: CT LUMBAR SPINE WITHOUT CONTRAST TECHNIQUE: Multidetector CT imaging of the lumbar spine was performed without intravenous contrast administration. Multiplanar CT image reconstructions were also generated. COMPARISON:  Radiography yesterday.  Radiography 12/19/2017. FINDINGS: Segmentation: 5 lumbar type vertebral bodies. Alignment: 2 mm degenerative anterolisthesis L4-5. Vertebrae: No fracture or primary bone lesion. Paraspinal and other soft tissues: Aortic atherosclerotic calcification. Disc levels: No significant finding at L3-4 or above. Minimal disc bulges. Minimal facet degeneration. No canal or foraminal stenosis. L4-5: More advanced facet osteoarthritis with 2 mm of degenerative anterolisthesis. Mild bulging of the disc. Mild stenosis of both lateral recesses. Findings at this level could relate to low back pain or referred facet syndrome pain. Some potential that either L5 nerve root could be irritated in the lateral recess. L5-S1: Endplate osteophytes and mild bulging of the disc. Bilateral facet osteoarthritis right worse than left. No canal stenosis. Foramina appear sufficiently patent. Findings could relate to low back pain or referred facet syndrome pain. Chronic sacroiliac joint osteoarthritis noted on the right. IMPRESSION: 1. L4-5: Facet osteoarthritis with 2 mm of degenerative anterolisthesis. Mild bulging of the disc. Mild stenosis of both lateral recesses.  Findings at this level could relate to low back pain or  referred facet syndrome pain. Some potential that either L5 nerve root could be irritated in the lateral recess. 2. L5-S1: Endplate osteophytes and mild bulging of the disc. Bilateral facet osteoarthritis right worse than left. No canal or foraminal stenosis. Findings could relate to low back pain or referred facet syndrome pain. 3. 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 4. Aortic atherosclerosis. Aortic Atherosclerosis (ICD10-I70.0). Electronically Signed   By: Nelson Chimes M.D.   On: 01/19/2020 11:56   I, Lynne Leader, personally (independently) visualized and performed the interpretation of the images attached in this note.  X-ray images thoracic spine and left hip obtained today personally and independently interpreted  T-spine: No severe narrowing or severe degeneration lower portion of thoracic spine.  No visible compression fractures.  Left hip: Mild left hip DJD.  Significant osteophyte formation at iliac crest origin of hip abductor muscles.  No clear fracture or obvious tumor involvement iliac crest on x-ray visible today.  Await formal radiology review    Assessment and Plan: 79 y.o. male with left flank pain ongoing for months but severe over the last month or so.  Pain is quite severe and limiting his ability to walk normally.  However his exam is strangely benign.  He is not particularly tender palpation along the muscle or bony prominences in the area of pain on the left low back and flank.  He has intact strength to hip abduction without severe pain.  He does have a pertinent history for prostate cancer.  Given the severity of his pain and so far unclear explanation I think next of his CT scan with contrast of the abdomen and pelvis.  Assuming this is normal would proceed with further evaluation work-up as well.  We will schedule patient for check back early next week.  Limited tramadol for pain control in the interim.   PDMP reviewed during this encounter. Orders  Placed This Encounter  Procedures  . DG HIP UNILAT WITH PELVIS 2-3 VIEWS LEFT    Standing Status:   Future    Number of Occurrences:   1    Standing Expiration Date:   01/19/2021    Order Specific Question:   Reason for Exam (SYMPTOM  OR DIAGNOSIS REQUIRED)    Answer:   eval left hip pain    Order Specific Question:   Preferred imaging location?    Answer:   [facility]  . DG Thoracic Spine 2 View    Standing Status:   Future    Number of Occurrences:   1    Standing Expiration Date:   01/19/2021    Order Specific Question:   Reason for Exam (SYMPTOM  OR DIAGNOSIS REQUIRED)    Answer:   possible T11 radiculopathy left    Order Specific Question:   Preferred imaging location?    Answer:   [facility]  . CT ABDOMEN PELVIS W CONTRAST    Standing Status:   Future    Standing Expiration Date:   01/19/2021    Order Specific Question:   If indicated for the ordered procedure, I authorize the administration of contrast media per Radiology protocol    Answer:   Yes    Order Specific Question:   Preferred imaging location?    Answer:   [facility]    Order Specific Question:   Is Oral Contrast requested for this exam?    Answer:  Yes, Per Radiology protocol  . Ambulatory referral to Physical Therapy    Referral Priority:   Routine    Referral Type:   Physical Medicine    Referral Reason:   Specialty Services Required    Requested Specialty:   Physical Therapy    Number of Visits Requested:   1   Meds ordered this encounter  Medications  . traMADol (ULTRAM) 50 MG tablet    Sig: Take 1 tablet (50 mg total) by mouth every 8 (eight) hours as needed for severe pain.    Dispense:  15 tablet    Refill:  0     Discussed warning signs or symptoms. Please see discharge instructions. Patient expresses understanding.   The above documentation has been reviewed and is accurate and complete Lynne Leader, M.D.

## 2020-01-19 NOTE — Telephone Encounter (Signed)
Spoke with patient and info given.  Number given to Sports medicine to call and make appointment.

## 2020-01-20 ENCOUNTER — Ambulatory Visit: Payer: Medicare Other | Admitting: Family Medicine

## 2020-01-20 ENCOUNTER — Ambulatory Visit (INDEPENDENT_AMBULATORY_CARE_PROVIDER_SITE_OTHER): Payer: Medicare Other

## 2020-01-20 ENCOUNTER — Other Ambulatory Visit: Payer: Self-pay

## 2020-01-20 VITALS — BP 124/78 | HR 84 | Ht 68.0 in | Wt 194.0 lb

## 2020-01-20 DIAGNOSIS — R1032 Left lower quadrant pain: Secondary | ICD-10-CM | POA: Diagnosis not present

## 2020-01-20 DIAGNOSIS — R109 Unspecified abdominal pain: Secondary | ICD-10-CM

## 2020-01-20 DIAGNOSIS — M25552 Pain in left hip: Secondary | ICD-10-CM | POA: Diagnosis not present

## 2020-01-20 DIAGNOSIS — C61 Malignant neoplasm of prostate: Secondary | ICD-10-CM

## 2020-01-20 DIAGNOSIS — M545 Low back pain, unspecified: Secondary | ICD-10-CM | POA: Diagnosis not present

## 2020-01-20 MED ORDER — TRAMADOL HCL 50 MG PO TABS
50.0000 mg | ORAL_TABLET | Freq: Three times a day (TID) | ORAL | 0 refills | Status: DC | PRN
Start: 2020-01-20 — End: 2021-10-04

## 2020-01-20 NOTE — Progress Notes (Signed)
X-ray thoracic spine looks normal to radiology  Based on normal x-rays I have ordered a CT scan of the abdomen and pelvis.

## 2020-01-20 NOTE — Patient Instructions (Addendum)
Thank you for coming in today.  Please get an Xray today before you leave  I've referred you to Physical Therapy.  Let us know if you don't hear from them in one week.  Use tramadol for pain sparingly.   Recheck early next week.

## 2020-01-20 NOTE — Progress Notes (Signed)
X-ray bilateral hips show a little arthritis otherwise looks normal

## 2020-01-25 ENCOUNTER — Other Ambulatory Visit: Payer: Medicare Other

## 2020-02-03 ENCOUNTER — Other Ambulatory Visit: Payer: Self-pay

## 2020-02-03 ENCOUNTER — Ambulatory Visit (HOSPITAL_BASED_OUTPATIENT_CLINIC_OR_DEPARTMENT_OTHER): Payer: Medicare Other | Attending: Internal Medicine | Admitting: Cardiology

## 2020-02-03 DIAGNOSIS — R0683 Snoring: Secondary | ICD-10-CM

## 2020-02-03 DIAGNOSIS — G4733 Obstructive sleep apnea (adult) (pediatric): Secondary | ICD-10-CM | POA: Diagnosis not present

## 2020-02-07 NOTE — Procedures (Signed)
   Patient Name: Kevin Hale, Kevin Hale Date: 02/03/2020 Gender: Male D.O.B: 1940/05/15 Age (years): 44 Referring Provider: Dorris Carnes Height (inches): 54 Interpreting Physician: Fransico Him MD, ABSM Weight (lbs): 194 RPSGT: Jacolyn Reedy BMI: 29 MRN: [id number] Neck Size: 17.00  CLINICAL INFORMATION Sleep Study Type: HST  Indication for sleep study: N/A  Epworth Sleepiness Score: 5  SLEEP STUDY TECHNIQUE A multi-channel overnight portable sleep study was performed. The channels recorded were: nasal airflow, thoracic respiratory movement, and oxygen saturation with a pulse oximetry. Snoring was also monitored.  MEDICATIONS Patient self administered medications include: N/A.  SLEEP ARCHITECTURE Patient was studied for 377.5 minutes. The sleep efficiency was 100.0 % and the patient was supine for 98.3%. The arousal index was 0.0 per hour.  RESPIRATORY PARAMETERS The overall AHI was 46.1 per hour, with a central apnea index of 0.0 per hour.  The oxygen nadir was 83% during sleep.  CARDIAC DATA Mean heart rate during sleep was 71.9 bpm.  IMPRESSIONS - Severe obstructive sleep apnea occurred during this study (AHI = 46.1/h). - No significant central sleep apnea occurred during this study (CAI = 0.0/h). - Moderate oxygen desaturation was noted during this study (Min O2 = 83%). - Patient snored 0.5% during the sleep.  DIAGNOSIS - Obstructive Sleep Apnea (G47.33) - Nocturnal Hypoxemia (G47.36  RECOMMENDATIONS - Recommend in lab CPAP titration due to severity of sleep disordered breathing and hypoxemia. - Positional therapy avoiding supine position during sleep. - Avoid alcohol, sedatives and other CNS depressants that may worsen sleep apnea and disrupt normal sleep architecture. - Sleep hygiene should be reviewed to assess factors that may improve sleep quality. - Weight management and regular exercise should be initiated or continued.  [Electronically signed]  02/07/2020 01:48 PM  Fransico Him MD, ABSM Diplomate, American Board of Sleep Medicine

## 2020-02-10 ENCOUNTER — Telehealth: Payer: Self-pay | Admitting: *Deleted

## 2020-02-10 DIAGNOSIS — G4733 Obstructive sleep apnea (adult) (pediatric): Secondary | ICD-10-CM

## 2020-02-10 NOTE — Telephone Encounter (Signed)
Informed patient of sleep study results and patient understanding was verbalized. Patient understands his sleep study showed they have sleep apnea and recommend in lab CPAP titration. Pt is aware and agreeable to his results.  Titration sent to sleep pool

## 2020-02-10 NOTE — Telephone Encounter (Signed)
-----   Message from Sueanne Margarita, MD sent at 02/07/2020  1:51 PM EST ----- Please let patient know that they have sleep apnea and recommend in lab CPAP titration

## 2020-02-12 ENCOUNTER — Other Ambulatory Visit: Payer: Self-pay | Admitting: Internal Medicine

## 2020-02-16 NOTE — Telephone Encounter (Signed)
NO PA REQUIRED

## 2020-02-18 ENCOUNTER — Other Ambulatory Visit: Payer: Self-pay

## 2020-02-18 ENCOUNTER — Encounter: Payer: Self-pay | Admitting: Internal Medicine

## 2020-02-18 ENCOUNTER — Ambulatory Visit (AMBULATORY_SURGERY_CENTER): Payer: Medicare Other | Admitting: Internal Medicine

## 2020-02-18 VITALS — BP 119/57 | HR 63 | Temp 97.8°F | Resp 17 | Ht 68.0 in | Wt 193.0 lb

## 2020-02-18 DIAGNOSIS — Z86010 Personal history of colonic polyps: Secondary | ICD-10-CM | POA: Diagnosis not present

## 2020-02-18 DIAGNOSIS — D124 Benign neoplasm of descending colon: Secondary | ICD-10-CM

## 2020-02-18 DIAGNOSIS — Z7901 Long term (current) use of anticoagulants: Secondary | ICD-10-CM

## 2020-02-18 DIAGNOSIS — D122 Benign neoplasm of ascending colon: Secondary | ICD-10-CM | POA: Diagnosis not present

## 2020-02-18 MED ORDER — SODIUM CHLORIDE 0.9 % IV SOLN: 500.0000 mL | Freq: Once | INTRAVENOUS | Status: DC

## 2020-02-18 NOTE — Progress Notes (Signed)
PT taken to PACU. Monitors in place. VSS. Report given to RN. 

## 2020-02-18 NOTE — Progress Notes (Signed)
Medical history reviewed with no changes noted. VS assessed by C.W 

## 2020-02-18 NOTE — Op Note (Signed)
[place] Patient Name: Kevin Hale Procedure Date: 02/18/2020 3:08 PM MRN: [id number] Endoscopist: Docia Chuck. Henrene Pastor , MD Age: 79 Referring MD:  Date of Birth: 10-31-1940 Gender: Male Account #: [id number] Procedure:                Colonoscopy with cold snare polypectomy x 3 Indications:              High risk colon cancer surveillance: Personal                            history of multiple (3 or more) adenomas. Index                            exam approximately 20 years ago in [place].                            Subsequent examinations 2008, 2013, 2018 (7 polyps) Medicines:                Monitored Anesthesia Care Procedure:                Pre-Anesthesia Assessment:                           - Prior to the procedure, a History and Physical                            was performed, and patient medications and                            allergies were reviewed. The patient's tolerance of                            previous anesthesia was also reviewed. The risks                            and benefits of the procedure and the sedation                            options and risks were discussed with the patient.                            All questions were answered, and informed consent                            was obtained. Prior Anticoagulants: The patient has                            taken Xarelto (rivaroxaban), last dose was 1 day                            prior to procedure. ASA Grade Assessment: III - A                            patient with severe systemic disease. After  reviewing the risks and benefits, the patient was                            deemed in satisfactory condition to undergo the                            procedure.                           After obtaining informed consent, the colonoscope                            was passed under direct vision. Throughout the                            procedure, the patient's  blood pressure, pulse, and                            oxygen saturations were monitored continuously. The                            Olympus CF-HQ190L [id number]) Colonoscope was                            introduced through the anus and advanced to the the                            cecum, identified by appendiceal orifice and                            ileocecal valve. The ileocecal valve, appendiceal                            orifice, and rectum were photographed. The quality                            of the bowel preparation was good. The colonoscopy                            was performed without difficulty. The patient                            tolerated the procedure well. The bowel preparation                            used was SUPREP/tablets via split dose instruction. Scope In: 3:11:15 PM Scope Out: 3:25:59 PM Scope Withdrawal Time: 0 hours 10 minutes 4 seconds  Total Procedure Duration: 0 hours 14 minutes 44 seconds  Findings:                 Three polyps were found in the ascending colon. The                            polyps were 2 to 3 mm in size. These polyps were  removed with a cold snare. Resection and retrieval                            were complete.                           Multiple diverticula were found in the left colon                            and right colon.                           The exam was otherwise without abnormality on                            direct and retroflexion views. Complications:            No immediate complications. Estimated blood loss:                            None. Estimated Blood Loss:     Estimated blood loss: none. Impression:               - Three 2 to 3 mm polyps in the ascending colon,                            removed with a cold snare. Resected and retrieved.                           - Diverticulosis in the left colon and in the right                            colon.                            - The examination was otherwise normal on direct                            and retroflexion views. Recommendation:           - Repeat colonoscopy is not recommended for                            surveillance.                           - Resume Xarelto (rivaroxaban) today at prior dose.                           - Patient has a contact number available for                            emergencies. The signs and symptoms of potential                            delayed complications were discussed with the  patient. Return to normal activities tomorrow.                            Written discharge instructions were provided to the                            patient.                           - Resume previous diet.                           - Continue present medications.                           - Await pathology results. Docia Chuck. Henrene Pastor, MD 02/18/2020 3:33:24 PM This report has been signed electronically.

## 2020-02-18 NOTE — Patient Instructions (Signed)
Handouts provided on polyps and diverticulosis.   Resume Xarelto (rivaroxaban) today at prior dose.    Repeat colonoscopy is not recommended for surveillance based on age.   YOU HAD AN ENDOSCOPIC PROCEDURE TODAY AT [facility]:   Refer to the procedure report that was given to you for any specific questions about what was found during the examination.  If the procedure report does not answer your questions, please call your gastroenterologist to clarify.  If you requested that your care partner not be given the details of your procedure findings, then the procedure report has been included in a sealed envelope for you to review at your convenience later.  YOU SHOULD EXPECT: Some feelings of bloating in the abdomen. Passage of more gas than usual.  Walking can help get rid of the air that was put into your GI tract during the procedure and reduce the bloating. If you had a lower endoscopy (such as a colonoscopy or flexible sigmoidoscopy) you may notice spotting of blood in your stool or on the toilet paper. If you underwent a bowel prep for your procedure, you may not have a normal bowel movement for a few days.  Please Note:  You might notice some irritation and congestion in your nose or some drainage.  This is from the oxygen used during your procedure.  There is no need for concern and it should clear up in a day or so.  SYMPTOMS TO REPORT IMMEDIATELY:   Following lower endoscopy (colonoscopy or flexible sigmoidoscopy):  Excessive amounts of blood in the stool  Significant tenderness or worsening of abdominal pains  Swelling of the abdomen that is new, acute  Fever of 100F or higher  For urgent or emergent issues, a gastroenterologist can be reached at any hour by calling [phone]. Do not use MyChart messaging for urgent concerns.    DIET:  We do recommend a small meal at first, but then you may proceed to your regular diet.  Drink plenty of fluids but you should  avoid alcoholic beverages for 24 hours.  ACTIVITY:  You should plan to take it easy for the rest of today and you should NOT DRIVE or use heavy machinery until tomorrow (because of the sedation medicines used during the test).    FOLLOW UP: Our staff will call the number listed on your records 48-72 hours following your procedure to check on you and address any questions or concerns that you may have regarding the information given to you following your procedure. If we do not reach you, we will leave a message.  We will attempt to reach you two times.  During this call, we will ask if you have developed any symptoms of COVID 19. If you develop any symptoms (ie: fever, flu-like symptoms, shortness of breath, cough etc.) before then, please call [phone].  If you test positive for Covid 19 in the 2 weeks post procedure, please call and report this information to [place].    If any biopsies were taken you will be contacted by phone or by letter within the next 1-3 weeks.  Please call us at [phone] if you have not heard about the biopsies in 3 weeks.    SIGNATURES/CONFIDENTIALITY: You and/or your care partner have signed paperwork which will be entered into your electronic medical record.  These signatures attest to the fact that that the information above on your After Visit Summary has been reviewed and is understood.  Full responsibility of the  confidentiality of this discharge information lies with you and/or your care-partner.

## 2020-02-22 ENCOUNTER — Telehealth: Payer: Self-pay

## 2020-02-22 NOTE — Telephone Encounter (Signed)
NO ANSWER, MESSAGE LEFT FOR PATIENT. 

## 2020-02-24 ENCOUNTER — Encounter: Payer: Self-pay | Admitting: Internal Medicine

## 2020-02-29 DIAGNOSIS — R3915 Urgency of urination: Secondary | ICD-10-CM | POA: Diagnosis not present

## 2020-03-30 ENCOUNTER — Other Ambulatory Visit: Payer: Self-pay

## 2020-03-31 ENCOUNTER — Ambulatory Visit (INDEPENDENT_AMBULATORY_CARE_PROVIDER_SITE_OTHER): Payer: Medicare Other | Admitting: Internal Medicine

## 2020-03-31 ENCOUNTER — Other Ambulatory Visit: Payer: Self-pay

## 2020-03-31 ENCOUNTER — Encounter: Payer: Self-pay | Admitting: Internal Medicine

## 2020-03-31 VITALS — BP 122/70 | HR 92 | Temp 98.8°F | Ht 68.0 in | Wt 193.0 lb

## 2020-03-31 DIAGNOSIS — Z0000 Encounter for general adult medical examination without abnormal findings: Secondary | ICD-10-CM

## 2020-03-31 DIAGNOSIS — I700 Atherosclerosis of aorta: Secondary | ICD-10-CM | POA: Diagnosis not present

## 2020-03-31 NOTE — Patient Instructions (Addendum)
Please continue all other medications as before, and refills have been done if requested.  Please have the pharmacy call with any other refills you may need.  Please continue your efforts at being more active, low cholesterol diet, and weight control.  You are otherwise up to date with prevention measures today.  Please keep your appointments with your specialists as you may have planned   

## 2020-03-31 NOTE — Progress Notes (Signed)
Established Patient Office Visit  Subjective:  Patient ID: Kevin Hale, male    DOB: [birth year]  Age: 80 y.o. MRN: [id number]        Chief Complaint:: wellness exam        HPI:  Kevin Hale is a 80 y.o. male here for wellness exam; no complaints and o/w up to date   Wt Readings from Last 3 Encounters:  03/31/20 193 lb (87.5 kg)  02/18/20 193 lb (87.5 kg)  02/03/20 194 lb (88 kg)   BP Readings from Last 3 Encounters:  03/31/20 122/70  02/18/20 (!) 119/57  01/20/20 124/78   Immunization History  Administered Date(s) Administered  . Fluad Quad(high Dose 65+) 10/30/2018  . Influenza Whole 11/28/2009, 11/06/2010, 11/10/2011  . Influenza, High Dose Seasonal PF 11/27/2014, 12/31/2014, 10/17/2016, 10/26/2017  . Influenza,inj,Quad PF,6+ Mos 11/11/2012, 12/22/2015, 12/18/2019  . Influenza-Unspecified 12/04/2002, 11/27/2003, 12/28/2003, 10/23/2010, 12/28/2011, 10/27/2013, 11/30/2014, 10/28/2018  . Moderna Sars-Covid-2 Vaccination 04/11/2019, 05/09/2019, 01/04/2020  . Pneumococcal Conjugate-13 11/11/2012, 06/15/2014  . Pneumococcal Polysaccharide-23 02/28/2004, 11/13/2012  . Pneumococcal-Unspecified 01/05/2004, 12/28/2010  . Td 02/27/2000  . Tdap 06/15/2014  . Tetanus 05/09/2012  . Zoster 05/19/2013  . Zoster Recombinat (Shingrix) 01/29/2018, 05/07/2018  There are no preventive care reminders to display for this patient.        Past Medical History:  Diagnosis Date  . Anxiety   . Atherosclerosis of abdominal aorta ([place]) 11/12/2011  . BPH (benign prostatic hyperplasia)   . Bronchiectasis ([place]) 03/26/2018  . Cancer of prostate ([place])   . Chronic anticoagulation 09/12/2016  . Coronary artery calcification seen on CT scan 03/26/2018  . Diverticulosis of colon   . DJD (degenerative joint disease)    left foot, "pt denies"  . Glucose intolerance (impaired glucose tolerance) 11/03/2010  . History of colon polyps   . History of DVT (deep vein thrombosis)   . HTN (hypertension)   .  Hypercholesteremia   . Lumbar degenerative disc disease 11/12/2011  . Plantar fasciitis   . Sleep apnea    Past Surgical History:  Procedure Laterality Date  . INSERTION PROSTATE RADIATION SEED    . UVULECTOMY      reports that he quit smoking about 2 years ago. His smoking use included cigarettes. He started smoking about 51 years ago. He has a 10.00 pack-year smoking history. He has never used smokeless tobacco. He reports that he does not drink alcohol and does not use drugs. family history includes Deep vein thrombosis in his mother; Diabetes in his mother; Healthy in his brother and brother. Allergies  Allergen Reactions  . Pravastatin Other (See Comments)    myalgia   Current Outpatient Medications on File Prior to Visit  Medication Sig Dispense Refill  . allopurinol (ZYLOPRIM) 100 MG tablet Take 1 tablet by mouth once daily 90 tablet 0  . b complex vitamins tablet Take 1 tablet by mouth daily.    . B Complex-Biotin-FA (SUPER QUINTS B-50) TABS Take by mouth.    . Cholecalciferol (VITAMIN D3) 1000 UNITS CAPS Take 1 Int'l Units by mouth daily.    Marland Kitchen losartan (COZAAR) 25 MG tablet Take 1 tablet by mouth twice daily 180 tablet 3  . simvastatin (ZOCOR) 20 MG tablet TAKE 1 TABLET BY MOUTH AT BEDTIME 90 tablet 3  . XARELTO 20 MG TABS tablet TAKE 1 TABLET BY MOUTH ONCE DAILY WITH SUPPER 90 tablet 0  . traMADol (ULTRAM) 50 MG tablet Take 1 tablet (50 mg total) by mouth every 8 (  eight) hours as needed for severe pain. (Patient not taking: Reported on 03/31/2020) 15 tablet 0   No current facility-administered medications on file prior to visit.        ROS:  All others reviewed and negative.  Objective        PE:  BP 122/70   Pulse 92   Temp 98.8 F (37.1 C) (Oral)   Ht 5\' 8"  (1.727 m)   Wt 193 lb (87.5 kg)   SpO2 97%   BMI 29.35 kg/m                 Constitutional: Pt appears in NAD               HENT: Head: NCAT.                Right Ear: External ear normal.                  Left Ear: External ear normal.                Eyes: . Pupils are equal, round, and reactive to light. Conjunctivae and EOM are normal               Nose: without d/c or deformity               Neck: Neck supple. Gross normal ROM               Cardiovascular: Normal rate and regular rhythm.                 Pulmonary/Chest: Effort normal and breath sounds without rales or wheezing.                Abd:  Soft, NT, ND, + BS, no organomegaly               Neurological: Pt is alert. At baseline orientation, motor grossly intact               Skin: Skin is warm. No rashes, no other new lesions, LE edema - none               Psychiatric: Pt behavior is normal without agitation   Micro: none  Cardiac tracings I have personally interpreted today:  none  Pertinent Radiological findings (summarize): none   Lab Results  Component Value Date   WBC 6.6 09/30/2019   HGB 16.4 09/30/2019   HCT 52.2 (H) 09/30/2019   PLT 250 09/30/2019   GLUCOSE 58 (L) 09/30/2019   CHOL 117 09/30/2019   TRIG 80 09/30/2019   HDL 56 09/30/2019   LDLCALC 45 09/30/2019   ALT 16 09/30/2019   AST 19 09/30/2019   NA 141 09/30/2019   K 4.5 09/30/2019   CL 103 09/30/2019   CREATININE 1.08 09/30/2019   BUN 9 09/30/2019   CO2 27 09/30/2019   TSH 0.96 03/31/2019   PSA 0.00 (L) 03/31/2019   INR 2.1 05/28/2017   HGBA1C 5.2 09/30/2019   Assessment/Plan:  Kevin Hale is a 80 y.o. Black or African American [2] male with  has a past medical history of Anxiety, Atherosclerosis of abdominal aorta ([place]) (11/12/2011), BPH (benign prostatic hyperplasia), Bronchiectasis ([place]) (03/26/2018), Cancer of prostate ([place]), Chronic anticoagulation (09/12/2016), Coronary artery calcification seen on CT scan (03/26/2018), Diverticulosis of colon, DJD (degenerative joint disease), Glucose intolerance (impaired glucose tolerance) (11/03/2010), History of colon polyps, History of DVT (deep vein thrombosis), HTN (hypertension), Hypercholesteremia, Lumbar  degenerative  disc disease (11/12/2011), Plantar fasciitis, and Sleep apnea. Aortic atherosclerosis ([facility]) To continue zocor and xarelto,  to f/u any worsening symptoms or concerns  Preventative health care Age and sex appropriate education and counseling updated with regular exercise and diet Referrals for preventative services - none needed Immunizations addressed - none needed Smoking counseling  - none needed Evidence for depression or other mood disorder - none significant Most recent labs reviewed. I have personally reviewed and have noted: 1) the patient's medical and social history 2) The patient's current medications and supplements 3) The patient's height, weight, and BMI have been recorded in the chart   Followup: Return in about 6 months (around 09/28/2020).  Cathlean Cower, MD 04/03/2020 6:14 AM [facility]

## 2020-04-01 ENCOUNTER — Telehealth: Payer: Self-pay | Admitting: Internal Medicine

## 2020-04-01 NOTE — Telephone Encounter (Signed)
° ° °  Patient calling with questions regarding AVS from 03/31/20. He states in [facility] the note says "go to ED, low oxygen"  Please call patient

## 2020-04-01 NOTE — Telephone Encounter (Signed)
Patient notified that message was mistake.

## 2020-04-03 ENCOUNTER — Encounter: Payer: Self-pay | Admitting: Internal Medicine

## 2020-04-03 NOTE — Assessment & Plan Note (Signed)
To continue zocor and xarelto,  to f/u any worsening symptoms or concerns

## 2020-04-03 NOTE — Assessment & Plan Note (Signed)

## 2020-04-05 DIAGNOSIS — H40013 Open angle with borderline findings, low risk, bilateral: Secondary | ICD-10-CM | POA: Diagnosis not present

## 2020-04-09 ENCOUNTER — Ambulatory Visit (HOSPITAL_BASED_OUTPATIENT_CLINIC_OR_DEPARTMENT_OTHER): Payer: Medicare Other | Attending: Cardiovascular Disease | Admitting: Cardiovascular Disease

## 2020-04-09 ENCOUNTER — Other Ambulatory Visit: Payer: Self-pay

## 2020-04-09 DIAGNOSIS — G4733 Obstructive sleep apnea (adult) (pediatric): Secondary | ICD-10-CM | POA: Insufficient documentation

## 2020-04-11 ENCOUNTER — Other Ambulatory Visit: Payer: Self-pay

## 2020-04-11 ENCOUNTER — Other Ambulatory Visit (HOSPITAL_BASED_OUTPATIENT_CLINIC_OR_DEPARTMENT_OTHER): Payer: Self-pay

## 2020-04-11 DIAGNOSIS — G4733 Obstructive sleep apnea (adult) (pediatric): Secondary | ICD-10-CM

## 2020-04-24 ENCOUNTER — Encounter (HOSPITAL_BASED_OUTPATIENT_CLINIC_OR_DEPARTMENT_OTHER): Payer: Self-pay | Admitting: Cardiovascular Disease

## 2020-04-24 NOTE — Procedures (Signed)
Patient Name: Kayvion, [place]on Date: 04/09/2020 Gender: Male D.O.B: September 09, 1940 Age (years): 36 Referring Provider: Shelva Majestic MD, ABSM Height (inches): 69 Interpreting Physician: Shelva Majestic MD, ABSM Weight (lbs): 195 RPSGT: Zadie Rhine BMI: 29 MRN: [id number] Neck Size: 17.00  CLINICAL INFORMATION The patient is referred for a CPAP titration to treat sleep apnea.  Date of HST: 02/03/2020: AHI 46.1/h; O2 nadir 83%.  SLEEP STUDY TECHNIQUE As per the AASM Manual for the Scoring of Sleep and Associated Events v2.3 (April 2016) with a hypopnea requiring 4% desaturations.  The channels recorded and monitored were frontal, central and occipital EEG, electrooculogram (EOG), submentalis EMG (chin), nasal and oral airflow, thoracic and abdominal wall motion, anterior tibialis EMG, snore microphone, electrocardiogram, and pulse oximetry. Continuous positive airway pressure (CPAP) was initiated at the beginning of the study and titrated to treat sleep-disordered breathing.  MEDICATIONS allopurinol (ZYLOPRIM) 100 MG tablet b complex vitamins tablet B Complex-Biotin-FA (SUPER QUINTS B-50) TABS Cholecalciferol (VITAMIN D3) 1000 UNITS CAPS losartan (COZAAR) 25 MG tablet simvastatin (ZOCOR) 20 MG tablet traMADol (ULTRAM) 50 MG tablet XARELTO 20 MG TABS tablet Medications self-administered by patient taken the night of the study : N/A  TECHNICIAN COMMENTS Comments added by technician: [place]. Patient had difficulty initiating sleep. Comments added by scorer: N/A  RESPIRATORY PARAMETERS Optimal PAP Pressure (cm): 17 AHI at Optimal Pressure (/hr): 0.0 Overall Minimal O2 (%): 89.0 Supine % at Optimal Pressure (%): 100 Minimal O2 at Optimal Pressure (%): 93.0   SLEEP ARCHITECTURE The study was initiated at 10:55:36 PM and ended at 5:30:53 AM.  Sleep onset time was 18.5 minutes and the sleep efficiency was 91.7%%. The total sleep time was 362.5 minutes.  The  patient spent 3.6%% of the night in stage N1 sleep, 71.3%% in stage N2 sleep, 0.1%% in stage N3 and 25% in REM.Stage REM latency was 88.5 minutes  Wake after sleep onset was 14.3. Alpha intrusion was absent. Supine sleep was 100.00%.  CARDIAC DATA The 2 lead EKG demonstrated sinus rhythm. The mean heart rate was 74.0 beats per minute. Other EKG findings include: None.  LEG MOVEMENT DATA The total Periodic Limb Movements of Sleep (PLMS) were 0. The PLMS index was 0.0. A PLMS index of <15 is considered normal in adults.  IMPRESSIONS - CPAP was initiated at 6 and was titrated to optimal pressure at 17 cm of water; AHI 0; O2 nadir 93%. - Central sleep apnea was not noted during this titration (CAI = 0.5/h). - Mild oxygen desaturations to nadir of 89.0% at 12 cm. - No snoring was audible during this study. - No cardiac abnormalities were observed during this study. - Clinically significant periodic limb movements were not noted during this study. Arousals associated with PLMs were rare.  DIAGNOSIS - Obstructive Sleep Apnea (G47.33)  RECOMMENDATIONS - Recommend an initial trial of CPAP Auto therapy with EPR of 3 at 15 - 20 cm H2O with heated humidification. A Large size Resmed Full Face Mask AirFit F20 mask was used for the titration. - Effort should be made to optimize nasal and oropharyngeal patency.  - Avoid alcohol, sedatives and other CNS depressants that may worsen sleep apnea and disrupt normal sleep architecture. - Sleep hygiene should be reviewed to assess factors that may improve sleep quality. - Weight management and regular exercise should be initiated or continued. - Recommend a download in 30 days and sleep clinic evaluation after 4 weeks of therapy  [Electronically signed] 04/24/2020 12:09 PM  Marcello Moores  Claiborne Billings MD, [facility], [place], American Board of Sleep Medicine   NPI: [id number] [place] PH: [phone]   FX: [phone] [place]

## 2020-04-27 ENCOUNTER — Telehealth: Payer: Self-pay | Admitting: *Deleted

## 2020-04-27 NOTE — Telephone Encounter (Signed)
Patient informed sleep study has been completed. CPAP orders will be sent to [facility].

## 2020-05-30 ENCOUNTER — Telehealth: Payer: Self-pay | Admitting: *Deleted

## 2020-05-30 NOTE — Telephone Encounter (Signed)
Received a call from the patient wanting to know why he has not gotten his CPAP machine. I again explained to the patient the company is working the orders as they were received. There's still a shortage of the machines. [facility]'s contact information given to patient to call them periodically to check on the status of the machine. He was notified as of last week they are working on orders received in December. His order was sent in March.

## 2020-06-06 ENCOUNTER — Other Ambulatory Visit: Payer: Self-pay | Admitting: Internal Medicine

## 2020-06-06 NOTE — Telephone Encounter (Signed)
Please refill as per office routine med refill policy (all routine meds refilled for 3 mo or monthly per pt preference up to one year from last visit, then month to month grace period for 3 mo, then further med refills will have to be denied)  

## 2020-06-19 ENCOUNTER — Other Ambulatory Visit: Payer: Self-pay | Admitting: Family Medicine

## 2020-09-02 ENCOUNTER — Other Ambulatory Visit: Payer: Self-pay | Admitting: Internal Medicine

## 2020-09-02 NOTE — Telephone Encounter (Signed)
Please refill as per office routine med refill policy (all routine meds refilled for 3 mo or monthly per pt preference up to one year from last visit, then month to month grace period for 3 mo, then further med refills will have to be denied)  

## 2020-09-09 ENCOUNTER — Other Ambulatory Visit: Payer: Self-pay

## 2020-09-09 ENCOUNTER — Ambulatory Visit (INDEPENDENT_AMBULATORY_CARE_PROVIDER_SITE_OTHER): Payer: Medicare Other

## 2020-09-09 VITALS — BP 124/70 | HR 83 | Temp 98.3°F | Ht 69.0 in | Wt 197.4 lb

## 2020-09-09 DIAGNOSIS — Z0000 Encounter for general adult medical examination without abnormal findings: Secondary | ICD-10-CM

## 2020-09-09 NOTE — Progress Notes (Signed)
Subjective:   Kevin Hale is a 80 y.o. male who presents for Medicare Annual/Subsequent preventive examination.  Review of Systems     Cardiac Risk Factors include: advanced age (>48men, >74 women);dyslipidemia;hypertension;male gender     Objective:    Today's Vitals   09/09/20 1030 09/09/20 1037  BP:  124/70  Pulse:  83  Temp:  98.3 F (36.8 C)  SpO2:  96%  Weight:  197 lb 6.4 oz (89.5 kg)  Height:  5\' 9"  (1.753 m)  PainSc: 5  5   PainLoc:  Shoulder   Body mass index is 29.15 kg/m.  Advanced Directives 09/09/2020 04/09/2020 11/26/2016 11/16/2016 10/31/2015  Does Patient Have a Medical Advance Directive? Yes No Yes Yes No  Type of Advance Directive Living will - [place];Living will -  Does patient want to make changes to medical advance directive? No - Patient declined - - - -  Copy of [place] in Chart? - - No - copy requested No - copy requested -  Would patient like information on creating a medical advance directive? - No - Patient declined - - No - patient declined information    Current Medications (verified) Outpatient Encounter Medications as of 09/09/2020  Medication Sig   allopurinol (ZYLOPRIM) 100 MG tablet Take 1 tablet by mouth once daily   b complex vitamins tablet Take 1 tablet by mouth daily.   B Complex-Biotin-FA (SUPER QUINTS B-50) TABS Take by mouth.   Cholecalciferol (VITAMIN D3) 1000 UNITS CAPS Take 1 Int'l Units by mouth daily.   losartan (COZAAR) 25 MG tablet Take 1 tablet by mouth twice daily   simvastatin (ZOCOR) 20 MG tablet TAKE 1 TABLET BY MOUTH AT BEDTIME   XARELTO 20 MG TABS tablet TAKE 1 TABLET BY MOUTH ONCE DAILY WITH SUPPER   traMADol (ULTRAM) 50 MG tablet Take 1 tablet (50 mg total) by mouth every 8 (eight) hours as needed for severe pain. (Patient not taking: Reported on 03/31/2020)   No facility-administered encounter medications on file as of 09/09/2020.    Allergies  (verified) Pravastatin   History: Past Medical History:  Diagnosis Date   Anxiety    Atherosclerosis of abdominal aorta (HCC) 11/12/2011   BPH (benign prostatic hyperplasia)    Bronchiectasis ([place]) 03/26/2018   Cancer of prostate ([place])    Chronic anticoagulation 09/12/2016   Coronary artery calcification seen on CT scan 03/26/2018   Diverticulosis of colon    DJD (degenerative joint disease)    left foot, "pt denies"   Glucose intolerance (impaired glucose tolerance) 11/03/2010   History of colon polyps    History of DVT (deep vein thrombosis)    HTN (hypertension)    Hypercholesteremia    Lumbar degenerative disc disease 11/12/2011   Plantar fasciitis    Sleep apnea    Past Surgical History:  Procedure Laterality Date   INSERTION PROSTATE RADIATION SEED     UVULECTOMY     Family History  Problem Relation Age of Onset   Deep vein thrombosis Mother    Diabetes Mother    Healthy Brother    Healthy Brother    Colon cancer Neg Hx    Rectal cancer Neg Hx    Social History   Socioeconomic History   Marital status: Married    Spouse name: Not on file   Number of children: 2   Years of education: Not on file   Highest education level: Not on  file  Occupational History   Occupation: retired  Tobacco Use   Smoking status: Former    Packs/day: 0.25    Years: 40.00    Pack years: 10.00    Types: Cigarettes    Start date: 02/26/1969    Quit date: 11/23/2017    Years since quitting: 2.7   Smokeless tobacco: Never  Vaping Use   Vaping Use: Never used  Substance and Sexual Activity   Alcohol use: No   Drug use: No   Sexual activity: Not Currently  Other Topics Concern   Not on file  Social History Narrative   Not on file   Social Determinants of Health   Financial Resource Strain: Low Risk    Difficulty of Paying Living Expenses: Not hard at all  Food Insecurity: No Food Insecurity   Worried About Charity fundraiser in the Last Year: Never true   [place] in  the Last Year: Never true  Transportation Needs: No Transportation Needs   Lack of Transportation (Medical): No   Lack of Transportation (Non-Medical): No  Physical Activity: Sufficiently Active   Days of Exercise per Week: 5 days   Minutes of Exercise per Session: 30 min  Stress: No Stress Concern Present   Feeling of Stress : Not at all  Social Connections: Moderately Integrated   Frequency of Communication with Friends and Family: Once a week   Frequency of Social Gatherings with Friends and Family: Once a week   Attends Religious Services: More than 4 times per year   Active Member of [employer] or Organizations: Yes   Attends Music therapist: More than 4 times per year   Marital Status: Married    Tobacco Counseling Counseling given: Not Answered   Clinical Intake:  Pre-visit preparation completed: Yes  Pain : No/denies pain Pain Score: 5      BMI - recorded: 29.15 Nutritional Status: BMI 25 -29 Overweight Nutritional Risks: None Diabetes: No  How often do you need to have someone help you when you read instructions, pamphlets, or other written materials from your doctor or pharmacy?: 1 - Never What is the last grade level you completed in school?: Bachelor's Degree  Diabetic? no  Interpreter Needed?: No  Information entered by :: Lisette Abu, LPN   Activities of Daily Living In your present state of health, do you have any difficulty performing the following activities: 09/09/2020  Hearing? Y  Vision? N  Difficulty concentrating or making decisions? N  Walking or climbing stairs? N  Dressing or bathing? N  Doing errands, shopping? N  Preparing Food and eating ? N  Using the Toilet? N  In the past six months, have you accidently leaked urine? N  Do you have problems with loss of bowel control? N  Managing your Medications? N  Managing your Finances? N  Housekeeping or managing your Housekeeping? N  Some recent data might be hidden     Patient Care Team: Biagio Borg, MD as PCP - General Fay Records, MD as PCP - Cardiology (Cardiology) Irene Shipper, MD as Consulting Physician (Gastroenterology) Paulla Dolly Tamala Fothergill, DPM as Consulting Physician (Podiatry) Lyndal Pulley, DO as Consulting Physician (Family Medicine) [place], [place] Eduardo Osier., MD as Attending Physician (Urology) Troy Sine, MD as Consulting Physician (Cardiology) Alanda Slim Neena Rhymes, MD as Consulting Physician (Ophthalmology)  Indicate any recent Medical Services you may have received from other than [facility] providers in the past year (date may be  approximate).     Assessment:   This is a routine wellness examination for [place].  Hearing/Vision screen Hearing Screening - Comments:: Patient having decreased hearing; has hearing aids Vision Screening - Comments:: Patient wears glasses. Eyes checked once a year.  Dietary issues and exercise activities discussed: Current Exercise Habits: Home exercise routine, Type of exercise: treadmill;walking, Time (Minutes): 30, Frequency (Times/Week): 4, Weekly Exercise (Minutes/Week): 120, Intensity: Moderate, Exercise limited by: None identified   Goals Addressed               This Visit's Progress     Patient Stated (pt-stated)        My goal is to keep my weight around 195 pounds.  Continue to active and independent as much as possible.      Depression Screen PHQ 2/9 Scores 09/09/2020 09/30/2019 03/31/2019 09/24/2018 09/24/2018 03/26/2018 09/19/2017  PHQ - 2 Score 0 0 0 0 0 0 0  PHQ- 9 Score - 0 - - 0 - 0    Fall Risk Fall Risk  09/09/2020 09/30/2019 03/31/2019 09/24/2018 03/26/2018  Falls in the past year? 0 0 0 0 0  Number falls in past yr: 0 0 - - -  Injury with Fall? 0 0 - - -  Risk for fall due to : No Fall Risks No Fall Risks - - -  Follow up Falls evaluation completed Falls evaluation completed - - -    FALL RISK PREVENTION PERTAINING TO THE HOME:  Any stairs in or around the home? Yes  If  so, are there any without handrails? No  Home free of loose throw rugs in walkways, pet beds, electrical cords, etc? Yes  Adequate lighting in your home to reduce risk of falls? Yes   ASSISTIVE DEVICES UTILIZED TO PREVENT FALLS:  Life alert? No  Use of a cane, walker or w/c? No  Grab bars in the bathroom? Yes  Shower chair or bench in shower? Yes  Elevated toilet seat or a handicapped toilet? Yes   TIMED UP AND GO:  Was the test performed? Yes .  Length of time to ambulate 10 feet: 6 sec.   Gait steady and fast without use of assistive device  Cognitive Function: Normal cognitive status assessed by direct observation by this Nurse Health Advisor. No abnormalities found.   MMSE - Mini Mental State Exam 11/16/2016  Orientation to time 5  Orientation to Place 5  Registration 3  Attention/ Calculation 4  Recall 0  Language- name 2 objects 2  Language- repeat 1  Language- follow 3 step command 3  Language- read & follow direction 1  Write a sentence 1  Copy design 1  Total score 26        Immunizations Immunization History  Administered Date(s) Administered   Fluad Quad(high Dose 65+) 10/30/2018   Influenza Whole 11/28/2009, 11/06/2010, 11/10/2011   Influenza, High Dose Seasonal PF 11/27/2014, 12/31/2014, 10/17/2016, 10/26/2017   Influenza,inj,Quad PF,6+ Mos 11/11/2012, 12/22/2015, 12/18/2019   Influenza-Unspecified 12/04/2002, 11/27/2003, 12/28/2003, 10/23/2010, 12/28/2011, 10/27/2013, 11/30/2014, 10/28/2018   Moderna Sars-Covid-2 Vaccination 04/11/2019, 05/09/2019, 01/04/2020, 06/27/2020   Pneumococcal Conjugate-13 11/11/2012, 06/15/2014   Pneumococcal Polysaccharide-23 02/28/2004, 11/13/2012   Pneumococcal-Unspecified 01/05/2004, 12/28/2010   Td 02/27/2000   Tdap 06/15/2014   Tetanus 05/09/2012   Zoster Recombinat (Shingrix) 01/29/2018, 05/07/2018   Zoster, Live 05/19/2013    TDAP status: Up to date  Flu Vaccine status: Up to date  Pneumococcal vaccine  status: Up to date  Covid-19 vaccine status: Completed vaccines  Qualifies for Shingles Vaccine? Yes   Zostavax completed Yes   Shingrix Completed?: Yes  Screening Tests Health Maintenance  Topic Date Due   INFLUENZA VACCINE  09/26/2020   COVID-19 Vaccine (5 - Booster for Moderna series) 10/28/2020   TETANUS/TDAP  06/14/2024   PNA vac Low Risk Adult  Completed   Zoster Vaccines- Shingrix  Completed   HPV VACCINES  Aged Out    Health Maintenance  There are no preventive care reminders to display for this patient.   Colorectal cancer screening: No longer required.   Lung Cancer Screening: (Low Dose CT Chest recommended if Age 50-80 years, 30 pack-year currently smoking OR have quit w/in 15years.) does not qualify.   Lung Cancer Screening Referral: no  Additional Screening:  Hepatitis C Screening: does not qualify; Completed no  Vision Screening: Recommended annual ophthalmology exams for early detection of glaucoma and other disorders of the eye. Is the patient up to date with their annual eye exam?  Yes  Who is the provider or what is the name of the office in which the patient attends annual eye exams? Julian Reil, MD. If pt is not established with a provider, would they like to be referred to a provider to establish care? No .   Dental Screening: Recommended annual dental exams for proper oral hygiene  Community Resource Referral / Chronic Care Management: CRR required this visit?  No   CCM required this visit?  No      Plan:     I have personally reviewed and noted the following in the patient's chart:   Medical and social history Use of alcohol, tobacco or illicit drugs  Current medications and supplements including opioid prescriptions. Patient is not currently taking opioid prescriptions. Functional ability and status Nutritional status Physical activity Advanced directives List of other physicians Hospitalizations, surgeries, and ER visits in  previous 12 months Vitals Screenings to include cognitive, depression, and falls Referrals and appointments  In addition, I have reviewed and discussed with patient certain preventive protocols, quality metrics, and best practice recommendations. A written personalized care plan for preventive services as well as general preventive health recommendations were provided to patient.     Sheral Flow, LPN   1/[id number]/4401   Nurse Notes: n/a

## 2020-09-09 NOTE — Patient Instructions (Signed)
Mr. Kevin Hale , Thank you for taking time to come for your Medicare Wellness Visit. I appreciate your ongoing commitment to your health goals. Please review the following plan we discussed and let me know if I can assist you in the future.   Screening recommendations/referrals: Colonoscopy: last done 02/18/2020; Repeat colonoscopy is not recommended for surveillance. Recommended yearly ophthalmology/optometry visit for glaucoma screening and checkup Recommended yearly dental visit for hygiene and checkup  Vaccinations: Influenza vaccine: 12/18/2019 Pneumococcal vaccine: 11/13/2012, 06/15/2014 Tdap vaccine: 06/15/2014; due every 10 years Shingles vaccine: 01/29/2018, 05/07/2018   Covid-19: 04/11/2019, 05/09/2019, 01/04/2020, 06/27/2020  Advanced directives: Please bring a copy of your health care power of attorney and living will to the office at your convenience.  Conditions/risks identified: Client understands the importance of follow-up with providers by attending scheduled visits and discussed goals to eat healthier, increase physical activity, exercise the brain, socialize more,  get enough rest and make time for laughter.  Next appointment: Please schedule your next Medicare Wellness Visit with your Nurse Health Advisor in 1 year by calling [phone].  Preventive Care 22 Years and Older, Male Preventive care refers to lifestyle choices and visits with your health care provider that can promote health and wellness. What does preventive care include? A yearly physical exam. This is also called an annual well check. Dental exams once or twice a year. Routine eye exams. Ask your health care provider how often you should have your eyes checked. Personal lifestyle choices, including: Daily care of your teeth and gums. Regular physical activity. Eating a healthy diet. Avoiding tobacco and drug use. Limiting alcohol use. Practicing safe sex. Taking low doses of aspirin every day. Taking vitamin  and mineral supplements as recommended by your health care provider. What happens during an annual well check? The services and screenings done by your health care provider during your annual well check will depend on your age, overall health, lifestyle risk factors, and family history of disease. Counseling  Your health care provider may ask you questions about your: Alcohol use. Tobacco use. Drug use. Emotional well-being. Home and relationship well-being. Sexual activity. Eating habits. History of falls. Memory and ability to understand (cognition). Work and work Statistician. Screening  You may have the following tests or measurements: Height, weight, and BMI. Blood pressure. Lipid and cholesterol levels. These may be checked every 5 years, or more frequently if you are over 28 years old. Skin check. Lung cancer screening. You may have this screening every year starting at age 62 if you have a 30-pack-year history of smoking and currently smoke or have quit within the past 15 years. Fecal occult blood test (FOBT) of the stool. You may have this test every year starting at age 83. Flexible sigmoidoscopy or colonoscopy. You may have a sigmoidoscopy every 5 years or a colonoscopy every 10 years starting at age 28. Prostate cancer screening. Recommendations will vary depending on your family history and other risks. Hepatitis C blood test. Hepatitis B blood test. Sexually transmitted disease (STD) testing. Diabetes screening. This is done by checking your blood sugar (glucose) after you have not eaten for a while (fasting). You may have this done every 1-3 years. Abdominal aortic aneurysm (AAA) screening. You may need this if you are a current or former smoker. Osteoporosis. You may be screened starting at age 71 if you are at high risk. Talk with your health care provider about your test results, treatment options, and if necessary, the need for more tests. Vaccines  Your health care  provider may recommend certain vaccines, such as: Influenza vaccine. This is recommended every year. Tetanus, diphtheria, and acellular pertussis (Tdap, Td) vaccine. You may need a Td booster every 10 years. Zoster vaccine. You may need this after age 25. Pneumococcal 13-valent conjugate (PCV13) vaccine. One dose is recommended after age 29. Pneumococcal polysaccharide (PPSV23) vaccine. One dose is recommended after age 2. Talk to your health care provider about which screenings and vaccines you need and how often you need them. This information is not intended to replace advice given to you by your health care provider. Make sure you discuss any questions you have with your health care provider. Document Released: 03/11/2015 Document Revised: 11/02/2015 Document Reviewed: 12/14/2014 Elsevier Interactive Patient Education  2017 [place] Prevention in the Home Falls can cause injuries. They can happen to people of all ages. There are many things you can do to make your home safe and to help prevent falls. What can I do on the outside of my home? Regularly fix the edges of walkways and driveways and fix any cracks. Remove anything that might make you trip as you walk through a door, such as a raised step or threshold. Trim any bushes or trees on the path to your home. Use bright outdoor lighting. Clear any walking paths of anything that might make someone trip, such as rocks or tools. Regularly check to see if handrails are loose or broken. Make sure that both sides of any steps have handrails. Any raised decks and porches should have guardrails on the edges. Have any leaves, snow, or ice cleared regularly. Use sand or salt on walking paths during winter. Clean up any spills in your garage right away. This includes oil or grease spills. What can I do in the bathroom? Use night lights. Install grab bars by the toilet and in the tub and shower. Do not use towel bars as grab  bars. Use non-skid mats or decals in the tub or shower. If you need to sit down in the shower, use a plastic, non-slip stool. Keep the floor dry. Clean up any water that spills on the floor as soon as it happens. Remove soap buildup in the tub or shower regularly. Attach bath mats securely with double-sided non-slip rug tape. Do not have throw rugs and other things on the floor that can make you trip. What can I do in the bedroom? Use night lights. Make sure that you have a light by your bed that is easy to reach. Do not use any sheets or blankets that are too big for your bed. They should not hang down onto the floor. Have a firm chair that has side arms. You can use this for support while you get dressed. Do not have throw rugs and other things on the floor that can make you trip. What can I do in the kitchen? Clean up any spills right away. Avoid walking on wet floors. Keep items that you use a lot in easy-to-reach places. If you need to reach something above you, use a strong step stool that has a grab bar. Keep electrical cords out of the way. Do not use floor polish or wax that makes floors slippery. If you must use wax, use non-skid floor wax. Do not have throw rugs and other things on the floor that can make you trip. What can I do with my stairs? Do not leave any items on the stairs. Make sure that there are  handrails on both sides of the stairs and use them. Fix handrails that are broken or loose. Make sure that handrails are as long as the stairways. Check any carpeting to make sure that it is firmly attached to the stairs. Fix any carpet that is loose or worn. Avoid having throw rugs at the top or bottom of the stairs. If you do have throw rugs, attach them to the floor with carpet tape. Make sure that you have a light switch at the top of the stairs and the bottom of the stairs. If you do not have them, ask someone to add them for you. What else can I do to help prevent  falls? Wear shoes that: Do not have high heels. Have rubber bottoms. Are comfortable and fit you well. Are closed at the toe. Do not wear sandals. If you use a stepladder: Make sure that it is fully opened. Do not climb a closed stepladder. Make sure that both sides of the stepladder are locked into place. Ask someone to hold it for you, if possible. Clearly mark and make sure that you can see: Any grab bars or handrails. First and last steps. Where the edge of each step is. Use tools that help you move around (mobility aids) if they are needed. These include: Canes. Walkers. Scooters. Crutches. Turn on the lights when you go into a dark area. Replace any light bulbs as soon as they burn out. Set up your furniture so you have a clear path. Avoid moving your furniture around. If any of your floors are uneven, fix them. If there are any pets around you, be aware of where they are. Review your medicines with your doctor. Some medicines can make you feel dizzy. This can increase your chance of falling. Ask your doctor what other things that you can do to help prevent falls. This information is not intended to replace advice given to you by your health care provider. Make sure you discuss any questions you have with your health care provider. Document Released: 12/09/2008 Document Revised: 07/21/2015 Document Reviewed: 03/19/2014 Elsevier Interactive Patient Education  2017 [employer].

## 2020-09-19 ENCOUNTER — Telehealth: Payer: Self-pay | Admitting: Internal Medicine

## 2020-09-19 ENCOUNTER — Telehealth: Payer: Self-pay

## 2020-09-19 NOTE — Telephone Encounter (Signed)
So I am not sure what he needs since sleep studies usually refers to sleep apnea, but I dont see that diagnosis on his list   Please clarify with pt - I can refer to pulmonary for sleep apnea if needed

## 2020-09-19 NOTE — Telephone Encounter (Signed)
pt has stated he would like to speak with [facility] in regards to af/u with her sleep study. Pt  an be contacted at [phone].  **Sent to [place] to advise.

## 2020-09-19 NOTE — Telephone Encounter (Signed)
-----   Message from Sheral Flow, [place] sent at [id number]  4:34 PM EDT ----- Regarding: Sleep supplies Patient is calling regarding his sleep study supplies.  Message was previously sent over 2 weeks a go and patient has not heard anything.  Can someone please contact patient or route this to the appropriate department.  Lisette Abu, LPN

## 2020-09-19 NOTE — Telephone Encounter (Signed)
Message was sent to PCP and Cardiology.

## 2020-09-20 NOTE — Telephone Encounter (Signed)
Called patient for clarification. Patient wanted information on his sleep study supplies. I notified the patient that our office does not handle the sleep study supplies he explained Dr. Claiborne Billings does. Patient notified to reach out to that office for further questions.

## 2020-09-28 ENCOUNTER — Encounter: Payer: Self-pay | Admitting: Internal Medicine

## 2020-09-28 ENCOUNTER — Other Ambulatory Visit: Payer: Self-pay

## 2020-09-28 ENCOUNTER — Ambulatory Visit (INDEPENDENT_AMBULATORY_CARE_PROVIDER_SITE_OTHER): Payer: Medicare Other | Admitting: Internal Medicine

## 2020-09-28 VITALS — BP 138/78 | HR 92 | Temp 98.9°F | Ht 69.0 in | Wt 195.8 lb

## 2020-09-28 DIAGNOSIS — E538 Deficiency of other specified B group vitamins: Secondary | ICD-10-CM

## 2020-09-28 DIAGNOSIS — E7800 Pure hypercholesterolemia, unspecified: Secondary | ICD-10-CM

## 2020-09-28 DIAGNOSIS — R7302 Impaired glucose tolerance (oral): Secondary | ICD-10-CM | POA: Diagnosis not present

## 2020-09-28 DIAGNOSIS — M542 Cervicalgia: Secondary | ICD-10-CM | POA: Diagnosis not present

## 2020-09-28 DIAGNOSIS — C61 Malignant neoplasm of prostate: Secondary | ICD-10-CM

## 2020-09-28 DIAGNOSIS — E559 Vitamin D deficiency, unspecified: Secondary | ICD-10-CM

## 2020-09-28 DIAGNOSIS — N3281 Overactive bladder: Secondary | ICD-10-CM | POA: Insufficient documentation

## 2020-09-28 DIAGNOSIS — I10 Essential (primary) hypertension: Secondary | ICD-10-CM

## 2020-09-28 LAB — CBC WITH DIFFERENTIAL/PLATELET
Basophils Absolute: 0 10*3/uL (ref 0.0–0.1)
Basophils Relative: 0.5 % (ref 0.0–3.0)
Eosinophils Absolute: 0.2 10*3/uL (ref 0.0–0.7)
Eosinophils Relative: 2.8 % (ref 0.0–5.0)
HCT: 47 % (ref 39.0–52.0)
Hemoglobin: 15.2 g/dL (ref 13.0–17.0)
Lymphocytes Relative: 44.6 % (ref 12.0–46.0)
Lymphs Abs: 2.6 10*3/uL (ref 0.7–4.0)
MCHC: 32.3 g/dL (ref 30.0–36.0)
MCV: 88 fl (ref 78.0–100.0)
Monocytes Absolute: 0.5 10*3/uL (ref 0.1–1.0)
Monocytes Relative: 8.8 % (ref 3.0–12.0)
Neutro Abs: 2.5 10*3/uL (ref 1.4–7.7)
Neutrophils Relative %: 43.3 % (ref 43.0–77.0)
Platelets: 232 10*3/uL (ref 150.0–400.0)
RBC: 5.34 Mil/uL (ref 4.22–5.81)
RDW: 13.7 % (ref 11.5–15.5)
WBC: 5.9 10*3/uL (ref 4.0–10.5)

## 2020-09-28 LAB — HEMOGLOBIN A1C: Hgb A1c MFr Bld: 5.6 % (ref 4.6–6.5)

## 2020-09-28 LAB — VITAMIN B12: Vitamin B-12: 673 pg/mL (ref 211–911)

## 2020-09-28 LAB — BASIC METABOLIC PANEL
BUN: 11 mg/dL (ref 6–23)
CO2: 26 mEq/L (ref 19–32)
Calcium: 9.7 mg/dL (ref 8.4–10.5)
Chloride: 105 mEq/L (ref 96–112)
Creatinine, Ser: 1.05 mg/dL (ref 0.40–1.50)
GFR: 66.88 mL/min (ref >60.00)
Glucose, Bld: 95 mg/dL (ref 70–99)
Potassium: 4.2 mEq/L (ref 3.5–5.1)
Sodium: 139 mEq/L (ref 135–145)

## 2020-09-28 LAB — LIPID PANEL
Cholesterol: 102 mg/dL (ref 0–200)
HDL: 51.3 mg/dL (ref >39.00)
LDL Cholesterol: 42 mg/dL (ref 0–99)
NonHDL: 50.89
Total CHOL/HDL Ratio: 2
Triglycerides: 44 mg/dL (ref 0.0–149.0)
VLDL: 8.8 mg/dL (ref 0.0–40.0)

## 2020-09-28 LAB — PSA: PSA: 0 ng/mL — ABNORMAL LOW (ref 0.10–4.00)

## 2020-09-28 LAB — HEPATIC FUNCTION PANEL
ALT: 21 U/L (ref 0–53)
AST: 25 U/L (ref 0–37)
Albumin: 4.2 g/dL (ref 3.5–5.2)
Alkaline Phosphatase: 79 U/L (ref 39–117)
Bilirubin, Direct: 0.2 mg/dL (ref 0.0–0.3)
Total Bilirubin: 0.6 mg/dL (ref 0.2–1.2)
Total Protein: 7.1 g/dL (ref 6.0–8.3)

## 2020-09-28 LAB — TSH: TSH: 0.77 u[IU]/mL (ref 0.35–5.50)

## 2020-09-28 LAB — VITAMIN D 25 HYDROXY (VIT D DEFICIENCY, FRACTURES): VITD: 58.88 ng/mL (ref 30.00–100.00)

## 2020-09-28 MED ORDER — SOLIFENACIN SUCCINATE 5 MG PO TABS: 5.0000 mg | ORAL_TABLET | Freq: Every day | ORAL | 3 refills | Status: DC

## 2020-09-28 NOTE — Patient Instructions (Signed)
Please consider Sports Medicine on the first floor if your neck pain gets worse  Please take all new medication as prescribed - the vesicare for overactive bladder  Remember the Novavax covid vaccine will likely be out by October 2022  Please continue all other medications as before, and refills have been done if requested.  Please have the pharmacy call with any other refills you may need.  Please continue your efforts at being more active, low cholesterol diet, and weight control  Please keep your appointments with your specialists as you may have planned  Please go to the LAB at the blood drawing area for the tests to be done  You will be contacted by phone if any changes need to be made immediately.  Otherwise, you will receive a letter about your results with an explanation, but please check with MyChart first.  Please remember to sign up for MyChart if you have not done so, as this will be important to you in the future with finding out test results, communicating by private email, and scheduling acute appointments online when needed.  Please make an Appointment to return in 6 months, or sooner if needed

## 2020-09-28 NOTE — Progress Notes (Signed)
Patient ID: Kevin Hale, male   DOB: 1940/05/07, 80 y.o.   MRN: [id number]        Chief Complaint: follow up HTN, HLD and hyperglycemia , left neck pain, OAB symptoms       HPI:  Kevin Hale is a 80 y.o. male here with c/o left neck pain at the base but only feels the pain with trying to sit up straight and extend the neck, overall mild, intermittent , and without extremity pain, weakness or numbness.  Denies urinary symptoms such as dysuria, urgency, flank pain, hematuria or n/v, fever, chills, but has significant frequency multiple times per day for many months.  Pt denies chest pain, increased sob or doe, wheezing, orthopnea, PND, increased LE swelling, palpitations, dizziness or syncope.   Pt denies polydipsia, polyuria, or new focal neuro s/s.   Pt denies fever, wt loss, night sweats, loss of appetite, or other constitutional symptoms        Wt Readings from Last 3 Encounters:  09/28/20 195 lb 12.8 oz (88.8 kg)  09/09/20 197 lb 6.4 oz (89.5 kg)  04/09/20 195 lb (88.5 kg)   BP Readings from Last 3 Encounters:  09/28/20 138/78  09/09/20 124/70  03/31/20 122/70         Past Medical History:  Diagnosis Date   Anxiety    Atherosclerosis of abdominal aorta (HCC) 11/12/2011   BPH (benign prostatic hyperplasia)    Bronchiectasis ([place]) 03/26/2018   Cancer of prostate ([place])    Chronic anticoagulation 09/12/2016   Coronary artery calcification seen on CT scan 03/26/2018   Diverticulosis of colon    DJD (degenerative joint disease)    left foot, "pt denies"   Glucose intolerance (impaired glucose tolerance) 11/03/2010   History of colon polyps    History of DVT (deep vein thrombosis)    HTN (hypertension)    Hypercholesteremia    Lumbar degenerative disc disease 11/12/2011   Plantar fasciitis    Sleep apnea    Past Surgical History:  Procedure Laterality Date   INSERTION PROSTATE RADIATION SEED     UVULECTOMY      reports that he quit smoking about 2 years ago. His smoking use  included cigarettes. He started smoking about 51 years ago. He has a 10.00 pack-year smoking history. He has never used smokeless tobacco. He reports that he does not drink alcohol and does not use drugs. family history includes Deep vein thrombosis in his mother; Diabetes in his mother; Healthy in his brother and brother. Allergies  Allergen Reactions   Pravastatin Other (See Comments)    myalgia   Current Outpatient Medications on File Prior to Visit  Medication Sig Dispense Refill   allopurinol (ZYLOPRIM) 100 MG tablet Take 1 tablet by mouth once daily 90 tablet 0   b complex vitamins tablet Take 1 tablet by mouth daily.     B Complex-Biotin-FA (SUPER QUINTS B-50) TABS Take by mouth.     Cholecalciferol (VITAMIN D3) 1000 UNITS CAPS Take 1 Int'l Units by mouth daily.     losartan (COZAAR) 25 MG tablet Take 1 tablet by mouth twice daily 180 tablet 3   losartan (COZAAR) 50 MG tablet Take 0.5 tablets by mouth daily.     simvastatin (ZOCOR) 20 MG tablet TAKE 1 TABLET BY MOUTH AT BEDTIME 90 tablet 0   XARELTO 20 MG TABS tablet TAKE 1 TABLET BY MOUTH ONCE DAILY WITH SUPPER 90 tablet 0   traMADol (ULTRAM) 50 MG tablet Take 1  tablet (50 mg total) by mouth every 8 (eight) hours as needed for severe pain. (Patient not taking: No sig reported) 15 tablet 0   No current facility-administered medications on file prior to visit.        ROS:  All others reviewed and negative.  Objective        PE:  BP 138/78 (BP Location: Left Arm, Patient Position: Sitting, Cuff Size: Normal)   Pulse 92   Temp 98.9 F (37.2 C) (Oral)   Ht '5\' 9"'$  (1.753 m)   Wt 195 lb 12.8 oz (88.8 kg)   SpO2 97%   BMI 28.91 kg/m                 Constitutional: Pt appears in NAD               HENT: Head: NCAT.                Right Ear: External ear normal.                 Left Ear: External ear normal.                Eyes: . Pupils are equal, round, and reactive to light. Conjunctivae and EOM are normal               Nose:  without d/c or deformity, and FROM, nontender, no rash               Neck: Neck supple. Gross normal ROM               Cardiovascular: Normal rate and regular rhythm.                 Pulmonary/Chest: Effort normal and breath sounds without rales or wheezing.                Abd:  Soft, NT, ND, + BS, no organomegaly               Neurological: Pt is alert. At baseline orientation, motor grossly intact               Skin: Skin is warm. 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   Bilateral multilevel bony foraminal narrowing.   No acute bony findings.     Lab Results  Component Value Date   WBC 5.9 09/28/2020   HGB 15.2 09/28/2020   HCT 47.0 09/28/2020   PLT 232.0 09/28/2020   GLUCOSE 95 09/28/2020   CHOL 102 09/28/2020   TRIG 44.0 09/28/2020   HDL 51.30 09/28/2020   LDLCALC 42 09/28/2020   ALT 21 09/28/2020   AST 25 09/28/2020   NA 139 09/28/2020   K 4.2 09/28/2020   CL 105 09/28/2020   CREATININE 1.05 09/28/2020   BUN 11 09/28/2020   CO2 26 09/28/2020   TSH 0.77 09/28/2020   PSA 0.00 (L) 09/28/2020   INR 2.1 05/28/2017   HGBA1C 5.6 09/28/2020   Assessment/Plan:  Kevin Hale is a 80 y.o. Black or African American [2] male with  has a past medical history of Anxiety, Atherosclerosis  of abdominal aorta ([place]) (11/12/2011), BPH (benign prostatic hyperplasia), Bronchiectasis ([place]) (03/26/2018), Cancer of prostate [facility] ([place])), Chronic anticoagulation (09/12/2016), Coronary artery calcification seen on CT scan (03/26/2018), Diverticulosis of colon, DJD (degenerative joint disease), Glucose intolerance (impaired glucose tolerance) (11/03/2010), History of colon polyps,  History of DVT (deep vein thrombosis), HTN (hypertension), Hypercholesteremia, Lumbar degenerative disc disease (11/12/2011), Plantar fasciitis, and Sleep apnea.  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, ok for tylenol prn, consider sport medicine for any worsening,  to f/u any worsening symptoms or concerns  Impaired glucose tolerance Lab Results  Component Value Date   HGBA1C 5.6 09/28/2020   Stable, pt to continue current medical treatment  - diet   Hypercholesteremia Lab Results  Component Value Date   LDLCALC 42 09/28/2020   Stable, pt to continue current statin zocor 20 qd   HTN (hypertension) BP Readings from Last 3 Encounters:  09/28/20 138/78  09/09/20 124/70  03/31/20 122/70   Stable, pt to continue medical treatment losartan 50  Followup: Return in about 6 months (around 03/31/2021).  Cathlean Cower, MD 10/01/2020 7:23 PM [facility]

## 2020-09-29 LAB — URINALYSIS, ROUTINE W REFLEX MICROSCOPIC
Bilirubin Urine: NEGATIVE
Hgb urine dipstick: NEGATIVE
Ketones, ur: NEGATIVE
Leukocytes,Ua: NEGATIVE
Nitrite: NEGATIVE
RBC / HPF: NONE SEEN (ref 0–?)
Specific Gravity, Urine: 1.01 (ref 1.000–1.030)
Total Protein, Urine: NEGATIVE
Urine Glucose: NEGATIVE
Urobilinogen, UA: 0.2 (ref 0.0–1.0)
WBC, UA: NONE SEEN (ref 0–?)
pH: 7 (ref 5.0–8.0)

## 2020-10-01 ENCOUNTER — Encounter: Payer: Self-pay | Admitting: Internal Medicine

## 2020-10-01 NOTE — Assessment & Plan Note (Signed)
For UA with labs, but also vesicare 5 qd,  to f/u any worsening symptoms or concerns

## 2020-10-01 NOTE — Assessment & Plan Note (Signed)
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

## 2020-10-01 NOTE — Assessment & Plan Note (Signed)
Lab Results  Component Value Date   LDLCALC 42 09/28/2020   Stable, pt to continue current statin zocor 20 qd

## 2020-10-01 NOTE — Assessment & Plan Note (Signed)
BP Readings from Last 3 Encounters:  09/28/20 138/78  09/09/20 124/70  03/31/20 122/70   Stable, pt to continue medical treatment losartan 50

## 2020-10-01 NOTE — Assessment & Plan Note (Signed)
Lab Results  Component Value Date   HGBA1C 5.6 09/28/2020   Stable, pt to continue current medical treatment  - diet

## 2020-10-01 NOTE — Assessment & Plan Note (Signed)
Also for f/u psa with labs,  to f/u any worsening symptoms or concerns

## 2020-11-08 ENCOUNTER — Telehealth: Payer: Self-pay | Admitting: *Deleted

## 2020-11-08 NOTE — Telephone Encounter (Signed)
Received a call from sharon with [facility] to inform me that she called the patient to go over the finances and get him set up for CPAP. She informed him his copay will be $8.59 per month for 10 months, and the patient declined. He states that he does not need anymore bills. Ivin Booty states that she emphasized to the patient the medical issues that he could face with untreated OSA. Patient stated he understands, but doesn't want anymore bills. Message will be sent to Dr Claiborne Billings for review.

## 2020-11-22 ENCOUNTER — Telehealth: Payer: Self-pay | Admitting: Internal Medicine

## 2020-11-22 NOTE — Telephone Encounter (Signed)
Pt has called wondering if Dr.John can fill out a handicap placard form for him due to the pain in the right leg when walking.  Please advise.  Pt best contact number: [phone]

## 2020-11-24 ENCOUNTER — Other Ambulatory Visit (HOSPITAL_BASED_OUTPATIENT_CLINIC_OR_DEPARTMENT_OTHER): Payer: Self-pay

## 2020-11-24 ENCOUNTER — Ambulatory Visit: Payer: Medicare Other | Attending: Internal Medicine

## 2020-11-24 DIAGNOSIS — Z23 Encounter for immunization: Secondary | ICD-10-CM

## 2020-11-24 MED ORDER — MODERNA COVID-19 BIVAL BOOSTER 50 MCG/0.5ML IM SUSP
INTRAMUSCULAR | 0 refills | Status: DC
  Filled 2020-11-24: qty 0.5, 1d supply, fill #0

## 2020-11-24 NOTE — Progress Notes (Signed)
   Covid-19 Vaccination Clinic  Name:  Kevin Hale    MRN: [id number] DOB: March 04, 1940  11/24/2020  Mr. Kevin Hale was observed post Covid-19 immunization for 15 minutes without incident. He was provided with Vaccine Information Sheet and instruction to access the V-Safe system.   Mr. Kevin Hale was instructed to call 911 with any severe reactions post vaccine: Difficulty breathing  Swelling of face and throat  A fast heartbeat  A bad rash all over body  Dizziness and weakness

## 2020-11-24 NOTE — Telephone Encounter (Signed)
Ok done hardcopy to [employer] station

## 2020-11-24 NOTE — Telephone Encounter (Signed)
Patient notified and will pick up papers this week.

## 2020-11-29 NOTE — Telephone Encounter (Signed)
I would emphasize to the patient at $8.95 per month equates to $0.29 a day.  I believe a good night sleep is worth $0.29!

## 2020-12-01 NOTE — Telephone Encounter (Signed)
Spoke with the patient and he states that he has decided to get his machine and CPAP therapy provided through the [place]. It won't cost him any $$. Patient states  that he came by and got copies of his sleep studies to provide to the [place].

## 2020-12-05 NOTE — Telephone Encounter (Signed)
ok 

## 2020-12-08 ENCOUNTER — Other Ambulatory Visit: Payer: Self-pay | Admitting: Internal Medicine

## 2020-12-08 NOTE — Telephone Encounter (Signed)
Please refill as per office routine med refill policy (all routine meds to be refilled for 3 mo or monthly (per pt preference) up to one year from last visit, then month to month grace period for 3 mo, then further med refills will have to be denied) ? ?

## 2020-12-12 ENCOUNTER — Other Ambulatory Visit: Payer: Self-pay

## 2020-12-12 MED ORDER — LOSARTAN POTASSIUM 25 MG PO TABS: 25.0000 mg | ORAL_TABLET | Freq: Two times a day (BID) | ORAL | 0 refills | Status: DC

## 2020-12-14 NOTE — Progress Notes (Addendum)
Cardiology Office Note   Date:  12/14/2020   ID:  ROBERTA KELLY, DOB 01-11-1941, MRN [id number]  PCP:  Biagio Borg, MD  Cardiologist:   Dorris Carnes, MD   Pt presents for f/u of DVT?PE    History of Present Illness: Kevin Hale is a 80 y.o. 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    [facility] CP   No dizzienss   Wife does say he snores a lot.   He had sleep study in past    Severe sleep apnea.  Has not gotten CPAP yet  Waiting to hear from [facility] Medications Prior to Visit  Medication Sig Dispense Refill   allopurinol (ZYLOPRIM) 100 MG tablet Take 1 tablet by mouth once daily 90 tablet 0   b complex vitamins tablet Take 1 tablet by mouth daily.     B Complex-Biotin-FA (SUPER QUINTS B-50) TABS Take by mouth.     Cholecalciferol (VITAMIN D3) 1000 UNITS CAPS Take 1 Int'l Units by mouth daily.     COVID-19 mRNA bivalent vaccine, Moderna, (MODERNA COVID-19 BIVAL BOOSTER) 50 MCG/0.5ML injection Inject into the muscle. 0.5 mL 0   losartan (COZAAR) 25 MG tablet Take 1 tablet (25 mg total) by mouth 2 (two) times daily. Please keep upcoming appt with Dr Harrington Challenger for future refills Thank you 180 tablet 0   losartan (COZAAR) 50 MG tablet Take 0.5 tablets by mouth daily.     simvastatin (ZOCOR) 20 MG tablet TAKE 1 TABLET BY MOUTH AT BEDTIME 90 tablet 1   solifenacin (VESICARE) 5 MG tablet Take 1 tablet (5 mg total) by mouth daily. 90 tablet 3   traMADol (ULTRAM) 50 MG tablet Take 1 tablet (50 mg total) by mouth every 8 (eight) hours as needed for severe pain. (Patient not taking: No sig reported) 15 tablet 0   XARELTO 20 MG TABS tablet TAKE 1 TABLET BY MOUTH ONCE DAILY WITH SUPPER 90 tablet 0   No facility-administered medications prior to visit.     Allergies:   Pravastatin   Past Medical History:  Diagnosis  Date   Anxiety    Atherosclerosis of abdominal aorta (HCC) 11/12/2011   BPH (benign prostatic hyperplasia)    Bronchiectasis ([place]) 03/26/2018   Cancer of prostate ([place])    Chronic anticoagulation 09/12/2016   Coronary artery calcification seen on CT scan 03/26/2018   Diverticulosis of colon    DJD (degenerative joint disease)    left foot, "pt denies"   Glucose intolerance (impaired glucose tolerance) 11/03/2010   History of colon polyps    History of DVT (deep vein thrombosis)    HTN (hypertension)    Hypercholesteremia    Lumbar degenerative disc disease 11/12/2011   Plantar fasciitis    Sleep apnea     Past Surgical History:  Procedure Laterality Date   INSERTION PROSTATE RADIATION SEED     UVULECTOMY       Social History:  The patient  reports that he quit smoking about 3 years ago. His smoking use included cigarettes. He started smoking about 51 years ago. He has a 10.00 pack-year smoking history. He has never used smokeless tobacco. He reports that he does not drink alcohol and  does not use drugs.   Family History:  The patient's family history includes Deep vein thrombosis in his mother; Diabetes in his mother; Healthy in his brother and brother.    ROS:  Please see the history of present illness. All other systems are reviewed and  Negative to the above problem except as noted.    PHYSICAL EXAM: VS:  There were no vitals taken for this visit.  GEN: Overweight 80 yo  in no acute distress  HEENT: normal  Neck: no JVD, carotid bruits Cardiac: RRR; no murmurs, rubs, or gallops,  No LE  edema  Respiratory:   Relatively clear   GI: soft, nontender, nondistended, + BS  No hepatomegaly  MS: no deformity Moving all extremities   Skin: warm and dry, no rash Neuro:  Strength and sensation are intact Psych: euthymic mood, full affect   EKG:  EKG is ordered today.   SR 72 bpm  LVH   LAFB     Lipid Panel    Component Value Date/Time   CHOL 102 09/28/2020 1037   TRIG 44.0  09/28/2020 1037   HDL 51.30 09/28/2020 1037   CHOLHDL 2 09/28/2020 1037   VLDL 8.8 09/28/2020 1037   LDLCALC 42 09/28/2020 1037   LDLCALC 45 09/30/2019 1205      Wt Readings from Last 3 Encounters:  09/28/20 195 lb 12.8 oz (88.8 kg)  09/09/20 197 lb 6.4 oz (89.5 kg)  04/09/20 195 lb (88.5 kg)      ASSESSMENT AND PLAN: 1/  Hx of DVT/PE   Keep on Xarelto   Periodic CBC      3  HTN  BP is fair Continue meds and follow  4  PVOD  Atherosclerosis of aorta  Good pulses distally    5  HL   Conitnue  statin   Get lipids    5  ? Sleep apnea   Needs to get CPAP from [place]     F/U in 12 months     Stay active     Current medicines are reviewed at length with the patient today.  The patient does not have concerns regarding medicines.  Signed, Dorris Carnes, MD  12/14/2020 [facility] [place], [place], [place]  [address] Phone: [phone]; Fax: [phone]

## 2020-12-15 ENCOUNTER — Encounter: Payer: Self-pay | Admitting: Internal Medicine

## 2020-12-15 ENCOUNTER — Ambulatory Visit: Payer: Medicare Other | Admitting: Internal Medicine

## 2020-12-15 ENCOUNTER — Other Ambulatory Visit: Payer: Self-pay

## 2020-12-15 VITALS — BP 138/72 | HR 72 | Ht 69.0 in | Wt 200.2 lb

## 2020-12-15 DIAGNOSIS — I700 Atherosclerosis of aorta: Secondary | ICD-10-CM | POA: Diagnosis not present

## 2020-12-15 NOTE — Patient Instructions (Signed)
Medication Instructions:  Your physician recommends that you continue on your current medications as directed. Please refer to the Current Medication list given to you today.  *If you need a refill on your cardiac medications before your next appointment, please call your pharmacy*   Lab Work: none If you have labs (blood work) drawn today and your tests are completely normal, you will receive your results only by: MyChart Message (if you have MyChart) OR A paper copy in the mail If you have any lab test that is abnormal or we need to change your treatment, we will call you to review the results.   Testing/Procedures: none   Follow-Up: At [facility], you and your health needs are our priority.  As part of our continuing mission to provide you with exceptional heart care, we have created designated Provider Care Teams.  These Care Teams include your primary Cardiologist (physician) and Advanced Practice Providers (APPs -  Physician Assistants and Nurse Practitioners) who all work together to provide you with the care you need, when you need it.  We recommend signing up for the patient portal called "MyChart".  Sign up information is provided on this After Visit Summary.  MyChart is used to connect with patients for Virtual Visits (Telemedicine).  Patients are able to view lab/test results, encounter notes, upcoming appointments, etc.  Non-urgent messages can be sent to your provider as well.   To learn more about what you can do with MyChart, go to [email or web address].    Your next appointment:   1 year(s)  The format for your next appointment:   In Person  Provider:   Paula Ross, MD     Other Instructions   

## 2021-03-01 ENCOUNTER — Other Ambulatory Visit: Payer: Self-pay | Admitting: Internal Medicine

## 2021-03-02 ENCOUNTER — Ambulatory Visit: Payer: Medicare Other | Admitting: Internal Medicine

## 2021-04-04 ENCOUNTER — Ambulatory Visit: Payer: Medicare Other | Admitting: Internal Medicine

## 2021-04-05 ENCOUNTER — Encounter: Payer: Medicare Other | Admitting: Internal Medicine

## 2021-04-05 DIAGNOSIS — H40013 Open angle with borderline findings, low risk, bilateral: Secondary | ICD-10-CM | POA: Diagnosis not present

## 2021-04-05 DIAGNOSIS — H524 Presbyopia: Secondary | ICD-10-CM | POA: Diagnosis not present

## 2021-04-06 ENCOUNTER — Ambulatory Visit (INDEPENDENT_AMBULATORY_CARE_PROVIDER_SITE_OTHER): Payer: Medicare Other | Admitting: Internal Medicine

## 2021-04-06 ENCOUNTER — Encounter: Payer: Self-pay | Admitting: Internal Medicine

## 2021-04-06 ENCOUNTER — Other Ambulatory Visit: Payer: Self-pay

## 2021-04-06 VITALS — BP 142/72 | HR 78 | Temp 98.0°F | Ht 69.0 in | Wt 203.5 lb

## 2021-04-06 DIAGNOSIS — Z720 Tobacco use: Secondary | ICD-10-CM | POA: Diagnosis not present

## 2021-04-06 DIAGNOSIS — N3281 Overactive bladder: Secondary | ICD-10-CM | POA: Diagnosis not present

## 2021-04-06 DIAGNOSIS — E7800 Pure hypercholesterolemia, unspecified: Secondary | ICD-10-CM

## 2021-04-06 DIAGNOSIS — E559 Vitamin D deficiency, unspecified: Secondary | ICD-10-CM | POA: Diagnosis not present

## 2021-04-06 DIAGNOSIS — Z0001 Encounter for general adult medical examination with abnormal findings: Secondary | ICD-10-CM | POA: Diagnosis not present

## 2021-04-06 DIAGNOSIS — E538 Deficiency of other specified B group vitamins: Secondary | ICD-10-CM | POA: Diagnosis not present

## 2021-04-06 DIAGNOSIS — R252 Cramp and spasm: Secondary | ICD-10-CM | POA: Diagnosis not present

## 2021-04-06 DIAGNOSIS — I10 Essential (primary) hypertension: Secondary | ICD-10-CM | POA: Diagnosis not present

## 2021-04-06 DIAGNOSIS — I259 Chronic ischemic heart disease, unspecified: Secondary | ICD-10-CM | POA: Insufficient documentation

## 2021-04-06 DIAGNOSIS — C61 Malignant neoplasm of prostate: Secondary | ICD-10-CM | POA: Diagnosis not present

## 2021-04-06 DIAGNOSIS — G4733 Obstructive sleep apnea (adult) (pediatric): Secondary | ICD-10-CM | POA: Insufficient documentation

## 2021-04-06 DIAGNOSIS — H9190 Unspecified hearing loss, unspecified ear: Secondary | ICD-10-CM | POA: Insufficient documentation

## 2021-04-06 DIAGNOSIS — I700 Atherosclerosis of aorta: Secondary | ICD-10-CM | POA: Diagnosis not present

## 2021-04-06 DIAGNOSIS — R7302 Impaired glucose tolerance (oral): Secondary | ICD-10-CM

## 2021-04-06 LAB — BASIC METABOLIC PANEL
BUN: 12 mg/dL (ref 6–23)
CO2: 32 mEq/L (ref 19–32)
Calcium: 9.5 mg/dL (ref 8.4–10.5)
Chloride: 104 mEq/L (ref 96–112)
Creatinine, Ser: 1.21 mg/dL (ref 0.40–1.50)
GFR: 56.2 mL/min — ABNORMAL LOW (ref >60.00)
Glucose, Bld: 170 mg/dL — ABNORMAL HIGH (ref 70–99)
Potassium: 4.2 mEq/L (ref 3.5–5.1)
Sodium: 140 mEq/L (ref 135–145)

## 2021-04-06 LAB — CBC WITH DIFFERENTIAL/PLATELET
Basophils Absolute: 0 10*3/uL (ref 0.0–0.1)
Basophils Relative: 0.7 % (ref 0.0–3.0)
Eosinophils Absolute: 0.2 10*3/uL (ref 0.0–0.7)
Eosinophils Relative: 2.5 % (ref 0.0–5.0)
HCT: 46.7 % (ref 39.0–52.0)
Hemoglobin: 15 g/dL (ref 13.0–17.0)
Lymphocytes Relative: 43.5 % (ref 12.0–46.0)
Lymphs Abs: 2.6 10*3/uL (ref 0.7–4.0)
MCHC: 32 g/dL (ref 30.0–36.0)
MCV: 87.9 fl (ref 78.0–100.0)
Monocytes Absolute: 0.4 10*3/uL (ref 0.1–1.0)
Monocytes Relative: 7.5 % (ref 3.0–12.0)
Neutro Abs: 2.7 10*3/uL (ref 1.4–7.7)
Neutrophils Relative %: 45.8 % (ref 43.0–77.0)
Platelets: 228 10*3/uL (ref 150.0–400.0)
RBC: 5.31 Mil/uL (ref 4.22–5.81)
RDW: 13.1 % (ref 11.5–15.5)
WBC: 6 10*3/uL (ref 4.0–10.5)

## 2021-04-06 LAB — URINALYSIS, ROUTINE W REFLEX MICROSCOPIC
Bilirubin Urine: NEGATIVE
Hgb urine dipstick: NEGATIVE
Ketones, ur: NEGATIVE
Leukocytes,Ua: NEGATIVE
Nitrite: NEGATIVE
RBC / HPF: NONE SEEN (ref 0–?)
Specific Gravity, Urine: <=1.005 — AB (ref 1.000–1.030)
Total Protein, Urine: NEGATIVE
Urine Glucose: 100 — AB
Urobilinogen, UA: 0.2 (ref 0.0–1.0)
WBC, UA: NONE SEEN (ref 0–?)
pH: 6.5 (ref 5.0–8.0)

## 2021-04-06 LAB — HEPATIC FUNCTION PANEL
ALT: 14 U/L (ref 0–53)
AST: 16 U/L (ref 0–37)
Albumin: 3.8 g/dL (ref 3.5–5.2)
Alkaline Phosphatase: 74 U/L (ref 39–117)
Bilirubin, Direct: 0.1 mg/dL (ref 0.0–0.3)
Total Bilirubin: 0.6 mg/dL (ref 0.2–1.2)
Total Protein: 6.8 g/dL (ref 6.0–8.3)

## 2021-04-06 LAB — LIPID PANEL
Cholesterol: 108 mg/dL (ref 0–200)
HDL: 49.2 mg/dL (ref >39.00)
LDL Cholesterol: 48 mg/dL (ref 0–99)
NonHDL: 59.19
Total CHOL/HDL Ratio: 2
Triglycerides: 57 mg/dL (ref 0.0–149.0)
VLDL: 11.4 mg/dL (ref 0.0–40.0)

## 2021-04-06 LAB — HEMOGLOBIN A1C: Hgb A1c MFr Bld: 5.6 % (ref 4.6–6.5)

## 2021-04-06 LAB — VITAMIN B12: Vitamin B-12: 540 pg/mL (ref 211–911)

## 2021-04-06 LAB — TSH: TSH: 1.28 u[IU]/mL (ref 0.35–5.50)

## 2021-04-06 LAB — VITAMIN D 25 HYDROXY (VIT D DEFICIENCY, FRACTURES): VITD: 48.58 ng/mL (ref 30.00–100.00)

## 2021-04-06 LAB — PSA: PSA: 0 ng/mL — ABNORMAL LOW (ref 0.10–4.00)

## 2021-04-06 MED ORDER — SOLIFENACIN SUCCINATE 5 MG PO TABS: 5.0000 mg | ORAL_TABLET | Freq: Every day | ORAL | 3 refills | Status: DC

## 2021-04-06 NOTE — Assessment & Plan Note (Signed)

## 2021-04-06 NOTE — Assessment & Plan Note (Signed)
Pt to continue zocor, low chol diet, excercise

## 2021-04-06 NOTE — Patient Instructions (Addendum)
Please continue all other medications as before, and refills have been done if requested - the generic for vesicare  Please have the pharmacy call with any other refills you may need.  Please continue your efforts at being more active, low cholesterol diet, and weight control.  You are otherwise up to date with prevention measures today.  Please keep your appointments with your specialists as you may have planned  Please go to the LAB at the blood drawing area for the tests to be done  You will be contacted by phone if any changes need to be made immediately.  Otherwise, you will receive a letter about your results with an explanation, but please check with MyChart first.  Please remember to sign up for MyChart if you have not done so, as this will be important to you in the future with finding out test results, communicating by private email, and scheduling acute appointments online when needed.  Please make an Appointment to return in 6 months, or sooner if needed

## 2021-04-06 NOTE — Assessment & Plan Note (Signed)
With increased symptoms after ran out of med - for restart vesicare

## 2021-04-06 NOTE — Assessment & Plan Note (Signed)
Right sided, cant r/o PAD  - for arterial study r/o PAD

## 2021-04-06 NOTE — Assessment & Plan Note (Signed)
Lab Results  Component Value Date   LDLCALC 42 09/28/2020   Stable, pt to continue current statin zocor 20

## 2021-04-06 NOTE — Assessment & Plan Note (Signed)
S/p XRt seeds, also for f/u psa

## 2021-04-06 NOTE — Assessment & Plan Note (Signed)
Lab Results  Component Value Date   HGBA1C 5.6 09/28/2020   Stable, pt to continue current medical treatment  - diet

## 2021-04-06 NOTE — Assessment & Plan Note (Signed)
Remains quit x 3 yrs, pt encouraged to continue abstinence

## 2021-04-06 NOTE — Assessment & Plan Note (Signed)
BP Readings from Last 3 Encounters:  04/06/21 (!) 142/72  12/15/20 138/72  09/28/20 138/78   Stable at home per pt, pt to continue medical treatment losartan 25 bid as declines change

## 2021-04-06 NOTE — Progress Notes (Signed)
Patient ID: Kevin Hale, male   DOB: Sep 14, 1940, 81 y.o.   MRN: [id number]         Chief Complaint:: wellness exam and Annual Exam and Leg Pain (Rt leg pain  x 3 weeks, feels like a cramp ) , elevated BP and worsening urinary frequency        HPI:  Kevin Hale is a 81 y.o. male here for wellness exam;  already up to date                        Also c/o 2 wks onset worsening right calf crampy pain mild intermittent, possible worse with walking but also achy at time without walking, good compliance with xarelto; has chronic bilater swelling no change and calf not sore to touch.  Nothing else seems to make better or worse.  Pt denies chest pain, increased sob or doe, wheezing, orthopnea, PND, increased LE swelling, palpitations, dizziness or syncope.   Pt denies polydipsia, polyuria, or new focal neuro s/s.  Ran out vesicare a month ago, now with increased urinary frequency but Denies urinary symptoms such as dysuria, urgency, flank pain, hematuria or n/v, fever, chills.  BP has been < 140/90 at home, just not checked recently, but willing to check more.     Wt Readings from Last 3 Encounters:  04/06/21 203 lb 8 oz (92.3 kg)  12/15/20 200 lb 3.2 oz (90.8 kg)  09/28/20 195 lb 12.8 oz (88.8 kg)   BP Readings from Last 3 Encounters:  04/06/21 (!) 142/72  12/15/20 138/72  09/28/20 138/78   Immunization History  Administered Date(s) Administered   Fluad Quad(high Dose 65+) 10/30/2018, 11/15/2020   Influenza Whole 11/28/2009, 11/06/2010, 11/10/2011   Influenza, High Dose Seasonal PF 11/27/2014, 12/31/2014, 10/17/2016, 10/26/2017   Influenza,inj,Quad PF,6+ Mos 11/11/2012, 12/22/2015, 12/18/2019   Influenza-Unspecified 12/04/2002, 11/27/2003, 12/28/2003, 10/23/2010, 12/28/2011, 10/27/2013, 11/30/2014, 10/28/2018   Moderna Covid-19 Vaccine Bivalent Booster 3yrs & up 11/24/2020   Moderna Sars-Covid-2 Vaccination 04/11/2019, 05/09/2019, 01/04/2020, 06/27/2020   Pneumococcal Conjugate-13  11/11/2012, 06/15/2014   Pneumococcal Polysaccharide-23 02/28/2004, 11/13/2012   Pneumococcal-Unspecified 01/05/2004, 12/28/2010   Td 02/27/2000   Tdap 06/15/2014   Tetanus 05/09/2012   Zoster Recombinat (Shingrix) 01/29/2018, 05/07/2018   Zoster, Live 05/19/2013  There are no preventive care reminders to display for this patient.    Past Medical History:  Diagnosis Date   Anxiety    Atherosclerosis of abdominal aorta (HCC) 11/12/2011   BPH (benign prostatic hyperplasia)    Bronchiectasis ([place]) 03/26/2018   Cancer of prostate ([place])    Chronic anticoagulation 09/12/2016   Coronary artery calcification seen on CT scan 03/26/2018   Diverticulosis of colon    DJD (degenerative joint disease)    left foot, "pt denies"   Glucose intolerance (impaired glucose tolerance) 11/03/2010   History of colon polyps    History of DVT (deep vein thrombosis)    HTN (hypertension)    Hypercholesteremia    Lumbar degenerative disc disease 11/12/2011   Plantar fasciitis    Sleep apnea    Past Surgical History:  Procedure Laterality Date   INSERTION PROSTATE RADIATION SEED     UVULECTOMY      reports that he quit smoking about 3 years ago. His smoking use included cigarettes. He started smoking about 52 years ago. He has a 10.00 pack-year smoking history. He has never used smokeless tobacco. He reports that he does not drink alcohol and does not use drugs. family  history includes Deep vein thrombosis in his mother; Diabetes in his mother; Healthy in his brother and brother. Allergies  Allergen Reactions   Pravastatin Other (See Comments)    myalgia   Current Outpatient Medications on File Prior to Visit  Medication Sig Dispense Refill   allopurinol (ZYLOPRIM) 100 MG tablet Take 1 tablet by mouth once daily 90 tablet 0   b complex vitamins tablet Take 1 tablet by mouth daily.     B Complex-Biotin-FA (SUPER QUINTS B-50) TABS Take by mouth.     Cholecalciferol (VITAMIN D3) 1000 UNITS CAPS Take 1  Int'l Units by mouth daily.     COVID-19 mRNA bivalent vaccine, Moderna, (MODERNA COVID-19 BIVAL BOOSTER) 50 MCG/0.5ML injection Inject into the muscle. 0.5 mL 0   losartan (COZAAR) 25 MG tablet TAKE 1 TABLET BY MOUTH TWICE DAILY . APPOINTMENT REQUIRED FOR FUTURE REFILLS 180 tablet 2   simvastatin (ZOCOR) 20 MG tablet TAKE 1 TABLET BY MOUTH AT BEDTIME 90 tablet 1   traMADol (ULTRAM) 50 MG tablet Take 1 tablet (50 mg total) by mouth every 8 (eight) hours as needed for severe pain. 15 tablet 0   XARELTO 20 MG TABS tablet TAKE 1 TABLET BY MOUTH ONCE DAILY WITH SUPPER 90 tablet 0   No current facility-administered medications on file prior to visit.        ROS:  All others reviewed and negative.  Objective        PE:  BP (!) 142/72    Pulse 78    Temp 98 F (36.7 C)    Ht 5\' 9"  (1.753 m)    Wt 203 lb 8 oz (92.3 kg)    SpO2 97%    BMI 30.05 kg/m                 Constitutional: Pt appears in NAD               HENT: Head: NCAT.                Right Ear: External ear normal.                 Left Ear: External ear normal.                Eyes: . Pupils are equal, round, and reactive to light. Conjunctivae and EOM are normal               Nose: without d/c or deformity               Neck: Neck supple. Gross normal ROM               Cardiovascular: Normal rate and regular rhythm.                 Pulmonary/Chest: Effort normal and breath sounds without rales or wheezing.                Abd:  Soft, NT, ND, + BS, no organomegaly               Neurological: Pt is alert. At baseline orientation, motor grossly intact               Skin: Skin is warm. No rashes, no other new lesions, LE edema - trace to 1+ chronic > left; dorsalis pedis pulse 1+ bilateral               Psychiatric: Pt behavior is normal without agitation  Micro: none  Cardiac tracings I have personally interpreted today:  none  Pertinent Radiological findings (summarize): none   Lab Results  Component Value Date   WBC 5.9  09/28/2020   HGB 15.2 09/28/2020   HCT 47.0 09/28/2020   PLT 232.0 09/28/2020   GLUCOSE 95 09/28/2020   CHOL 102 09/28/2020   TRIG 44.0 09/28/2020   HDL 51.30 09/28/2020   LDLCALC 42 09/28/2020   ALT 21 09/28/2020   AST 25 09/28/2020   NA 139 09/28/2020   K 4.2 09/28/2020   CL 105 09/28/2020   CREATININE 1.05 09/28/2020   BUN 11 09/28/2020   CO2 26 09/28/2020   TSH 0.77 09/28/2020   PSA 0.00 (L) 09/28/2020   INR 2.1 05/28/2017   HGBA1C 5.6 09/28/2020   Assessment/Plan:  Kevin Hale is a 81 y.o. Black or African American [2] male with  has a past medical history of Anxiety, Atherosclerosis of abdominal aorta ([place]) (11/12/2011), BPH (benign prostatic hyperplasia), Bronchiectasis ([place]) (03/26/2018), Cancer of prostate ([place]), Chronic anticoagulation (09/12/2016), Coronary artery calcification seen on CT scan (03/26/2018), Diverticulosis of colon, DJD (degenerative joint disease), Glucose intolerance (impaired glucose tolerance) (11/03/2010), History of colon polyps, History of DVT (deep vein thrombosis), HTN (hypertension), Hypercholesteremia, Lumbar degenerative disc disease (11/12/2011), Plantar fasciitis, and Sleep apnea.  Tobacco abuse Remains quit x 3 yrs, pt encouraged to continue abstinence  Aortic atherosclerosis (HCC) Pt to continue zocor, low chol diet, excercise  Carcinoma of prostate ([place]) S/p XRt seeds, also for f/u psa  Encounter for well adult exam with abnormal findings Age and sex appropriate education and counseling updated with regular exercise and diet Referrals for preventative services - none needed Immunizations addressed - none needed Smoking counseling  - none needed Evidence for depression or other mood disorder - none significant Most recent labs reviewed. I have personally reviewed and have noted: 1) the patient's medical and social history 2) The patient's current medications and supplements 3) The patient's height, weight, and BMI have been recorded in  the chart   HTN (hypertension) BP Readings from Last 3 Encounters:  04/06/21 (!) 142/72  12/15/20 138/72  09/28/20 138/78   Stable at home per pt, pt to continue medical treatment losartan 25 bid as declines change   Hypercholesteremia Lab Results  Component Value Date   LDLCALC 42 09/28/2020   Stable, pt to continue current statin zocor 20   Impaired glucose tolerance Lab Results  Component Value Date   HGBA1C 5.6 09/28/2020   Stable, pt to continue current medical treatment  - diet   OAB (overactive bladder) With increased symptoms after ran out of med - for restart vesicare  LEG CRAMPS Right sided, cant r/o PAD  - for arterial study r/o PAD  Followup: Return in about 6 months (around 10/04/2021).  Cathlean Cower, MD 04/06/2021 10:00 AM [facility]

## 2021-04-28 ENCOUNTER — Encounter: Payer: Self-pay | Admitting: Internal Medicine

## 2021-04-28 ENCOUNTER — Telehealth: Payer: Self-pay | Admitting: Internal Medicine

## 2021-04-28 ENCOUNTER — Ambulatory Visit (HOSPITAL_COMMUNITY)
Admission: RE | Admit: 2021-04-28 | Discharge: 2021-04-28 | Disposition: A | Discharge status: Home / Self Care | Payer: Medicare Other | Source: Ambulatory Visit | Attending: Cardiology | Admitting: Cardiology

## 2021-04-28 ENCOUNTER — Other Ambulatory Visit: Payer: Self-pay

## 2021-04-28 ENCOUNTER — Other Ambulatory Visit: Payer: Self-pay | Admitting: Internal Medicine

## 2021-04-28 DIAGNOSIS — R252 Cramp and spasm: Secondary | ICD-10-CM | POA: Insufficient documentation

## 2021-04-28 DIAGNOSIS — E7800 Pure hypercholesterolemia, unspecified: Secondary | ICD-10-CM | POA: Diagnosis not present

## 2021-04-28 DIAGNOSIS — Z720 Tobacco use: Secondary | ICD-10-CM | POA: Diagnosis not present

## 2021-04-28 DIAGNOSIS — I739 Peripheral vascular disease, unspecified: Secondary | ICD-10-CM | POA: Insufficient documentation

## 2021-04-28 NOTE — Telephone Encounter (Signed)
I spoke with the pt and he is asking for a LE "doppler"... it looks like Dr. Jenny Reichmann has ordered already and he is having them at our [place] office on 05/04/21... I advised him that I will look for there results and will be sure that Dr. Harrington Challenger has a chance to also see the results.  ? ?Pt agrees and I will; call him after she has a chance to review.  ?

## 2021-04-28 NOTE — Telephone Encounter (Signed)
Patients calling in because he would like to have a doplar as well. Please advise ?

## 2021-05-04 ENCOUNTER — Ambulatory Visit (HOSPITAL_COMMUNITY)
Admission: RE | Admit: 2021-05-04 | Discharge: 2021-05-04 | Disposition: A | Discharge status: Home / Self Care | Payer: Medicare Other | Source: Ambulatory Visit | Attending: Internal Medicine | Admitting: Internal Medicine

## 2021-05-04 ENCOUNTER — Other Ambulatory Visit: Payer: Self-pay

## 2021-05-04 DIAGNOSIS — I739 Peripheral vascular disease, unspecified: Secondary | ICD-10-CM | POA: Diagnosis not present

## 2021-06-06 ENCOUNTER — Other Ambulatory Visit: Payer: Self-pay | Admitting: Internal Medicine

## 2021-06-06 NOTE — Telephone Encounter (Signed)
Please refill as per office routine med refill policy (all routine meds to be refilled for 3 mo or monthly (per pt preference) up to one year from last visit, then month to month grace period for 3 mo, then further med refills will have to be denied) ? ?

## 2021-07-05 ENCOUNTER — Ambulatory Visit: Payer: Medicare Other | Admitting: Vascular Surgery

## 2021-07-05 ENCOUNTER — Encounter: Payer: Self-pay | Admitting: Vascular Surgery

## 2021-07-05 VITALS — BP 131/60 | HR 79 | Temp 98.5°F | Resp 20 | Ht 69.0 in | Wt 196.0 lb

## 2021-07-05 DIAGNOSIS — M25562 Pain in left knee: Secondary | ICD-10-CM | POA: Diagnosis not present

## 2021-07-05 DIAGNOSIS — M25561 Pain in right knee: Secondary | ICD-10-CM | POA: Diagnosis not present

## 2021-07-05 NOTE — Progress Notes (Signed)
? ?Patient ID: Kevin Hale, male   DOB: Aug 21, 1940, 81 y.o.   MRN: [id number] ? ?Reason for Consult: New Patient (Initial Visit) ?  ?Referred by Biagio Borg, MD ? ?Subjective:  ?   ?HPI: ? ?Kevin Hale is a 81 y.o. male presents for evaluation of right leg pain.  He cannot exactly tell me when this started.  He does have a history of 2 DVTs in the right leg has swelling for which he wears gentle compression.  He does not have any left leg pain.  He does not have any tissue loss or ulceration.  He is an everyday smoker for many years.  He is on Xarelto does not take aspirin for this reason.  He states that he is never had a stroke, TIA or amaurosis.  He has no personal or family history of aneurysm disease.  He is able to walk this does not really exacerbate his pain as his pain is present at rest and with walking.  He has not had a DVT in 8 years.  Pain starts in his right buttock radiates down the right leg into his popliteal fossa and calf.  He does not have any cramping associated. ? ?Past Medical History:  ?Diagnosis Date  ? Anxiety   ? Atherosclerosis of abdominal aorta ([place]) 11/12/2011  ? BPH (benign prostatic hyperplasia)   ? Bronchiectasis ([place]) 03/26/2018  ? Cancer of prostate [facility])   ? Chronic anticoagulation 09/12/2016  ? Coronary artery calcification seen on CT scan 03/26/2018  ? Diverticulosis of colon   ? DJD (degenerative joint disease)   ? left foot, "pt denies"  ? Glucose intolerance (impaired glucose tolerance) 11/03/2010  ? History of colon polyps   ? History of DVT (deep vein thrombosis)   ? HTN (hypertension)   ? Hypercholesteremia   ? Lumbar degenerative disc disease 11/12/2011  ? Plantar fasciitis   ? Sleep apnea   ? ?Family History  ?Problem Relation Age of Onset  ? Deep vein thrombosis Mother   ? Diabetes Mother   ? Healthy Brother   ? Healthy Brother   ? Colon cancer Neg Hx   ? Rectal cancer Neg Hx   ? ?Past Surgical History:  ?Procedure Laterality Date  ? INSERTION PROSTATE RADIATION SEED     ? UVULECTOMY    ? ? ?Short Social History:  ?Social History  ? ?Tobacco Use  ? Smoking status: Former  ?  Packs/day: 0.25  ?  Years: 40.00  ?  Pack years: 10.00  ?  Types: Cigarettes  ?  Start date: 02/26/1969  ?  Quit date: 11/23/2017  ?  Years since quitting: 3.6  ? Smokeless tobacco: Never  ?Substance Use Topics  ? Alcohol use: No  ? ? ?Allergies  ?Allergen Reactions  ? Pravastatin Other (See Comments)  ?  myalgia  ? ? ?Current Outpatient Medications  ?Medication Sig Dispense Refill  ? allopurinol (ZYLOPRIM) 100 MG tablet Take 1 tablet by mouth once daily 90 tablet 0  ? b complex vitamins tablet Take 1 tablet by mouth daily.    ? B Complex-Biotin-FA (SUPER QUINTS B-50) TABS Take by mouth.    ? Cholecalciferol (VITAMIN D3) 1000 UNITS CAPS Take 1 Int'l Units by mouth daily.    ? losartan (COZAAR) 25 MG tablet TAKE 1 TABLET BY MOUTH TWICE DAILY . APPOINTMENT REQUIRED FOR FUTURE REFILLS 180 tablet 2  ? simvastatin (ZOCOR) 20 MG tablet TAKE 1 TABLET BY MOUTH AT BEDTIME 90 tablet  3  ? solifenacin (VESICARE) 5 MG tablet Take 1 tablet (5 mg total) by mouth daily. 90 tablet 3  ? traMADol (ULTRAM) 50 MG tablet Take 1 tablet (50 mg total) by mouth every 8 (eight) hours as needed for severe pain. 15 tablet 0  ? XARELTO 20 MG TABS tablet TAKE 1 TABLET BY MOUTH ONCE DAILY WITH SUPPER 90 tablet 0  ? ?No current facility-administered medications for this visit.  ? ? ?Review of Systems  ?Constitutional:  Constitutional negative. ?HENT: HENT negative.  ?Eyes: Eyes negative.  ?Respiratory: Respiratory negative.  ?Cardiovascular: Cardiovascular negative.  ?GI: Gastrointestinal negative.  ?Musculoskeletal: Positive for leg pain.  ?Neurological: Neurological negative. ?Hematologic: Hematologic/lymphatic negative.  ?Psychiatric: Psychiatric negative.   ? ?   ?Objective:  ?Objective  ? ?Vitals:  ? 07/05/21 0912  ?BP: 131/60  ?Pulse: 79  ?Resp: 20  ?Temp: 98.5 ?F (36.9 ?C)  ?SpO2: 98%  ?Weight: 196 lb (88.9 kg)  ?Height: '5\' 9"'$  (1.753 m)   ? ?Body mass index is 28.94 kg/m?. ? ?Physical Exam ?HENT:  ?   Nose: Nose normal.  ?Eyes:  ?   Pupils: Pupils are equal, round, and reactive to light.  ?Neck:  ?   Vascular: No carotid bruit.  ?Cardiovascular:  ?   Rate and Rhythm: Normal rate.  ?   Pulses:     ?     Carotid pulses are 2+ on the right side and 2+ on the left side. ?     Femoral pulses are 2+ on the right side and 2+ on the left side. ?     Popliteal pulses are 2+ on the right side and 2+ on the left side.  ?     Dorsalis pedis pulses are 1+ on the right side and 2+ on the left side.  ?     Posterior tibial pulses are 1+ on the right side and 1+ on the left side.  ?Pulmonary:  ?   Effort: Pulmonary effort is normal.  ?Abdominal:  ?   General: Abdomen is flat.  ?   Palpations: Abdomen is soft. There is no mass.  ?Musculoskeletal:     ?   General: Normal range of motion.  ?   Right lower leg: Edema present.  ?   Left lower leg: No edema.  ?Skin: ?   General: Skin is warm and dry.  ?   Capillary Refill: Capillary refill takes less than 2 seconds.  ?Neurological:  ?   General: No focal deficit present.  ?   Mental Status: He is alert.  ?Psychiatric:     ?   Mood and Affect: Mood normal.  ? ? ?Data: ?ABI Findings:  ?+---------+------------------+-----+-----------+--------+  ?Right    Rt Pressure (mmHg)IndexWaveform   Comment   ?+---------+------------------+-----+-----------+--------+  ?Brachial 153                                         ?+---------+------------------+-----+-----------+--------+  ?PTA      162               1.06 biphasic             ?+---------+------------------+-----+-----------+--------+  ?PERO     154               1.01 multiphasic          ?+---------+------------------+-----+-----------+--------+  ?DP       155  1.01 triphasic            ?+---------+------------------+-----+-----------+--------+  ?Great Toe122               0.80 Normal                ?+---------+------------------+-----+-----------+--------+  ? ?+---------+------------------+-----+---------+-------+  ?Left     Lt Pressure (mmHg)IndexWaveform Comment  ?+---------+------------------+-----+---------+-------+  ?Brachial 153                                      ?+---------+------------------+-----+---------+-------+  ?PTA      137               0.90 triphasic         ?+---------+------------------+-----+---------+-------+  ?PERO     135               0.88 triphasic         ?+---------+------------------+-----+---------+-------+  ?DP       125               0.82 triphasic         ?+---------+------------------+-----+---------+-------+  ?Great MBW466               0.90 Normal            ?+---------+------------------+-----+---------+-------+  ? ?+-------+-----------+-----------+------------+------------+  ?ABI/TBIToday's ABIToday's TBIPrevious ABIPrevious TBI  ?+-------+-----------+-----------+------------+------------+  ?Right  1.06       .80        1.14        .93           ?+-------+-----------+-----------+------------+------------+  ?Left   .90        .90        1.02        .81           ?+-------+-----------+-----------+------------+------------+  ? ? RIGHT     PSV cm/sRatioStenosis       Waveform Comments  ?+----------+--------+-----+---------------+---------+--------+  ?CFA Prox  154                         biphasic           ?+----------+--------+-----+---------------+---------+--------+  ?DFA       99                          biphasic           ?+----------+--------+-----+---------------+---------+--------+  ?SFA Prox  124                         biphasic           ?+----------+--------+-----+---------------+---------+--------+  ?SFA Mid   156                         biphasic           ?+----------+--------+-----+---------------+---------+--------+  ?SFA Distal265          30-49% stenosisbiphasic            ?+----------+--------+-----+---------------+---------+--------+  ?POP Prox  105                         triphasic          ?+----------+--------+-----+---------------+---------+--------+  ?POP Distal74  biphasic           ?+----------+--------+-----+---------------+---------+--------+  ?TP Trunk  87                          triphasi

## 2021-07-13 ENCOUNTER — Telehealth: Payer: Self-pay | Admitting: Internal Medicine

## 2021-08-21 ENCOUNTER — Telehealth: Payer: Self-pay | Admitting: Internal Medicine

## 2021-08-21 MED ORDER — LOSARTAN POTASSIUM 25 MG PO TABS: ORAL_TABLET | ORAL | 1 refills | Status: DC

## 2021-08-21 NOTE — Telephone Encounter (Signed)
Pt's medication was sent to pt's pharmacy as requested. Confirmation received.  °

## 2021-09-15 ENCOUNTER — Ambulatory Visit (INDEPENDENT_AMBULATORY_CARE_PROVIDER_SITE_OTHER): Payer: Medicare Other

## 2021-09-15 DIAGNOSIS — Z0000 Encounter for general adult medical examination without abnormal findings: Secondary | ICD-10-CM

## 2021-09-15 NOTE — Patient Instructions (Signed)
Kevin Hale , Thank you for taking time to come for your Medicare Wellness Visit. I appreciate your ongoing commitment to your health goals. Please review the following plan we discussed and let me know if I can assist you in the future.   Screening recommendations/referrals: Colonoscopy: no longer required  Recommended yearly ophthalmology/optometry visit for glaucoma screening and checkup Recommended yearly dental visit for hygiene and checkup  Vaccinations: Influenza vaccine: completed  Pneumococcal vaccine: completed  Tdap vaccine: 06/15/2014 Shingles vaccine: completed     Advanced directives: yes   Conditions/risks identified: none   Next appointment: none   Preventive Care 82 Years and Older, Male Preventive care refers to lifestyle choices and visits with your health care provider that can promote health and wellness. What does preventive care include? A yearly physical exam. This is also called an annual well check. Dental exams once or twice a year. Routine eye exams. Ask your health care provider how often you should have your eyes checked. Personal lifestyle choices, including: Daily care of your teeth and gums. Regular physical activity. Eating a healthy diet. Avoiding tobacco and drug use. Limiting alcohol use. Practicing safe sex. Taking low doses of aspirin every day. Taking vitamin and mineral supplements as recommended by your health care provider. What happens during an annual well check? The services and screenings done by your health care provider during your annual well check will depend on your age, overall health, lifestyle risk factors, and family history of disease. Counseling  Your health care provider may ask you questions about your: Alcohol use. Tobacco use. Drug use. Emotional well-being. Home and relationship well-being. Sexual activity. Eating habits. History of falls. Memory and ability to understand (cognition). Work and work  Statistician. Screening  You may have the following tests or measurements: Height, weight, and BMI. Blood pressure. Lipid and cholesterol levels. These may be checked every 5 years, or more frequently if you are over 82 years old. Skin check. Lung cancer screening. You may have this screening every year starting at age 82 if you have a 30-pack-year history of smoking and currently smoke or have quit within the past 15 years. Fecal occult blood test (FOBT) of the stool. You may have this test every year starting at age 82. Flexible sigmoidoscopy or colonoscopy. You may have a sigmoidoscopy every 5 years or a colonoscopy every 10 years starting at age 82. Prostate cancer screening. Recommendations will vary depending on your family history and other risks. Hepatitis C blood test. Hepatitis B blood test. Sexually transmitted disease (STD) testing. Diabetes screening. This is done by checking your blood sugar (glucose) after you have not eaten for a while (fasting). You may have this done every 1-3 years. Abdominal aortic aneurysm (AAA) screening. You may need this if you are a current or former smoker. Osteoporosis. You may be screened starting at age 82 if you are at high risk. Talk with your health care provider about your test results, treatment options, and if necessary, the need for more tests. Vaccines  Your health care provider may recommend certain vaccines, such as: Influenza vaccine. This is recommended every year. Tetanus, diphtheria, and acellular pertussis (Tdap, Td) vaccine. You may need a Td booster every 10 years. Zoster vaccine. You may need this after age 82. Pneumococcal 13-valent conjugate (PCV13) vaccine. One dose is recommended after age 82. Pneumococcal polysaccharide (PPSV23) vaccine. One dose is recommended after age 82. Talk to your health care provider about which screenings and vaccines you need and how  often you need them. This information is not intended to replace  advice given to you by your health care provider. Make sure you discuss any questions you have with your health care provider. Document Released: 03/11/2015 Document Revised: 11/02/2015 Document Reviewed: 12/14/2014 Elsevier Interactive Patient Education  2017 [place] Prevention in the Home Falls can cause injuries. They can happen to people of all ages. There are many things you can do to make your home safe and to help prevent falls. What can I do on the outside of my home? Regularly fix the edges of walkways and driveways and fix any cracks. Remove anything that might make you trip as you walk through a door, such as a raised step or threshold. Trim any bushes or trees on the path to your home. Use bright outdoor lighting. Clear any walking paths of anything that might make someone trip, such as rocks or tools. Regularly check to see if handrails are loose or broken. Make sure that both sides of any steps have handrails. Any raised decks and porches should have guardrails on the edges. Have any leaves, snow, or ice cleared regularly. Use sand or salt on walking paths during winter. Clean up any spills in your garage right away. This includes oil or grease spills. What can I do in the bathroom? Use night lights. Install grab bars by the toilet and in the tub and shower. Do not use towel bars as grab bars. Use non-skid mats or decals in the tub or shower. If you need to sit down in the shower, use a plastic, non-slip stool. Keep the floor dry. Clean up any water that spills on the floor as soon as it happens. Remove soap buildup in the tub or shower regularly. Attach bath mats securely with double-sided non-slip rug tape. Do not have throw rugs and other things on the floor that can make you trip. What can I do in the bedroom? Use night lights. Make sure that you have a light by your bed that is easy to reach. Do not use any sheets or blankets that are too big for your bed.  They should not hang down onto the floor. Have a firm chair that has side arms. You can use this for support while you get dressed. Do not have throw rugs and other things on the floor that can make you trip. What can I do in the kitchen? Clean up any spills right away. Avoid walking on wet floors. Keep items that you use a lot in easy-to-reach places. If you need to reach something above you, use a strong step stool that has a grab bar. Keep electrical cords out of the way. Do not use floor polish or wax that makes floors slippery. If you must use wax, use non-skid floor wax. Do not have throw rugs and other things on the floor that can make you trip. What can I do with my stairs? Do not leave any items on the stairs. Make sure that there are handrails on both sides of the stairs and use them. Fix handrails that are broken or loose. Make sure that handrails are as long as the stairways. Check any carpeting to make sure that it is firmly attached to the stairs. Fix any carpet that is loose or worn. Avoid having throw rugs at the top or bottom of the stairs. If you do have throw rugs, attach them to the floor with carpet tape. Make sure that you have a  light switch at the top of the stairs and the bottom of the stairs. If you do not have them, ask someone to add them for you. What else can I do to help prevent falls? Wear shoes that: Do not have high heels. Have rubber bottoms. Are comfortable and fit you well. Are closed at the toe. Do not wear sandals. If you use a stepladder: Make sure that it is fully opened. Do not climb a closed stepladder. Make sure that both sides of the stepladder are locked into place. Ask someone to hold it for you, if possible. Clearly mark and make sure that you can see: Any grab bars or handrails. First and last steps. Where the edge of each step is. Use tools that help you move around (mobility aids) if they are needed. These  include: Canes. Walkers. Scooters. Crutches. Turn on the lights when you go into a dark area. Replace any light bulbs as soon as they burn out. Set up your furniture so you have a clear path. Avoid moving your furniture around. If any of your floors are uneven, fix them. If there are any pets around you, be aware of where they are. Review your medicines with your doctor. Some medicines can make you feel dizzy. This can increase your chance of falling. Ask your doctor what other things that you can do to help prevent falls. This information is not intended to replace advice given to you by your health care provider. Make sure you discuss any questions you have with your health care provider. Document Released: 12/09/2008 Document Revised: 07/21/2015 Document Reviewed: 03/19/2014 Elsevier Interactive Patient Education  2017 [employer].

## 2021-09-15 NOTE — Progress Notes (Signed)
Subjective:   DERRIN CURREY is a 81 y.o. male who presents for an subsequent Medicare Annual Wellness Visit.   I connected with Delanna Notice  today by telephone and verified that I am speaking with the correct person using two identifiers. Location patient: home Location provider: work Persons participating in the virtual visit: patient, provider.   I discussed the limitations, risks, security and privacy concerns of performing an evaluation and management service by telephone and the availability of in person appointments. I also discussed with the patient that there may be a patient responsible charge related to this service. The patient expressed understanding and verbally consented to this telephonic visit.    Interactive audio and video telecommunications were attempted between this provider and patient, however failed, due to patient having technical difficulties OR patient did not have access to video capability.  We continued and completed visit with audio only.    Review of Systems     Cardiac Risk Factors include: advanced age (>62mn, >>81women);male gender     Objective:    Today's Vitals   There is no height or weight on file to calculate BMI.     09/15/2021    9:11 AM 09/09/2020   10:45 AM 04/09/2020    9:05 PM 11/26/2016    1:39 PM 11/16/2016   10:33 AM 10/31/2015   12:08 PM  Advanced Directives  Does Patient Have a Medical Advance Directive? Yes Yes No Yes Yes No  Type of AParamedicof [place]am[place] will Living will  Healthcare Power of [place] will   Does patient want to make changes to medical advance directive?  No - Patient declined      Copy of [place] Chart? No - copy requested   No - copy requested No - copy requested   Would patient like information on creating a medical advance directive?   No - Patient declined   No - patient declined information    Current Medications  (verified) Outpatient Encounter Medications as of 09/15/2021  Medication Sig   allopurinol (ZYLOPRIM) 100 MG tablet Take 1 tablet by mouth once daily   b complex vitamins tablet Take 1 tablet by mouth daily.   B Complex-Biotin-FA (SUPER QUINTS B-50) TABS Take by mouth.   Cholecalciferol (VITAMIN D3) 1000 UNITS CAPS Take 1 Int'l Units by mouth daily.   losartan (COZAAR) 25 MG tablet TAKE 1 TABLET BY MOUTH TWICE DAILY   simvastatin (ZOCOR) 20 MG tablet TAKE 1 TABLET BY MOUTH AT BEDTIME   solifenacin (VESICARE) 5 MG tablet Take 1 tablet (5 mg total) by mouth daily.   XARELTO 20 MG TABS tablet TAKE 1 TABLET BY MOUTH ONCE DAILY WITH SUPPER   traMADol (ULTRAM) 50 MG tablet Take 1 tablet (50 mg total) by mouth every 8 (eight) hours as needed for severe pain. (Patient not taking: Reported on 09/15/2021)   No facility-administered encounter medications on file as of 09/15/2021.    Allergies (verified) Pravastatin   History: Past Medical History:  Diagnosis Date   Anxiety    Atherosclerosis of abdominal aorta (HCC) 11/12/2011   BPH (benign prostatic hyperplasia)    Bronchiectasis (HCoker 03/26/2018   Cancer of prostate ([place]    Chronic anticoagulation 09/12/2016   Coronary artery calcification seen on CT scan 03/26/2018   Diverticulosis of colon    DJD (degenerative joint disease)    left foot, "pt denies"   Glucose intolerance (impaired glucose tolerance)  11/03/2010   History of colon polyps    History of DVT (deep vein thrombosis)    HTN (hypertension)    Hypercholesteremia    Lumbar degenerative disc disease 11/12/2011   Plantar fasciitis    Sleep apnea    Past Surgical History:  Procedure Laterality Date   INSERTION PROSTATE RADIATION SEED     UVULECTOMY     Family History  Problem Relation Age of Onset   Deep vein thrombosis Mother    Diabetes Mother    Healthy Brother    Healthy Brother    Colon cancer Neg Hx    Rectal cancer Neg Hx    Social History   Socioeconomic History    Marital status: Married    Spouse name: Not on file   Number of children: 2   Years of education: Not on file   Highest education level: Not on file  Occupational History   Occupation: retired  Tobacco Use   Smoking status: Former    Packs/day: 0.25    Years: 40.00    Total pack years: 10.00    Types: Cigarettes    Start date: 02/26/1969    Quit date: 11/23/2017    Years since quitting: 3.8   Smokeless tobacco: Never  Vaping Use   Vaping Use: Never used  Substance and Sexual Activity   Alcohol use: No   Drug use: No   Sexual activity: Not Currently  Other Topics Concern   Not on file  Social History Narrative   Not on file   Social Determinants of Health   Financial Resource Strain: Low Risk  (09/15/2021)   Overall Financial Resource Strain (CARDIA)    Difficulty of Paying Living Expenses: Not hard at all  Food Insecurity: No Food Insecurity (09/15/2021)   Hunger Vital Sign    Worried About Running Out of Food in the Last Year: Never true    Ran Out of Food in the Last Year: Never true  Transportation Needs: No Transportation Needs (09/15/2021)   PRAPARE - Hydrologist (Medical): No    Lack of Transportation (Non-Medical): No  Physical Activity: Insufficiently Active (09/15/2021)   Exercise Vital Sign    Days of Exercise per Week: 3 days    Minutes of Exercise per Session: 30 min  Stress: No Stress Concern Present (09/15/2021)   [place]    Feeling of Stress : Not at all  Social Connections: Moderately Isolated (09/15/2021)   Social Connection and Isolation Panel [NHANES]    Frequency of Communication with Friends and Family: Three times a week    Frequency of Social Gatherings with Friends and Family: Three times a week    Attends Religious Services: Never    Active Member of Clubs or Organizations: No    Attends Music therapist: Never    Marital Status:  Married    Tobacco Counseling Counseling given: Not Answered   Clinical Intake:  Pre-visit preparation completed: Yes  Pain : No/denies pain     Nutritional Risks: None Diabetes: No  How often do you need to have someone help you when you read instructions, pamphlets, or other written materials from your doctor or pharmacy?: 1 - Never What is the last grade level you completed in school?: [place] Needed?: No  Information entered by :: L.Wilson,LPn   Activities of Daily Living    09/15/2021    9:13 AM  In your present state of health, do you have any difficulty performing the following activities:  Hearing? 0  Vision? 0  Difficulty concentrating or making decisions? 0  Walking or climbing stairs? 0  Dressing or bathing? 0  Doing errands, shopping? 0  Preparing Food and eating ? N  Using the Toilet? N  In the past six months, have you accidently leaked urine? N  Do you have problems with loss of bowel control? N  Managing your Medications? N  Managing your Finances? N  Housekeeping or managing your Housekeeping? N    Patient Care Team: Biagio Borg, MD as PCP - Desma Paganini, MD as PCP - Cardiology (Cardiology) Irene Shipper, MD as Consulting Physician (Gastroenterology) Paulla Dolly Tamala Fothergill, DPM as Consulting Physician (Podiatry) Lyndal Pulley, DO as Consulting Physician (Family Medicine) [place], [place] Eduardo Osier., MD as Attending Physician (Urology) Troy Sine, MD as Consulting Physician (Cardiology) Alanda Slim Neena Rhymes, MD as Consulting Physician (Ophthalmology)  Indicate any recent Medical Services you may have received from other than [facility] providers in the past year (date may be approximate).     Assessment:   This is a routine wellness examination for Nyzier.  Hearing/Vision screen Vision Screening - Comments:: Annual eye exams wear glasses   Dietary issues and exercise activities discussed: Current  Exercise Habits: Home exercise routine, Type of exercise: walking, Time (Minutes): 30, Frequency (Times/Week): 3, Weekly Exercise (Minutes/Week): 90, Intensity: Mild, Exercise limited by: None identified   Goals Addressed   None    Depression Screen    09/15/2021    9:13 AM 09/15/2021    9:10 AM 04/06/2021    9:49 AM 04/06/2021    9:16 AM 09/28/2020    9:48 AM 09/09/2020   10:47 AM 09/30/2019   10:47 AM  PHQ 2/9 Scores  PHQ - 2 Score 0 0 0 0 0 0 0  PHQ- 9 Score    0 0  0    Fall Risk    09/15/2021    9:13 AM 04/06/2021    9:49 AM 04/06/2021    9:17 AM 09/28/2020    9:48 AM 09/09/2020   10:47 AM  Fall Risk   Falls in the past year? 0 0 0 0 0  Number falls in past yr: 0 0  0 0  Injury with Fall? 0 0  0 0  Risk for fall due to :     No Fall Risks  Follow up Falls evaluation completed;Education provided    Falls evaluation completed    FALL RISK PREVENTION PERTAINING TO THE HOME:  Any stairs in or around the home? Yes  If so, are there any without handrails? No  Home free of loose throw rugs in walkways, pet beds, electrical cords, etc? Yes  Adequate lighting in your home to reduce risk of falls? Yes   ASSISTIVE DEVICES UTILIZED TO PREVENT FALLS:  Life alert? No  Use of a cane, walker or w/c? No  Grab bars in the bathroom? No  Shower chair or bench in shower? Yes  Elevated toilet seat or a handicapped toilet? No   Cognitive Function:Normal cognitive status assessed by telephone conversation by this Nurse Health Advisor. No abnormalities found.      11/16/2016   10:31 AM  MMSE - Mini Mental State Exam  Orientation to time 5  Orientation to Place 5  Registration 3  Attention/ Calculation 4  Recall 0  Language- name 2 objects 2  Language- repeat  1  Language- follow 3 step command 3  Language- read & follow direction 1  Write a sentence 1  Copy design 1  Total score 26        Immunizations Immunization History  Administered Date(s) Administered   Fluad Quad(high Dose  65+) 10/30/2018, 11/15/2020   Influenza Whole 11/28/2009, 11/06/2010, 11/10/2011   Influenza, High Dose Seasonal PF 11/27/2014, 12/31/2014, 10/17/2016, 10/26/2017   Influenza,inj,Quad PF,6+ Mos 11/11/2012, 12/22/2015, 12/18/2019   Influenza-Unspecified 12/04/2002, 11/27/2003, 12/28/2003, 10/23/2010, 12/28/2011, 10/27/2013, 11/30/2014, 10/28/2018   Moderna Covid-19 Vaccine Bivalent Booster 55yr & up 11/24/2020   Moderna Sars-Covid-2 Vaccination 04/11/2019, 05/09/2019, 01/04/2020, 06/27/2020   Pneumococcal Conjugate-13 11/11/2012, 06/15/2014   Pneumococcal Polysaccharide-23 02/28/2004, 11/13/2012   Pneumococcal-Unspecified 01/05/2004, 12/28/2010   Td 02/27/2000   Tdap 06/15/2014   Tetanus 05/09/2012   Zoster Recombinat (Shingrix) 01/29/2018, 05/07/2018   Zoster, Live 05/19/2013    TDAP status: Up to date  Flu Vaccine status: Up to date  Pneumococcal vaccine status: Up to date  Covid-19 vaccine status: Completed vaccines  Qualifies for Shingles Vaccine? Yes   Zostavax completed Yes   Shingrix Completed?: Yes  Screening Tests Health Maintenance  Topic Date Due   INFLUENZA VACCINE  09/26/2021   TETANUS/TDAP  06/14/2024   Pneumonia Vaccine 81 Years old  Completed   COVID-19 Vaccine  Completed   Zoster Vaccines- Shingrix  Completed   HPV VACCINES  Aged Out   COLONOSCOPY (Pts 45-426yrInsurance coverage will need to be confirmed)  Discontinued    Health Maintenance  There are no preventive care reminders to display for this patient.  Colorectal cancer screening: No longer required.   Lung Cancer Screening: (Low Dose CT Chest recommended if Age 10226-80ears, 30 pack-year currently smoking OR have quit w/in 15years.) does not qualify.   Lung Cancer Screening Referral: n/a  Additional Screening:  Hepatitis C Screening: does not qualify;   Vision Screening: Recommended annual ophthalmology exams for early detection of glaucoma and other disorders of the eye. Is the  patient up to date with their annual eye exam?  Yes  Who is the provider or what is the name of the office in which the patient attends annual eye exams? Dr.Groat  If pt is not established with a provider, would they like to be referred to a provider to establish care? No .   Dental Screening: Recommended annual dental exams for proper oral hygiene  Community Resource Referral / Chronic Care Management: CRR required this visit?  No   CCM required this visit?  No      Plan:     I have personally reviewed and noted the following in the patient's chart:   Medical and social history Use of alcohol, tobacco or illicit drugs  Current medications and supplements including opioid prescriptions. Patient is not currently taking opioid prescriptions. Functional ability and status Nutritional status Physical activity Advanced directives List of other physicians Hospitalizations, surgeries, and ER visits in previous 12 months Vitals Screenings to include cognitive, depression, and falls Referrals and appointments  In addition, I have reviewed and discussed with patient certain preventive protocols, quality metrics, and best practice recommendations. A written personalized care plan for preventive services as well as general preventive health recommendations were provided to patient.     LaRandel PiggLPN   09/02/08/1751 Nurse Notes: none

## 2021-10-04 ENCOUNTER — Ambulatory Visit (INDEPENDENT_AMBULATORY_CARE_PROVIDER_SITE_OTHER): Payer: Medicare Other | Admitting: Internal Medicine

## 2021-10-04 ENCOUNTER — Encounter: Payer: Self-pay | Admitting: Internal Medicine

## 2021-10-04 VITALS — BP 122/68 | HR 76 | Temp 98.2°F | Ht 69.0 in | Wt 194.0 lb

## 2021-10-04 DIAGNOSIS — N289 Disorder of kidney and ureter, unspecified: Secondary | ICD-10-CM

## 2021-10-04 DIAGNOSIS — E7800 Pure hypercholesterolemia, unspecified: Secondary | ICD-10-CM | POA: Diagnosis not present

## 2021-10-04 DIAGNOSIS — I10 Essential (primary) hypertension: Secondary | ICD-10-CM | POA: Diagnosis not present

## 2021-10-04 DIAGNOSIS — N1831 Chronic kidney disease, stage 3a: Secondary | ICD-10-CM | POA: Insufficient documentation

## 2021-10-04 DIAGNOSIS — R7302 Impaired glucose tolerance (oral): Secondary | ICD-10-CM

## 2021-10-04 DIAGNOSIS — Z461 Encounter for fitting and adjustment of hearing aid: Secondary | ICD-10-CM | POA: Insufficient documentation

## 2021-10-04 DIAGNOSIS — N3281 Overactive bladder: Secondary | ICD-10-CM

## 2021-10-04 DIAGNOSIS — C61 Malignant neoplasm of prostate: Secondary | ICD-10-CM | POA: Diagnosis not present

## 2021-10-04 DIAGNOSIS — H903 Sensorineural hearing loss, bilateral: Secondary | ICD-10-CM | POA: Insufficient documentation

## 2021-10-04 DIAGNOSIS — H9319 Tinnitus, unspecified ear: Secondary | ICD-10-CM | POA: Insufficient documentation

## 2021-10-04 DIAGNOSIS — Z86718 Personal history of other venous thrombosis and embolism: Secondary | ICD-10-CM | POA: Insufficient documentation

## 2021-10-04 LAB — LIPID PANEL
Cholesterol: 109 mg/dL (ref 0–200)
HDL: 42.8 mg/dL (ref >39.00)
LDL Cholesterol: 50 mg/dL (ref 0–99)
NonHDL: 66.47
Total CHOL/HDL Ratio: 3
Triglycerides: 81 mg/dL (ref 0.0–149.0)
VLDL: 16.2 mg/dL (ref 0.0–40.0)

## 2021-10-04 LAB — BASIC METABOLIC PANEL
BUN: 13 mg/dL (ref 6–23)
CO2: 30 mEq/L (ref 19–32)
Calcium: 9.7 mg/dL (ref 8.4–10.5)
Chloride: 104 mEq/L (ref 96–112)
Creatinine, Ser: 1.15 mg/dL (ref 0.40–1.50)
GFR: 59.53 mL/min — ABNORMAL LOW (ref >60.00)
Glucose, Bld: 73 mg/dL (ref 70–99)
Potassium: 4.5 mEq/L (ref 3.5–5.1)
Sodium: 138 mEq/L (ref 135–145)

## 2021-10-04 LAB — URINALYSIS, ROUTINE W REFLEX MICROSCOPIC
Bilirubin Urine: NEGATIVE
Hgb urine dipstick: NEGATIVE
Ketones, ur: NEGATIVE
Leukocytes,Ua: NEGATIVE
Nitrite: NEGATIVE
RBC / HPF: NONE SEEN (ref 0–?)
Specific Gravity, Urine: 1.01 (ref 1.000–1.030)
Total Protein, Urine: NEGATIVE
Urine Glucose: NEGATIVE
Urobilinogen, UA: 1 (ref 0.0–1.0)
WBC, UA: NONE SEEN (ref 0–?)
pH: 7 (ref 5.0–8.0)

## 2021-10-04 LAB — HEPATIC FUNCTION PANEL
ALT: 16 U/L (ref 0–53)
AST: 22 U/L (ref 0–37)
Albumin: 4.4 g/dL (ref 3.5–5.2)
Alkaline Phosphatase: 75 U/L (ref 39–117)
Bilirubin, Direct: 0.2 mg/dL (ref 0.0–0.3)
Total Bilirubin: 0.7 mg/dL (ref 0.2–1.2)
Total Protein: 7.3 g/dL (ref 6.0–8.3)

## 2021-10-04 LAB — PSA: PSA: 0 ng/mL — ABNORMAL LOW (ref 0.10–4.00)

## 2021-10-04 LAB — HEMOGLOBIN A1C: Hgb A1c MFr Bld: 5.8 % (ref 4.6–6.5)

## 2021-10-04 MED ORDER — FESOTERODINE FUMARATE ER 4 MG PO TB24: 4.0000 mg | ORAL_TABLET | Freq: Every day | ORAL | 3 refills | Status: DC

## 2021-10-04 NOTE — Patient Instructions (Signed)
Ok to change the vesicare to Toviaz 4 mg per day if ok with the insurance  Please continue all other medications as before, and refills have been done if requested.  Please have the pharmacy call with any other refills you may need.  Please continue your efforts at being more active, low cholesterol diet, and weight control.  Please keep your appointments with your specialists as you may have planned  Please go to the LAB at the blood drawing area for the tests to be done  You will be contacted by phone if any changes need to be made immediately.  Otherwise, you will receive a letter about your results with an explanation, but please check with MyChart first.  Please remember to sign up for MyChart if you have not done so, as this will be important to you in the future with finding out test results, communicating by private email, and scheduling acute appointments online when needed.  Please make an Appointment to return in 6 months, or sooner if needed

## 2021-10-04 NOTE — Progress Notes (Signed)
Patient ID: Kevin Hale, male   DOB: 06-Jun-1940, 81 y.o.   MRN: [id number]        Chief Complaint: follow up new renal insufficiency, OAB, prostate ca, hyperglycemia, htn, hld       HPI:  Kevin Hale is a 81 y.o. male here overall doing ok but c/o worsening all over stiffness but not pain for unclear reason without any specific joint swelling, but mostly seems to be referring to polyarthritic joints of shoulders wrists, hands, hips, back and knees.  Pt denies chest pain, increased sob or doe, wheezing, orthopnea, PND, increased LE swelling, palpitations, dizziness or syncope.   Pt denies polydipsia, polyuria, or new focal neuro s/s.   Pt denies fever, wt loss, night sweats, loss of appetite, or other constitutional symptoms  Does have mild worsening OAB symptoms and vesicare no longer seems to be working as well.  Denies urinary symptoms such as dysuria, urgency, flank pain, hematuria or n/v, fever, chills, and asks for f/u PSA after having hx of prostate Ca s/p seed tx about 6 yrs ago.  No longer seeing urology regularly.  .       Wt Readings from Last 3 Encounters:  10/04/21 194 lb (88 kg)  07/05/21 196 lb (88.9 kg)  04/06/21 203 lb 8 oz (92.3 kg)   BP Readings from Last 3 Encounters:  10/04/21 122/68  07/05/21 131/60  04/06/21 (!) 142/72         Past Medical History:  Diagnosis Date   Anxiety    Atherosclerosis of abdominal aorta (HCC) 11/12/2011   BPH (benign prostatic hyperplasia)    Bronchiectasis ([place]) 03/26/2018   Cancer of prostate ([place])    Chronic anticoagulation 09/12/2016   Coronary artery calcification seen on CT scan 03/26/2018   Diverticulosis of colon    DJD (degenerative joint disease)    left foot, "pt denies"   Glucose intolerance (impaired glucose tolerance) 11/03/2010   History of colon polyps    History of DVT (deep vein thrombosis)    HTN (hypertension)    Hypercholesteremia    Lumbar degenerative disc disease 11/12/2011   Plantar fasciitis    Sleep apnea     Past Surgical History:  Procedure Laterality Date   INSERTION PROSTATE RADIATION SEED     UVULECTOMY      reports that he quit smoking about 3 years ago. His smoking use included cigarettes. He started smoking about 52 years ago. He has a 10.00 pack-year smoking history. He has never used smokeless tobacco. He reports that he does not drink alcohol and does not use drugs. family history includes Deep vein thrombosis in his mother; Diabetes in his mother; Healthy in his brother and brother. Allergies  Allergen Reactions   Pravastatin Other (See Comments)    myalgia   Current Outpatient Medications on File Prior to Visit  Medication Sig Dispense Refill   b complex vitamins tablet Take 1 tablet by mouth daily.     Cholecalciferol (VITAMIN D3) 1000 UNITS CAPS Take 1 Int'l Units by mouth daily.     losartan (COZAAR) 25 MG tablet TAKE 1 TABLET BY MOUTH TWICE DAILY 180 tablet 1   simvastatin (ZOCOR) 20 MG tablet TAKE 1 TABLET BY MOUTH AT BEDTIME 90 tablet 3   XARELTO 20 MG TABS tablet TAKE 1 TABLET BY MOUTH ONCE DAILY WITH SUPPER 90 tablet 0   No current facility-administered medications on file prior to visit.        ROS:  All others  reviewed and negative.  Objective        PE:  BP 122/68 (BP Location: Right Arm, Patient Position: Sitting, Cuff Size: Large)   Pulse 76   Temp 98.2 F (36.8 C) (Oral)   Ht '5\' 9"'$  (1.753 m)   Wt 194 lb (88 kg)   SpO2 97%   BMI 28.65 kg/m                 Constitutional: Pt appears in NAD               HENT: Head: NCAT.                Right Ear: External ear normal.                 Left Ear: External ear normal.                Eyes: . Pupils are equal, round, and reactive to light. Conjunctivae and EOM are normal               Nose: without d/c or deformity               Neck: Neck supple. Gross normal ROM               Cardiovascular: Normal rate and regular rhythm.                 Pulmonary/Chest: Effort normal and breath sounds without rales or  wheezing.                Abd:  Soft, NT, ND, + BS, no organomegaly               Neurological: Pt is alert. At baseline orientation, motor grossly intact               Skin: Skin is warm. No rashes, no other new lesions, LE edema - none               Psychiatric: Pt behavior is normal without agitation   Micro: none  Cardiac tracings I have personally interpreted today:  none  Pertinent Radiological findings (summarize): none   Lab Results  Component Value Date   WBC 6.0 04/06/2021   HGB 15.0 04/06/2021   HCT 46.7 04/06/2021   PLT 228.0 04/06/2021   GLUCOSE 73 10/04/2021   CHOL 109 10/04/2021   TRIG 81.0 10/04/2021   HDL 42.80 10/04/2021   LDLCALC 50 10/04/2021   ALT 16 10/04/2021   AST 22 10/04/2021   NA 138 10/04/2021   K 4.5 10/04/2021   CL 104 10/04/2021   CREATININE 1.15 10/04/2021   BUN 13 10/04/2021   CO2 30 10/04/2021   TSH 1.28 04/06/2021   PSA 0.00 (L) 10/04/2021   INR 2.1 05/28/2017   HGBA1C 5.8 10/04/2021   Assessment/Plan:  Kevin Hale is a 81 y.o. Black or African American [2] male with  has a past medical history of Anxiety, Atherosclerosis of abdominal aorta ([place]) (11/12/2011), BPH (benign prostatic hyperplasia), Bronchiectasis ([place]) (03/26/2018), Cancer of prostate ([place]), Chronic anticoagulation (09/12/2016), Coronary artery calcification seen on CT scan (03/26/2018), Diverticulosis of colon, DJD (degenerative joint disease), Glucose intolerance (impaired glucose tolerance) (11/03/2010), History of colon polyps, History of DVT (deep vein thrombosis), HTN (hypertension), Hypercholesteremia, Lumbar degenerative disc disease (11/12/2011), Plantar fasciitis, and Sleep apnea.  Carcinoma of prostate ([place]) Asympt, also for psa lab f/u per pt request  Impaired glucose tolerance Lab Results  Component Value  Date   HGBA1C 5.8 10/04/2021   Stable, pt to continue current medical treatment  - diet, wt control, activity   Hypercholesteremia Lab Results  Component  Value Date   LDLCALC 50 10/04/2021   Stable, pt to continue current statin zocor 20 mg qd   HTN (hypertension) BP Readings from Last 3 Encounters:  10/04/21 122/68  07/05/21 131/60  04/06/21 (!) 142/72   Stable, pt to continue medical treatment losartan 25 bid   OAB (overactive bladder) Uncontrolled, for UA, and change vesicare to [place] if ok with insurance,  to f/u any worsening symptoms or concerns , consider referral to urology  Renal insufficiency Mild onset recent - ? New onset ckd 3 - for f/u lab today, cont ARB for now  Followup: Return in about 6 months (around 04/06/2022).  Cathlean Cower, MD 10/06/2021 9:05 PM [facility]

## 2021-10-06 ENCOUNTER — Encounter: Payer: Self-pay | Admitting: Internal Medicine

## 2021-10-06 NOTE — Assessment & Plan Note (Signed)
Uncontrolled, for UA, and change vesicare to [place] if ok with insurance,  to f/u any worsening symptoms or concerns , consider referral to urology

## 2021-10-06 NOTE — Assessment & Plan Note (Signed)
Lab Results  Component Value Date   HGBA1C 5.8 10/04/2021   Stable, pt to continue current medical treatment  - diet, wt control, activity

## 2021-10-06 NOTE — Assessment & Plan Note (Signed)
BP Readings from Last 3 Encounters:  10/04/21 122/68  07/05/21 131/60  04/06/21 (!) 142/72   Stable, pt to continue medical treatment losartan 25 bid

## 2021-10-06 NOTE — Assessment & Plan Note (Signed)
Mild onset recent - ? New onset ckd 3 - for f/u lab today, cont ARB for now

## 2021-10-06 NOTE — Assessment & Plan Note (Signed)
Asympt, also for psa lab f/u per pt request

## 2021-10-06 NOTE — Assessment & Plan Note (Signed)
Lab Results  Component Value Date   LDLCALC 50 10/04/2021   Stable, pt to continue current statin zocor 20 mg qd

## 2021-10-10 ENCOUNTER — Telehealth: Payer: Self-pay | Admitting: *Deleted

## 2021-10-10 NOTE — Telephone Encounter (Signed)
Rec'd determination med was APPROVED. EFFECTIVE 10/10/21 through 02/25/2022. Faxed approvals to p[facility].Marland KitchenJohny Chess

## 2021-10-10 NOTE — Telephone Encounter (Signed)
Pt was on cover-my-meds need PA on  Fesoterodine Fumarate ER '4MG'$ . Submitted PA w/ (Key: [id number]) Your information has been sent to [facility]...Johny Chess

## 2021-11-03 ENCOUNTER — Ambulatory Visit (INDEPENDENT_AMBULATORY_CARE_PROVIDER_SITE_OTHER): Payer: Medicare Other | Admitting: Internal Medicine

## 2021-11-03 ENCOUNTER — Ambulatory Visit (INDEPENDENT_AMBULATORY_CARE_PROVIDER_SITE_OTHER): Payer: Medicare Other

## 2021-11-03 VITALS — BP 128/72 | HR 78 | Temp 98.3°F | Ht 69.0 in | Wt 195.0 lb

## 2021-11-03 DIAGNOSIS — M7731 Calcaneal spur, right foot: Secondary | ICD-10-CM | POA: Diagnosis not present

## 2021-11-03 DIAGNOSIS — I10 Essential (primary) hypertension: Secondary | ICD-10-CM

## 2021-11-03 DIAGNOSIS — Z23 Encounter for immunization: Secondary | ICD-10-CM | POA: Diagnosis not present

## 2021-11-03 DIAGNOSIS — R7302 Impaired glucose tolerance (oral): Secondary | ICD-10-CM

## 2021-11-03 DIAGNOSIS — M79671 Pain in right foot: Secondary | ICD-10-CM | POA: Insufficient documentation

## 2021-11-03 NOTE — Patient Instructions (Addendum)
You had the flu shot today  Ok to take the Voltaren gel OTC topicall for pain  You will be contacted regarding the referral for: podiatry  Please continue all other medications as before, and refills have been done if requested.  Please have the pharmacy call with any other refills you may need.  Please continue your efforts at being more active, low cholesterol diet, and weight control.  You are otherwise up to date with prevention measures today.  Please keep your appointments with your specialists as you may have planned  Please go to the XRAY Department in the first floor for the x-ray testing  You will be contacted by phone if any changes need to be made immediately.  Otherwise, you will receive a letter about your results with an explanation, but please check with MyChart first.  Please remember to sign up for MyChart if you have not done so, as this will be important to you in the future with finding out test results, communicating by private email, and scheduling acute appointments online when needed.

## 2021-11-03 NOTE — Progress Notes (Unsigned)
Patient ID: Kevin Hale, male   DOB: Oct 24, 1940, 81 y.o.   MRN: [id number]  .      Chief Complaint: follow up right heel pain x 2 wks       HPI:  Kevin Hale is a 81 y.o. male here with c/o much worsening right heel pain x 2 wks, worse to stand and walk, better to sit.  No trauma, fever, Pt denies chest pain, increased sob or doe, wheezing, orthopnea, PND, increased LE swelling, palpitations, dizziness or syncope.   Pt denies polydipsia, polyuria, or new focal neuro s/s.    Pt denies fever, wt loss, night sweats, loss of appetite, or other constitutional symptoms         Wt Readings from Last 3 Encounters:  11/03/21 195 lb (88.5 kg)  10/04/21 194 lb (88 kg)  07/05/21 196 lb (88.9 kg)   BP Readings from Last 3 Encounters:  11/03/21 128/72  10/04/21 122/68  07/05/21 131/60         Past Medical History:  Diagnosis Date   Anxiety    Atherosclerosis of abdominal aorta (HCC) 11/12/2011   BPH (benign prostatic hyperplasia)    Bronchiectasis ([place]) 03/26/2018   Cancer of prostate ([place])    Chronic anticoagulation 09/12/2016   Coronary artery calcification seen on CT scan 03/26/2018   Diverticulosis of colon    DJD (degenerative joint disease)    left foot, "pt denies"   Glucose intolerance (impaired glucose tolerance) 11/03/2010   History of colon polyps    History of DVT (deep vein thrombosis)    HTN (hypertension)    Hypercholesteremia    Lumbar degenerative disc disease 11/12/2011   Plantar fasciitis    Sleep apnea    Past Surgical History:  Procedure Laterality Date   INSERTION PROSTATE RADIATION SEED     UVULECTOMY      reports that he quit smoking about 3 years ago. His smoking use included cigarettes. He started smoking about 52 years ago. He has a 10.00 pack-year smoking history. He has never used smokeless tobacco. He reports that he does not drink alcohol and does not use drugs. family history includes Deep vein thrombosis in his mother; Diabetes in his mother; Healthy in his  brother and brother. Allergies  Allergen Reactions   Pravastatin Other (See Comments)    myalgia   Current Outpatient Medications on File Prior to Visit  Medication Sig Dispense Refill   b complex vitamins tablet Take 1 tablet by mouth daily.     Cholecalciferol (VITAMIN D3) 1000 UNITS CAPS Take 1 Int'l Units by mouth daily.     fesoterodine (TOVIAZ) 4 MG TB24 tablet Take 1 tablet (4 mg total) by mouth daily. 90 tablet 3   losartan (COZAAR) 25 MG tablet TAKE 1 TABLET BY MOUTH TWICE DAILY 180 tablet 1   simvastatin (ZOCOR) 20 MG tablet TAKE 1 TABLET BY MOUTH AT BEDTIME 90 tablet 3   XARELTO 20 MG TABS tablet TAKE 1 TABLET BY MOUTH ONCE DAILY WITH SUPPER 90 tablet 0   No current facility-administered medications on file prior to visit.        ROS:  All others reviewed and negative.  Objective        PE:  BP 128/72 (BP Location: Right Arm, Patient Position: Sitting, Cuff Size: Large)   Pulse 78   Temp 98.3 F (36.8 C) (Oral)   Ht '5\' 9"'$  (1.753 m)   Wt 195 lb (88.5 kg)   SpO2 94%  BMI 28.80 kg/m                 Constitutional: Pt appears in NAD               HENT: Head: NCAT.                Right Ear: External ear normal.                 Left Ear: External ear normal.                Eyes: . Pupils are equal, round, and reactive to light. Conjunctivae and EOM are normal               Nose: without d/c or deformity               Neck: Neck supple. Gross normal ROM               Cardiovascular: Normal rate and regular rhythm.                 Pulmonary/Chest: Effort normal and breath sounds without rales or wheezing.                Abd:  Soft, NT, ND, + BS, no organomegaly               Neurological: Pt is alert. At baseline orientation, motor grossly intact               Skin: Skin is warm. LE edema - none, but has ? Small corn near point of max tenderness right plantar heel               Psychiatric: Pt behavior is normal without agitation   Micro: none  Cardiac tracings I  have personally interpreted today:  none  Pertinent Radiological findings (summarize): none   Lab Results  Component Value Date   WBC 6.0 04/06/2021   HGB 15.0 04/06/2021   HCT 46.7 04/06/2021   PLT 228.0 04/06/2021   GLUCOSE 73 10/04/2021   CHOL 109 10/04/2021   TRIG 81.0 10/04/2021   HDL 42.80 10/04/2021   LDLCALC 50 10/04/2021   ALT 16 10/04/2021   AST 22 10/04/2021   NA 138 10/04/2021   K 4.5 10/04/2021   CL 104 10/04/2021   CREATININE 1.15 10/04/2021   BUN 13 10/04/2021   CO2 30 10/04/2021   TSH 1.28 04/06/2021   PSA 0.00 (L) 10/04/2021   INR 2.1 05/28/2017   HGBA1C 5.8 10/04/2021   Assessment/Plan:  Kevin Hale is a 81 y.o. Black or African American [2] male with  has a past medical history of Anxiety, Atherosclerosis of abdominal aorta ([place]) (11/12/2011), BPH (benign prostatic hyperplasia), Bronchiectasis ([place]) (03/26/2018), Cancer of prostate ([place]), Chronic anticoagulation (09/12/2016), Coronary artery calcification seen on CT scan (03/26/2018), Diverticulosis of colon, DJD (degenerative joint disease), Glucose intolerance (impaired glucose tolerance) (11/03/2010), History of colon polyps, History of DVT (deep vein thrombosis), HTN (hypertension), Hypercholesteremia, Lumbar degenerative disc disease (11/12/2011), Plantar fasciitis, and Sleep apnea.  Pain of right heel ? Corn related but suspect underlying plantar fasciitis, possibly chronic with possible bone spur - for podiatry referral, volt gel pan, also for xray r/o spurring  HTN (hypertension) BP Readings from Last 3 Encounters:  11/03/21 128/72  10/04/21 122/68  07/05/21 131/60   Stable, pt to continue medical treatment losartan 25 mg qd   Impaired glucose tolerance Lab Results  Component Value Date  HGBA1C 5.8 10/04/2021   Stable, pt to continue current medical treatment - diet, wt control, excercise  Followup: Return in 6 months (on 05/04/2022), or if symptoms worsen or fail to improve.  Cathlean Cower, MD  11/05/2021 9:17 PM [facility]

## 2021-11-05 ENCOUNTER — Encounter: Payer: Self-pay | Admitting: Internal Medicine

## 2021-11-05 NOTE — Assessment & Plan Note (Signed)
BP Readings from Last 3 Encounters:  11/03/21 128/72  10/04/21 122/68  07/05/21 131/60   Stable, pt to continue medical treatment losartan 25 mg qd

## 2021-11-05 NOTE — Assessment & Plan Note (Signed)
Lab Results  Component Value Date   HGBA1C 5.8 10/04/2021   Stable, pt to continue current medical treatment - diet, wt control, excercise

## 2021-11-05 NOTE — Assessment & Plan Note (Signed)
?   Corn related but suspect underlying plantar fasciitis, possibly chronic with possible bone spur - for podiatry referral, volt gel pan, also for xray r/o spurring

## 2021-11-15 ENCOUNTER — Ambulatory Visit: Payer: Medicare Other | Admitting: Podiatry

## 2021-11-15 ENCOUNTER — Encounter: Payer: Self-pay | Admitting: Podiatry

## 2021-11-15 ENCOUNTER — Ambulatory Visit (INDEPENDENT_AMBULATORY_CARE_PROVIDER_SITE_OTHER): Payer: Medicare Other

## 2021-11-15 DIAGNOSIS — M722 Plantar fascial fibromatosis: Secondary | ICD-10-CM | POA: Diagnosis not present

## 2021-11-15 MED ORDER — TRIAMCINOLONE ACETONIDE 10 MG/ML IJ SUSP
10.0000 mg | Freq: Once | INTRAMUSCULAR | Status: AC
  Administered 2021-11-15: 10 mg

## 2021-11-15 NOTE — Progress Notes (Signed)
Subjective:   Patient ID: Kevin Hale, male   DOB: 81 y.o.   MRN: [id number]   HPI Patient states the right heel has been very sore for the last 3 weeks its worse in the bottom and is been a constant type pain.  Patient states that it gets worse with activity and after periods of sitting and does not smoke likes to be active   Review of Systems  All other systems reviewed and are negative.       Objective:  Physical Exam Vitals and nursing note reviewed.  Constitutional:      Appearance: He is well-developed.  Pulmonary:     Effort: Pulmonary effort is normal.  Musculoskeletal:        General: Normal range of motion.  Skin:    General: Skin is warm.  Neurological:     Mental Status: He is alert.     Neurovascular status intact muscle strength adequate range of motion adequate with exquisite discomfort medial fascial band right at the insertional point of the tendon into the calcaneus with inflammation fluid around the medial band.  Patient is found to have good digital perfusion well oriented x3     Assessment:  Acute plantar fasciitis right inflammation fluid in the medial band     Plan:  H&P x-ray reviewed sterile prep done injected the medial band 3 mg Kenalog 5 mg Xylocaine applied fascial brace gave instructions for physical therapy shoe gear modifications reappoint to recheck  X-rays indicate small spur no indication stress fracture arthritis with condition

## 2021-11-29 ENCOUNTER — Encounter: Payer: Self-pay | Admitting: Podiatry

## 2021-11-29 ENCOUNTER — Ambulatory Visit: Payer: Medicare Other | Admitting: Podiatry

## 2021-11-29 DIAGNOSIS — M722 Plantar fascial fibromatosis: Secondary | ICD-10-CM | POA: Diagnosis not present

## 2021-11-29 MED ORDER — TRIAMCINOLONE ACETONIDE 10 MG/ML IJ SUSP
10.0000 mg | Freq: Once | INTRAMUSCULAR | Status: AC
  Administered 2021-11-29: 10 mg

## 2021-11-29 NOTE — Progress Notes (Signed)
Subjective:   Patient ID: Kevin Hale, male   DOB: 81 y.o.   MRN: [id number]   HPI Patient presents stating that he still has a lot of pain in his right heel states it is worse when he gets up in the morning and after periods of sitting and feels like is not stretched properly   ROS      Objective:  Physical Exam  Neurovascular status intact with exquisite discomfort noted plantar aspect right heel at the insertional point tendon calcaneus with inflammation fluid around the medial band     Assessment:  Acute plantar fasciitis right not responding so far with pain worse when sitting or after sleeping     Plan:  H&P reviewed condition I do think night splint in order to provide for complete stretch is necessary and I dispensed this today it was properly fitted to the lower leg and I went ahead also and I did do sterile prep and reinjected the fascia 3 mg Kenalog 5 mg Xylocaine.  Patient tolerated well will be seen back as symptoms indicate may require complete immobilization

## 2021-12-12 ENCOUNTER — Encounter (HOSPITAL_BASED_OUTPATIENT_CLINIC_OR_DEPARTMENT_OTHER): Payer: Self-pay | Admitting: Emergency Medicine

## 2021-12-12 ENCOUNTER — Emergency Department (HOSPITAL_BASED_OUTPATIENT_CLINIC_OR_DEPARTMENT_OTHER): Payer: Medicare Other

## 2021-12-12 ENCOUNTER — Other Ambulatory Visit: Payer: Self-pay

## 2021-12-12 ENCOUNTER — Emergency Department (HOSPITAL_BASED_OUTPATIENT_CLINIC_OR_DEPARTMENT_OTHER)
Admission: EM | Admit: 2021-12-12 | Discharge: 2021-12-12 | Disposition: A | Discharge status: Home / Self Care | Payer: Medicare Other | Attending: Emergency Medicine | Admitting: Emergency Medicine

## 2021-12-12 DIAGNOSIS — Z7901 Long term (current) use of anticoagulants: Secondary | ICD-10-CM | POA: Diagnosis not present

## 2021-12-12 DIAGNOSIS — Z20822 Contact with and (suspected) exposure to covid-19: Secondary | ICD-10-CM | POA: Diagnosis not present

## 2021-12-12 DIAGNOSIS — J069 Acute upper respiratory infection, unspecified: Secondary | ICD-10-CM | POA: Insufficient documentation

## 2021-12-12 DIAGNOSIS — J9811 Atelectasis: Secondary | ICD-10-CM | POA: Insufficient documentation

## 2021-12-12 DIAGNOSIS — Z87891 Personal history of nicotine dependence: Secondary | ICD-10-CM | POA: Insufficient documentation

## 2021-12-12 DIAGNOSIS — R059 Cough, unspecified: Secondary | ICD-10-CM | POA: Diagnosis not present

## 2021-12-12 LAB — RESP PANEL BY RT-PCR (FLU A&B, COVID) ARPGX2
Influenza A by PCR: NEGATIVE
Influenza B by PCR: NEGATIVE
SARS Coronavirus 2 by RT PCR: NEGATIVE

## 2021-12-12 MED ORDER — ALBUTEROL SULFATE HFA 108 (90 BASE) MCG/ACT IN AERS
2.0000 | INHALATION_SPRAY | Freq: Once | RESPIRATORY_TRACT | Status: AC
  Administered 2021-12-12: 2 via RESPIRATORY_TRACT
  Filled 2021-12-12: qty 6.7

## 2021-12-12 NOTE — ED Provider Notes (Signed)
[facility] EMERGENCY DEPT Provider Note   CSN: [id number] Arrival date & time: 12/12/21  1253     History  Chief Complaint  Patient presents with   Cough    Kevin Hale is a 81 y.o. male.  81 yo M with a chief complaints of cough and congestion.  Is been going on for about 3 to 4 days.  He has family members that have been sick recently but does not know of them having anything significant.  He wanted to get checked out before he had something worse happened to him.  No fevers or chills.  Has been eating and drinking.  No abdominal pain nausea vomiting or diarrhea.  Remote history of smoking denies history of asthma or COPD.   Cough      Home Medications Prior to Admission medications   Medication Sig Start Date End Date Taking? Authorizing Provider  b complex vitamins tablet Take 1 tablet by mouth daily.    [provider]  Cholecalciferol (VITAMIN D3) 1000 UNITS CAPS Take 1 Int'l Units by mouth daily.    [provider]  fesoterodine (TOVIAZ) 4 MG TB24 tablet Take 1 tablet (4 mg total) by mouth daily. 10/04/21   Biagio Borg, MD  losartan (COZAAR) 25 MG tablet TAKE 1 TABLET BY MOUTH TWICE DAILY 08/21/21   Fay Records, MD  simvastatin (ZOCOR) 20 MG tablet TAKE 1 TABLET BY MOUTH AT BEDTIME 06/06/21   Biagio Borg, MD  XARELTO 20 MG TABS tablet TAKE 1 TABLET BY MOUTH ONCE DAILY WITH SUPPER 11/09/19   Fay Records, MD      Allergies    Pravastatin    Review of Systems   Review of Systems  Respiratory:  Positive for cough.     Physical Exam Updated Vital Signs BP 135/87 (BP Location: Right Arm)   Pulse 89   Temp 99.3 F (37.4 C) (Oral)   Resp 17   SpO2 100%  Physical Exam Vitals and nursing note reviewed.  Constitutional:      Appearance: He is well-developed.  HENT:     Head: Normocephalic and atraumatic.     Mouth/Throat:     Comments: Swollen turbinates, posterior nasal drip, no noted sinus ttp, tm normal bilaterally.    Eyes:     Pupils: Pupils are equal, round, and reactive to light.  Neck:     Vascular: No JVD.  Cardiovascular:     Rate and Rhythm: Normal rate and regular rhythm.     Heart sounds: No murmur heard.    No friction rub. No gallop.  Pulmonary:     Effort: No respiratory distress.     Breath sounds: No wheezing.     Comments: Coarse breath sounds in all fields with prolonged expiratory effort. Abdominal:     General: There is no distension.     Tenderness: There is no abdominal tenderness. There is no guarding or rebound.  Musculoskeletal:        General: Normal range of motion.     Cervical back: Normal range of motion and neck supple.  Skin:    Coloration: Skin is not pale.     Findings: No rash.  Neurological:     Mental Status: He is alert and oriented to person, place, and time.  Psychiatric:        Behavior: Behavior normal.     ED Results / Procedures / Treatments   Labs (all labs ordered are listed, but only  abnormal results are displayed) Labs Reviewed  RESP PANEL BY RT-PCR (FLU A&B, COVID) [id number]    EKG None  Radiology DG Chest Port 1 View  Result Date: 12/12/2021 CLINICAL DATA:  Cough EXAM: PORTABLE CHEST 1 VIEW COMPARISON:  09/24/2018 FINDINGS: The heart size and mediastinal contours are within normal limits. Aortic atherosclerosis. Mild left basilar atelectasis. Otherwise, no focal airspace consolidation. No pleural effusion or pneumothorax. The visualized skeletal structures are unremarkable. IMPRESSION: Mild left basilar atelectasis. Otherwise, no acute cardiopulmonary findings. Electronically Signed   By: Davina Poke D.O.   On: 12/12/2021 15:39    Procedures Procedures    Medications Ordered in ED Medications  albuterol (VENTOLIN HFA) 108 (90 Base) MCG/ACT inhaler 2 puff (2 puffs Inhalation Given 12/12/21 1607)    ED Course/ Medical Decision Making/ A&P                           Medical Decision Making Amount and/or Complexity of Data  Reviewed Radiology: ordered.  Risk Prescription drug management.   81 yo M with a chief complaints of cough and congestion.  Going on for about 3 to 4 days.  Clinically the patient is a viral syndrome.  He has some adventitious lung sounds consistent with bronchitis.  We will obtain a portable chest x-ray.  2 puffs albuterol reassess.  Chest x-ray independently interpreted by me without focal trait or pneumothorax.  With the patient follow-up with his family doctor in the office.  4:11 PM:  I have discussed the diagnosis/risks/treatment options with the patient.  Evaluation and diagnostic testing in the emergency department does not suggest an emergent condition requiring admission or immediate intervention beyond what has been performed at this time.  They will follow up with PCP. We also discussed returning to the ED immediately if new or worsening sx occur. We discussed the sx which are most concerning (e.g., sudden worsening sob, fever, inability to tolerate by mouth) that necessitate immediate return. Medications administered to the patient during their visit and any new prescriptions provided to the patient are listed below.  Medications given during this visit Medications  albuterol (VENTOLIN HFA) 108 (90 Base) MCG/ACT inhaler 2 puff (2 puffs Inhalation Given 12/12/21 1607)     The patient appears reasonably screen and/or stabilized for discharge and I doubt any other medical condition or other [facility] requiring further screening, evaluation, or treatment in the ED at this time prior to discharge.          Final Clinical Impression(s) / ED Diagnoses Final diagnoses:  URI with cough and congestion    Rx / DC Orders ED Discharge Orders     None         Deno Etienne, DO 12/12/21 1611

## 2021-12-12 NOTE — Discharge Instructions (Signed)
You can use the inhaler up to 2 puffs every 4 hours while awake.  Return for worsening difficulty breathing

## 2021-12-16 DIAGNOSIS — I10 Essential (primary) hypertension: Secondary | ICD-10-CM | POA: Diagnosis not present

## 2021-12-16 DIAGNOSIS — E7800 Pure hypercholesterolemia, unspecified: Secondary | ICD-10-CM | POA: Diagnosis not present

## 2021-12-16 DIAGNOSIS — I82409 Acute embolism and thrombosis of unspecified deep veins of unspecified lower extremity: Secondary | ICD-10-CM | POA: Diagnosis not present

## 2021-12-16 DIAGNOSIS — J069 Acute upper respiratory infection, unspecified: Secondary | ICD-10-CM | POA: Diagnosis not present

## 2021-12-16 DIAGNOSIS — R0602 Shortness of breath: Secondary | ICD-10-CM | POA: Diagnosis not present

## 2021-12-29 NOTE — Progress Notes (Unsigned)
Cardiology Office Note   Date:  01/02/2022   ID:  Kevin Hale, DOB [birth year], MRN [id number]  PCP:  Biagio Borg, MD  Cardiologist:   Dorris Carnes, MD   Pt presents for f/u of DVT?PE    History of Present Illness: Kevin Hale is a 81 y.o. 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     Cholecalciferol (VITAMIN D3) 1000 UNITS CAPS Take 1 Int'l Units by mouth daily.     fesoterodine (TOVIAZ) 4 MG TB24 tablet Take 1 tablet (4 mg total) by mouth daily. 90 tablet 3   losartan (COZAAR) 25 MG tablet TAKE 1 TABLET BY MOUTH TWICE DAILY 180 tablet 1   simvastatin (ZOCOR) 20 MG tablet TAKE 1 TABLET BY MOUTH AT BEDTIME 90 tablet 3   XARELTO 20 MG TABS tablet TAKE 1 TABLET BY MOUTH ONCE DAILY WITH SUPPER 90 tablet 0   No facility-administered medications prior to visit.     Allergies:   Pravastatin   Past Medical History:  Diagnosis Date   Anxiety    Atherosclerosis of abdominal aorta (HCC) 11/12/2011   BPH (benign prostatic hyperplasia)    Bronchiectasis ([place]) 03/26/2018   Cancer of prostate ([place])    Chronic anticoagulation 09/12/2016   Coronary artery calcification seen on CT scan 03/26/2018   Diverticulosis of colon    DJD (degenerative joint disease)    left foot, "pt denies"   Glucose intolerance (impaired glucose tolerance) 11/03/2010   History of colon polyps    History of DVT (deep vein thrombosis)    HTN (hypertension)    Hypercholesteremia    Lumbar degenerative disc disease 11/12/2011   Plantar fasciitis    Sleep apnea     Past Surgical History:  Procedure  Laterality Date   INSERTION PROSTATE RADIATION SEED     UVULECTOMY       Social History:  The patient  reports that he quit smoking about 4 years ago. His smoking use included cigarettes. He started smoking about 52 years ago. He has a 10.00 pack-year smoking history. He has never used smokeless tobacco. He reports that he does not drink alcohol and does not use drugs.   Family History:  The patient's family history includes Deep vein thrombosis in his mother; Diabetes in his mother; Healthy in his brother and brother.    ROS:  Please see the history of present illness. All other systems are reviewed and  Negative to the above problem except as noted.    PHYSICAL EXAM: VS:  BP (!) 140/70   Pulse 79   Ht '5\' 9"'$  (1.753 m)   Wt 195 lb (88.5 kg)   SpO2 96%   BMI 28.80 kg/m   GEN: Overweight 81 yo  in no acute distress  HEENT: normal  Neck: no JVD, no carotid bruits Cardiac: RRR; no murmur  No LE  edema  Respiratory:   Relatively clear   GI: soft, nontender, nondistended, + BS  MS: no deformity Moving all extremities   Skin: warm and dry, no rash Neuro:  Strength and sensation are intact Psych: euthymic mood, full affect   EKG:  EKG is ordered today.   SR 79   LVH with repol abnormality   LAFB Cannot exclude ischemia     Lipid Panel    Component Value Date/Time   CHOL 109 10/04/2021 1154   TRIG 81.0 10/04/2021 1154   HDL 42.80 10/04/2021 1154   CHOLHDL 3 10/04/2021 1154   VLDL 16.2 10/04/2021 1154   LDLCALC 50 10/04/2021 1154   LDLCALC 45 09/30/2019 1205      Wt Readings from Last 3 Encounters:  01/02/22 195 lb (88.5 kg)  11/03/21 195 lb (88.5 kg)  10/04/21 194 lb (88 kg)      ASSESSMENT AND PLAN: 1/  Hx of DVT/PE   Keep on Xarelto   Periodic CBC  Will get today     3  HTN  BP is a little high   He says usually better   Keep log   use CPAP\  4  PVOD  Atherosclerosis of aorta  Good pulses in feet   5  HL   Continue  statin    LIpids good Aug  LDL 50    5  ?  Sleep apnea  Has CPAP   Doesn't use all the time   Encouraged compliance  F/U in 12 months     Stay active     Current medicines are reviewed at length with the patient today.  The patient does not have concerns regarding medicines.  Signed, Dorris Carnes, MD  01/02/2022 9:51 AM    [address], [place], [place]  [address] Phone: [phone]; Fax: [phone]

## 2022-01-02 ENCOUNTER — Encounter: Payer: Self-pay | Admitting: Internal Medicine

## 2022-01-02 ENCOUNTER — Ambulatory Visit: Payer: Medicare Other | Attending: Internal Medicine | Admitting: Internal Medicine

## 2022-01-02 VITALS — BP 140/70 | HR 79 | Ht 69.0 in | Wt 195.0 lb

## 2022-01-02 DIAGNOSIS — Z79899 Other long term (current) drug therapy: Secondary | ICD-10-CM

## 2022-01-02 DIAGNOSIS — I700 Atherosclerosis of aorta: Secondary | ICD-10-CM

## 2022-01-02 LAB — CBC
Hematocrit: 46 % (ref 37.5–51.0)
Hemoglobin: 15.4 g/dL (ref 13.0–17.7)
MCH: 28.7 pg (ref 26.6–33.0)
MCHC: 33.5 g/dL (ref 31.5–35.7)
MCV: 86 fL (ref 79–97)
Platelets: 222 10*3/uL (ref 150–450)
RBC: 5.37 x10E6/uL (ref 4.14–5.80)
RDW: 13.1 % (ref 11.6–15.4)
WBC: 6.6 10*3/uL (ref 3.4–10.8)

## 2022-01-02 NOTE — Patient Instructions (Signed)
Medication Instructions:   *If you need a refill on your cardiac medications before your next appointment, please call your pharmacy*   Lab Work: CBC  If you have labs (blood work) drawn today and your tests are completely normal, you will receive your results only by: [facility] (if you have MyChart) OR A paper copy in the mail If you have any lab test that is abnormal or we need to change your treatment, we will call you to review the results.   Testing/Procedures:    Follow-Up: At [facility], you and your health needs are our priority.  As part of our continuing mission to provide you with exceptional heart care, we have created designated Provider Care Teams.  These Care Teams include your primary Cardiologist (physician) and Advanced Practice Providers (APPs -  Physician Assistants and Nurse Practitioners) who all work together to provide you with the care you need, when you need it.  We recommend signing up for the patient portal called "MyChart".  Sign up information is provided on this After Visit Summary.  MyChart is used to connect with patients for Virtual Visits (Telemedicine).  Patients are able to view lab/test results, encounter notes, upcoming appointments, etc.  Non-urgent messages can be sent to your provider as well.   To learn more about what you can do with MyChart, go to [email or web address].    Your next appointment:   1 year(s)  The format for your next appointment:   In Person  Provider:   Dorris Carnes, MD     Other Instructions   Important Information About Sugar

## 2022-02-12 DIAGNOSIS — R3915 Urgency of urination: Secondary | ICD-10-CM | POA: Diagnosis not present

## 2022-02-27 ENCOUNTER — Emergency Department (HOSPITAL_BASED_OUTPATIENT_CLINIC_OR_DEPARTMENT_OTHER): Payer: Medicare Other

## 2022-02-27 ENCOUNTER — Other Ambulatory Visit (HOSPITAL_BASED_OUTPATIENT_CLINIC_OR_DEPARTMENT_OTHER): Payer: Self-pay

## 2022-02-27 ENCOUNTER — Emergency Department (HOSPITAL_BASED_OUTPATIENT_CLINIC_OR_DEPARTMENT_OTHER)
Admission: EM | Admit: 2022-02-27 | Discharge: 2022-02-27 | Disposition: A | Discharge status: Home / Self Care | Payer: Medicare Other | Attending: Emergency Medicine | Admitting: Emergency Medicine

## 2022-02-27 ENCOUNTER — Other Ambulatory Visit: Payer: Self-pay

## 2022-02-27 DIAGNOSIS — W19XXXA Unspecified fall, initial encounter: Secondary | ICD-10-CM | POA: Insufficient documentation

## 2022-02-27 DIAGNOSIS — J101 Influenza due to other identified influenza virus with other respiratory manifestations: Secondary | ICD-10-CM | POA: Diagnosis not present

## 2022-02-27 DIAGNOSIS — S0990XA Unspecified injury of head, initial encounter: Secondary | ICD-10-CM | POA: Diagnosis present

## 2022-02-27 DIAGNOSIS — N179 Acute kidney failure, unspecified: Secondary | ICD-10-CM | POA: Insufficient documentation

## 2022-02-27 DIAGNOSIS — Z7901 Long term (current) use of anticoagulants: Secondary | ICD-10-CM | POA: Diagnosis not present

## 2022-02-27 DIAGNOSIS — Z20822 Contact with and (suspected) exposure to covid-19: Secondary | ICD-10-CM | POA: Insufficient documentation

## 2022-02-27 DIAGNOSIS — R531 Weakness: Secondary | ICD-10-CM | POA: Diagnosis not present

## 2022-02-27 DIAGNOSIS — Z79899 Other long term (current) drug therapy: Secondary | ICD-10-CM | POA: Diagnosis not present

## 2022-02-27 DIAGNOSIS — S0101XA Laceration without foreign body of scalp, initial encounter: Secondary | ICD-10-CM | POA: Diagnosis not present

## 2022-02-27 DIAGNOSIS — R55 Syncope and collapse: Secondary | ICD-10-CM | POA: Insufficient documentation

## 2022-02-27 DIAGNOSIS — I10 Essential (primary) hypertension: Secondary | ICD-10-CM | POA: Diagnosis not present

## 2022-02-27 DIAGNOSIS — E860 Dehydration: Secondary | ICD-10-CM | POA: Diagnosis not present

## 2022-02-27 LAB — COMPREHENSIVE METABOLIC PANEL
ALT: 15 U/L (ref 0–44)
AST: 24 U/L (ref 15–41)
Albumin: 4.3 g/dL (ref 3.5–5.0)
Alkaline Phosphatase: 61 U/L (ref 38–126)
Anion gap: 9 (ref 5–15)
BUN: 12 mg/dL (ref 8–23)
CO2: 27 mmol/L (ref 22–32)
Calcium: 9.4 mg/dL (ref 8.9–10.3)
Chloride: 102 mmol/L (ref 98–111)
Creatinine, Ser: 1.41 mg/dL — ABNORMAL HIGH (ref 0.61–1.24)
GFR, Estimated: 50 mL/min — ABNORMAL LOW (ref >60)
Glucose, Bld: 181 mg/dL — ABNORMAL HIGH (ref 70–99)
Potassium: 4.1 mmol/L (ref 3.5–5.1)
Sodium: 138 mmol/L (ref 135–145)
Total Bilirubin: 1.1 mg/dL (ref 0.3–1.2)
Total Protein: 7.7 g/dL (ref 6.5–8.1)

## 2022-02-27 LAB — CBC WITH DIFFERENTIAL/PLATELET
Abs Immature Granulocytes: 0.04 10*3/uL (ref 0.00–0.07)
Basophils Absolute: 0 10*3/uL (ref 0.0–0.1)
Basophils Relative: 1 %
Eosinophils Absolute: 0 10*3/uL (ref 0.0–0.5)
Eosinophils Relative: 0 %
HCT: 48.4 % (ref 39.0–52.0)
Hemoglobin: 15.4 g/dL (ref 13.0–17.0)
Immature Granulocytes: 1 %
Lymphocytes Relative: 24 %
Lymphs Abs: 2 10*3/uL (ref 0.7–4.0)
MCH: 28.7 pg (ref 26.0–34.0)
MCHC: 31.8 g/dL (ref 30.0–36.0)
MCV: 90.1 fL (ref 80.0–100.0)
Monocytes Absolute: 0.7 10*3/uL (ref 0.1–1.0)
Monocytes Relative: 9 %
Neutro Abs: 5.6 10*3/uL (ref 1.7–7.7)
Neutrophils Relative %: 65 %
Platelets: 186 10*3/uL (ref 150–400)
RBC: 5.37 MIL/uL (ref 4.22–5.81)
RDW: 13.4 % (ref 11.5–15.5)
WBC: 8.3 10*3/uL (ref 4.0–10.5)
nRBC: 0 % (ref 0.0–0.2)

## 2022-02-27 LAB — RESP PANEL BY RT-PCR (RSV, FLU A&B, COVID)  RVPGX2
Influenza A by PCR: POSITIVE — AB
Influenza B by PCR: NEGATIVE
Resp Syncytial Virus by PCR: NEGATIVE
SARS Coronavirus 2 by RT PCR: NEGATIVE

## 2022-02-27 MED ORDER — OSELTAMIVIR PHOSPHATE 30 MG PO CAPS
30.0000 mg | ORAL_CAPSULE | Freq: Once | ORAL | Status: AC
  Administered 2022-02-27: 30 mg via ORAL
  Filled 2022-02-27: qty 1

## 2022-02-27 MED ORDER — OSELTAMIVIR PHOSPHATE 30 MG PO CAPS
30.0000 mg | ORAL_CAPSULE | Freq: Two times a day (BID) | ORAL | 0 refills | Status: AC
  Filled 2022-02-27: qty 10, 5d supply, fill #0

## 2022-02-27 MED ORDER — OSELTAMIVIR PHOSPHATE 75 MG PO CAPS: 75.0000 mg | ORAL_CAPSULE | Freq: Once | ORAL | Status: DC

## 2022-02-27 MED ORDER — LACTATED RINGERS IV BOLUS
1000.0000 mL | Freq: Once | INTRAVENOUS | Status: AC
  Administered 2022-02-27: 1000 mL via INTRAVENOUS

## 2022-02-27 NOTE — ED Provider Notes (Addendum)
[facility] EMERGENCY DEPT Provider Note   CSN: [id number] Arrival date & time: 02/27/22  1334     History  Chief Complaint  Patient presents with   Loss of Consciousness    Kevin Hale is a 82 y.o. male.   Loss of Consciousness    82 year old male with medical history significant for DVT not on anticoagulation, HTN, HLD, diverticulosis, OSA who presents to the emergency department with multiple complaints.  The patient states that he has had flulike symptoms the past 3 days.  He has had generalized malaise, fatigue, muscle aches.  He stayed up and had an episode of near syncope earlier today.  He became lightheaded and went to the ground striking his head sustaining an abrasion to the top of his scalp.  No full loss of consciousness per the patient and this was witnessed although he was amnestic to the episode.  No seizure-like activity.  His tetanus is up-to-date.  He is sent back to his baseline mental status.  Denied any chest pain or shortness of breath.  He denies any complaints at this time other than generalized malaise and fatigue.  He arrives to the emergency department GCS 15, ABC intact, AAOx4.  Home Medications Prior to Admission medications   Medication Sig Start Date End Date Taking? Authorizing Provider  oseltamivir (TAMIFLU) 30 MG capsule Take 1 capsule (30 mg total) by mouth 2 (two) times daily for 5 days. 02/27/22 03/04/22 Yes Regan Lemming, MD  b complex vitamins tablet Take 1 tablet by mouth daily.    [provider]  Cholecalciferol (VITAMIN D3) 1000 UNITS CAPS Take 1 Int'l Units by mouth daily.    [provider]  fesoterodine (TOVIAZ) 4 MG TB24 tablet Take 1 tablet (4 mg total) by mouth daily. 10/04/21   Biagio Borg, MD  losartan (COZAAR) 25 MG tablet TAKE 1 TABLET BY MOUTH TWICE DAILY 08/21/21   Fay Records, MD  simvastatin (ZOCOR) 20 MG tablet TAKE 1 TABLET BY MOUTH AT BEDTIME 06/06/21   Biagio Borg, MD  XARELTO 20 MG TABS  tablet TAKE 1 TABLET BY MOUTH ONCE DAILY WITH SUPPER 11/09/19   Fay Records, MD      Allergies    Pravastatin    Review of Systems   Review of Systems  Cardiovascular:  Positive for syncope.  Neurological:  Positive for light-headedness.  All other systems reviewed and are negative.   Physical Exam Updated Vital Signs BP 139/74   Pulse 67   Temp 98.2 F (36.8 C)   Resp (!) 30   Ht '5\' 9"'$  (1.753 m)   Wt 88.5 kg   SpO2 97%   BMI 28.80 kg/m  Physical Exam Vitals and nursing note reviewed.  Constitutional:      General: He is not in acute distress.    Appearance: He is well-developed.  HENT:     Head: Normocephalic and atraumatic.  Eyes:     Conjunctiva/sclera: Conjunctivae normal.  Cardiovascular:     Rate and Rhythm: Normal rate and regular rhythm.     Pulses: Normal pulses.  Pulmonary:     Effort: Pulmonary effort is normal. No respiratory distress.     Breath sounds: Normal breath sounds.  Abdominal:     Palpations: Abdomen is soft.     Tenderness: There is no abdominal tenderness.  Musculoskeletal:        General: No swelling.     Cervical back: Neck supple.  Skin:  General: Skin is warm and dry.     Capillary Refill: Capillary refill takes less than 2 seconds.  Neurological:     Mental Status: He is alert.     Comments: MENTAL STATUS EXAM:    Orientation: Alert and oriented to person, place and time.  Memory: Cooperative, follows commands well.  Language: Speech is clear and language is normal.   CRANIAL NERVES:    CN 2 (Optic): Visual fields intact to confrontation.  CN 3,4,6 (EOM): Pupils equal and reactive to light. Full extraocular eye movement without nystagmus.  CN 5 (Trigeminal): Facial sensation is normal, no weakness of masticatory muscles.  CN 7 (Facial): No facial weakness or asymmetry.  CN 8 (Auditory): Auditory acuity grossly normal.  CN 9,10 (Glossophar): The uvula is midline, the palate elevates symmetrically.  CN 11 (spinal access):  Normal sternocleidomastoid and trapezius strength.  CN 12 (Hypoglossal): The tongue is midline. No atrophy or fasciculations.Marland Kitchen   MOTOR:  Muscle Strength: 5/5RUE, 5/5LUE, 5/5RLE, 5/5LLE.   COORDINATION:   Intact finger-to-nose, no tremor.   SENSATION:   Intact to light touch all four extremities.     Psychiatric:        Mood and Affect: Mood normal.     ED Results / Procedures / Treatments   Labs (all labs ordered are listed, but only abnormal results are displayed) Labs Reviewed  RESP PANEL BY RT-PCR (RSV, FLU A&B, COVID)  RVPGX2 - Abnormal; Notable for the following components:      Result Value   Influenza A by PCR POSITIVE (*)    All other components within normal limits  COMPREHENSIVE METABOLIC PANEL - Abnormal; Notable for the following components:   Glucose, Bld 181 (*)    Creatinine, Ser 1.41 (*)    GFR, Estimated 50 (*)    All other components within normal limits  CBC WITH DIFFERENTIAL/PLATELET    EKG EKG Interpretation  Date/Time:  Tuesday February 27 2022 14:20:32 EST Ventricular Rate:  69 PR Interval:  196 QRS Duration: 110 QT Interval:  368 QTC Calculation: 394 R Axis:   -75 Text Interpretation: Normal sinus rhythm Right atrial enlargement Pulmonary disease pattern Left anterior fascicular block Left ventricular hypertrophy with repolarization abnormality ( Cornell product ) No significant change since last tracing When compared with ECG of 27-May-2015 17:12, PREVIOUS ECG IS PRESENT Confirmed by Blanchie Dessert [phone]) on 02/27/2022 2:32:00 PM  Radiology CT Head Wo Contrast  Result Date: 02/27/2022 CLINICAL DATA:  Head trauma.  Syncope. EXAM: CT HEAD WITHOUT CONTRAST TECHNIQUE: Contiguous axial images were obtained from the base of the skull through the vertex without intravenous contrast. RADIATION DOSE REDUCTION: This exam was performed according to the departmental dose-optimization program which includes automated exposure control, adjustment of the mA  and/or kV according to patient size and/or use of iterative reconstruction technique. COMPARISON:  07/19/2011 FINDINGS: Brain: No evidence of acute infarction, hemorrhage, hydrocephalus, extra-axial collection or mass lesion/mass effect. There is mild diffuse low-attenuation within the subcortical and periventricular white matter compatible with chronic microvascular disease. Vascular: No hyperdense vessel or unexpected calcification. Skull: Normal. Negative for fracture or focal lesion. Sinuses/Orbits: Chronic mucoperiosteal thickening and opacification of the maxillary sinuses and sphenoid sinus noted. Mastoid air cells Other: None. IMPRESSION: 1. No acute intracranial abnormalities. 2. Chronic microvascular disease and brain atrophy. 3. Chronic bilateral maxillary and sphenoid sinusitis. Electronically Signed   By: Kerby Moors M.D.   On: 02/27/2022 15:30   DG Chest Port 1 View  Result Date: 02/27/2022 CLINICAL  DATA:  Syncopal episode. EXAM: PORTABLE CHEST 1 VIEW COMPARISON:  Chest x-ray 12/12/2021 FINDINGS: The cardiac silhouette, mediastinal and hilar contours are within normal limits and stable. The lungs are clear of an acute process. No pulmonary lesions or pleural effusions. Stable mild eventration of the right hemidiaphragm. IMPRESSION: No acute cardiopulmonary findings. Electronically Signed   By: Marijo Sanes M.D.   On: 02/27/2022 15:10    Procedures Procedures    Medications Ordered in ED Medications  lactated ringers bolus 1,000 mL (1,000 mLs Intravenous New Bag/Given 02/27/22 1630)  oseltamivir (TAMIFLU) capsule 30 mg (30 mg Oral Given 02/27/22 1631)    ED Course/ Medical Decision Making/ A&P Clinical Course as of 02/27/22 1703  Tue Feb 27, 2022  1534 Influenza A By PCR(!): POSITIVE [JL]  1622 Creatinine(!): 1.41 [JL]    Clinical Course User Index [JL] Regan Lemming, MD                           Medical Decision Making Amount and/or Complexity of Data Reviewed Labs:   Decision-making details documented in ED Course.  Risk Prescription drug management.      82 year old male with medical history significant for DVT not on anticoagulation, HTN, HLD, diverticulosis, OSA who presents to the emergency department with multiple complaints.  The patient states that he has had flulike symptoms the past 3 days.  He has had generalized malaise, fatigue, muscle aches.  He stayed up and had an episode of near syncope earlier today.  He became lightheaded and went to the ground striking his head sustaining an abrasion to the top of his scalp.  No full loss of consciousness per the patient and this was witnessed although he was amnestic to the episode.  No seizure-like activity.  His tetanus is up-to-date.  He is sent back to his baseline mental status.  Denied any chest pain or shortness of breath.  He denies any complaints at this time other than generalized malaise and fatigue.  He arrives to the emergency department GCS 15, ABC intact, AAOx4.  On arrival, the patient was afebrile, not tachycardic, mildly tachypneic RR 24, saturating 98% on room air, hemodynamically stable BP 113/66.  Sinus rhythm noted on cardiac telemetry.  Patient presenting with a near syncopal episode earlier today that certainly sounds vasovagal in nature.  Additional differential includes orthostatic hypotension.  Low concern for cardiac arrhythmia, ACS.   I do not think that this is due to ACS, PE, dysrhythmia, electrolyte abnormality, heart valve abnormality such as aortic stenosis, HOCM, pneumothorax, aortic dissection, ruptured AAA, anemia, hypoglycemia, seizure, stroke, hypovolemia.  On exam, he has intact distal pulses in all extremities, normal capillary refill, a regular heart rate, and normal breath sounds. There is no carotid bruit, unusual abdominal mass, or heart murmur.  As per above, there are no focal neurologic deficits on exam and the patient can ambulate without difficultly.  As per  above, the ECG reveals no evidence of acute ischemia, dysrhythmia, or syncope-associated finding. The CXR was both interpreted by radiology and by myself. There is no evidence of cardiomegaly, pneumonia, pneumothorax, mediastinal widening, acute volume overload, fracture, or other acute, emergent intrathoracic pathology.    CT head was performed which revealed no acute intracranial abnormality and the patient is currently neurologically intact and at his baseline mental status.  Laboratory evaluation positive for influenza A by PCR, CMP with serum creatinine elevated to 1.41, evidence of AKI.   Considered admission for  sedation in the setting of near syncope, AKI and influenza however after discussion with the patient, the patient would prefer to recover at home.  I advised that the patient follow-up with his PCP in a couple of days for recheck of his renal function.  I encouraged continued oral rehydration in the setting of his influenza infection with electrolyte-containing solutions.  The patient was administered an LR bolus 1 L and was started on Tamiflu for treatment of his influenza.   I believe that the patient is stable for discharge based on his reassuring evaluation. At the time of discharge, his vitals were appropriate and he was able to ambulate without difficultly. I provided ED return precautions. The patient agreed to followup outpatient as needed.   Final Clinical Impression(s) / ED Diagnoses Final diagnoses:  Vasovagal near syncope  Dehydration  AKI (acute kidney injury) ([place])  Influenza A  Laceration of scalp, initial encounter    Rx / DC Orders ED Discharge Orders          Ordered    oseltamivir (TAMIFLU) 30 MG capsule  2 times daily        02/27/22 1621              Regan Lemming, MD 02/27/22 1703    Regan Lemming, MD 02/27/22 1703

## 2022-02-27 NOTE — ED Triage Notes (Signed)
Pt arrives pov, to triage in wheelchair with c/o syncope pta. Pt diaphoretic and tachypnea in triage. Pt AOx4. Pts daughter endorses concern for Flu. Endorses thinners, approximated laceration noted to LT side of head, bleeding controlled. Speech clear

## 2022-02-27 NOTE — Discharge Instructions (Addendum)
Please follow-up with your PCP for recheck of your renal function in the next few days to ensure resolution.  Ensure you are continually orally rehydrating at home, take Tylenol and ibuprofen for muscle aches and fever.  Tamiflu has been prescribed.

## 2022-02-27 NOTE — ED Notes (Signed)
ED Provider at bedside. 

## 2022-02-27 NOTE — ED Notes (Signed)
Pt verbalized understanding of discharge instructions. Opportunity for questions provided.  

## 2022-03-01 ENCOUNTER — Telehealth: Payer: Self-pay

## 2022-03-01 ENCOUNTER — Other Ambulatory Visit: Payer: Self-pay | Admitting: Internal Medicine

## 2022-03-01 NOTE — Telephone Encounter (Signed)
     Patient  visit on 1/2  at [place] you been able to follow up with your primary care physician? Yes   The patient was or was not able to obtain any needed medicine or equipment. Yes   Are there diet recommendations that you are having difficulty following?  NA   Patient expresses understanding of discharge instructions and education provided has no other needs at this time.  Ada, [facility], Care Management  [phone] [address], [place], [place] [address] Phone: [phone] Email: [email or web address]

## 2022-03-12 DIAGNOSIS — R310 Gross hematuria: Secondary | ICD-10-CM | POA: Diagnosis not present

## 2022-03-12 DIAGNOSIS — R3915 Urgency of urination: Secondary | ICD-10-CM | POA: Diagnosis not present

## 2022-03-26 DIAGNOSIS — R310 Gross hematuria: Secondary | ICD-10-CM | POA: Diagnosis not present

## 2022-03-26 DIAGNOSIS — K5730 Diverticulosis of large intestine without perforation or abscess without bleeding: Secondary | ICD-10-CM | POA: Diagnosis not present

## 2022-03-26 DIAGNOSIS — N281 Cyst of kidney, acquired: Secondary | ICD-10-CM | POA: Diagnosis not present

## 2022-04-04 DIAGNOSIS — R310 Gross hematuria: Secondary | ICD-10-CM | POA: Diagnosis not present

## 2022-04-04 DIAGNOSIS — N3041 Irradiation cystitis with hematuria: Secondary | ICD-10-CM | POA: Diagnosis not present

## 2022-04-09 ENCOUNTER — Ambulatory Visit: Payer: Medicare Other | Admitting: Internal Medicine

## 2022-04-09 DIAGNOSIS — H40013 Open angle with borderline findings, low risk, bilateral: Secondary | ICD-10-CM | POA: Diagnosis not present

## 2022-04-24 ENCOUNTER — Ambulatory Visit (INDEPENDENT_AMBULATORY_CARE_PROVIDER_SITE_OTHER): Payer: Medicare Other | Admitting: Internal Medicine

## 2022-04-24 VITALS — BP 150/90 | HR 95 | Temp 98.1°F | Ht 69.0 in | Wt 192.0 lb

## 2022-04-24 DIAGNOSIS — I10 Essential (primary) hypertension: Secondary | ICD-10-CM | POA: Diagnosis not present

## 2022-04-24 DIAGNOSIS — E7800 Pure hypercholesterolemia, unspecified: Secondary | ICD-10-CM

## 2022-04-24 DIAGNOSIS — G4733 Obstructive sleep apnea (adult) (pediatric): Secondary | ICD-10-CM

## 2022-04-24 DIAGNOSIS — N3281 Overactive bladder: Secondary | ICD-10-CM | POA: Diagnosis not present

## 2022-04-24 DIAGNOSIS — R7302 Impaired glucose tolerance (oral): Secondary | ICD-10-CM

## 2022-04-24 DIAGNOSIS — E538 Deficiency of other specified B group vitamins: Secondary | ICD-10-CM | POA: Diagnosis not present

## 2022-04-24 DIAGNOSIS — E559 Vitamin D deficiency, unspecified: Secondary | ICD-10-CM

## 2022-04-24 DIAGNOSIS — Z0001 Encounter for general adult medical examination with abnormal findings: Secondary | ICD-10-CM | POA: Diagnosis not present

## 2022-04-24 LAB — CBC WITH DIFFERENTIAL/PLATELET
Basophils Absolute: 0 10*3/uL (ref 0.0–0.1)
Basophils Relative: 0.6 % (ref 0.0–3.0)
Eosinophils Absolute: 0.1 10*3/uL (ref 0.0–0.7)
Eosinophils Relative: 2.4 % (ref 0.0–5.0)
HCT: 48 % (ref 39.0–52.0)
Hemoglobin: 15.5 g/dL (ref 13.0–17.0)
Lymphocytes Relative: 43.7 % (ref 12.0–46.0)
Lymphs Abs: 2.5 10*3/uL (ref 0.7–4.0)
MCHC: 32.3 g/dL (ref 30.0–36.0)
MCV: 88.3 fl (ref 78.0–100.0)
Monocytes Absolute: 0.6 10*3/uL (ref 0.1–1.0)
Monocytes Relative: 10 % (ref 3.0–12.0)
Neutro Abs: 2.5 10*3/uL (ref 1.4–7.7)
Neutrophils Relative %: 43.3 % (ref 43.0–77.0)
Platelets: 215 10*3/uL (ref 150.0–400.0)
RBC: 5.44 Mil/uL (ref 4.22–5.81)
RDW: 13.3 % (ref 11.5–15.5)
WBC: 5.8 10*3/uL (ref 4.0–10.5)

## 2022-04-24 LAB — BASIC METABOLIC PANEL
BUN: 9 mg/dL (ref 6–23)
CO2: 30 mEq/L (ref 19–32)
Calcium: 10 mg/dL (ref 8.4–10.5)
Chloride: 103 mEq/L (ref 96–112)
Creatinine, Ser: 1.04 mg/dL (ref 0.40–1.50)
GFR: 66.91 mL/min (ref >60.00)
Glucose, Bld: 62 mg/dL — ABNORMAL LOW (ref 70–99)
Potassium: 4 mEq/L (ref 3.5–5.1)
Sodium: 139 mEq/L (ref 135–145)

## 2022-04-24 LAB — LIPID PANEL
Cholesterol: 100 mg/dL (ref 0–200)
HDL: 46.5 mg/dL (ref >39.00)
LDL Cholesterol: 39 mg/dL (ref 0–99)
NonHDL: 53.58
Total CHOL/HDL Ratio: 2
Triglycerides: 74 mg/dL (ref 0.0–149.0)
VLDL: 14.8 mg/dL (ref 0.0–40.0)

## 2022-04-24 LAB — URINALYSIS, ROUTINE W REFLEX MICROSCOPIC
Bilirubin Urine: NEGATIVE
Hgb urine dipstick: NEGATIVE
Ketones, ur: NEGATIVE
Leukocytes,Ua: NEGATIVE
Nitrite: NEGATIVE
RBC / HPF: NONE SEEN (ref 0–?)
Specific Gravity, Urine: 1.01 (ref 1.000–1.030)
Total Protein, Urine: NEGATIVE
Urine Glucose: NEGATIVE
Urobilinogen, UA: 0.2 (ref 0.0–1.0)
WBC, UA: NONE SEEN (ref 0–?)
pH: 6.5 (ref 5.0–8.0)

## 2022-04-24 LAB — HEPATIC FUNCTION PANEL
ALT: 13 U/L (ref 0–53)
AST: 20 U/L (ref 0–37)
Albumin: 4 g/dL (ref 3.5–5.2)
Alkaline Phosphatase: 66 U/L (ref 39–117)
Bilirubin, Direct: 0.2 mg/dL (ref 0.0–0.3)
Total Bilirubin: 0.7 mg/dL (ref 0.2–1.2)
Total Protein: 7.3 g/dL (ref 6.0–8.3)

## 2022-04-24 LAB — VITAMIN B12: Vitamin B-12: 852 pg/mL (ref 211–911)

## 2022-04-24 LAB — HEMOGLOBIN A1C: Hgb A1c MFr Bld: 5.5 % (ref 4.6–6.5)

## 2022-04-24 LAB — VITAMIN D 25 HYDROXY (VIT D DEFICIENCY, FRACTURES): VITD: 38.88 ng/mL (ref 30.00–100.00)

## 2022-04-24 LAB — TSH: TSH: 0.66 u[IU]/mL (ref 0.35–5.50)

## 2022-04-24 NOTE — Progress Notes (Signed)
Patient ID: Kevin Hale, male   DOB: 14-Feb-1941, 82 y.o.   MRN: [id number]         Chief Complaint:: wellness exam and osa, urinary urgency, renal insufficiency, hyperglycemia, htn       HPI:  Kevin Hale is a 82 y.o. male here for wellness exam; declines covid booster, o/w up to date  Otherwise, overall doing ok but with several new problems; has hx of OSA and has not been able to tolerate the mask CPAP, a friend of his had INSPIRE placement so he is asking to look into that.  Still has significant daytime somnolence.  Also has worsening urinary urgency and frequency, has to wear depends when leaving the house as cannot make the Brs every time.  Has f/u appt with urology pending.  Pt denies chest pain, increased sob or doe, wheezing, orthopnea, PND, increased LE swelling, palpitations, dizziness or syncope.   Pt denies polydipsia, polyuria, or new focal neuro s/s.   BP has been well controlled at home per pt and has long list of values to prove this today.   Wt Readings from Last 3 Encounters:  04/24/22 192 lb (87.1 kg)  02/27/22 195 lb (88.5 kg)  01/02/22 195 lb (88.5 kg)   BP Readings from Last 3 Encounters:  04/24/22 (!) 150/90  02/27/22 (!) 145/71  01/02/22 (!) 140/70   Immunization History  Administered Date(s) Administered   Fluad Quad(high Dose 65+) 10/30/2018, 11/15/2020, 11/03/2021   Influenza Whole 11/28/2009, 11/06/2010, 11/10/2011   Influenza, High Dose Seasonal PF 11/27/2014, 12/31/2014, 10/17/2016, 10/26/2017   Influenza,inj,Quad PF,6+ Mos 11/11/2012, 12/22/2015, 12/18/2019   Influenza-Unspecified 12/04/2002, 11/27/2003, 12/28/2003, 10/23/2010, 12/28/2011, 10/27/2013, 11/30/2014, 10/28/2018   Moderna Covid-19 Vaccine Bivalent Booster 75yr & up 11/24/2020   Moderna Sars-Covid-2 Vaccination 04/11/2019, 05/09/2019, 01/04/2020, 06/27/2020   Pneumococcal Conjugate-13 11/11/2012, 06/15/2014   Pneumococcal Polysaccharide-23 02/28/2004, 11/13/2012   Pneumococcal-Unspecified  01/05/2004, 12/28/2010   Td 02/27/2000   Td (Adult),unspecified 05/09/2012   Tdap 06/15/2014   Tetanus 05/09/2012   Zoster Recombinat (Shingrix) 01/29/2018, 05/07/2018   Zoster, Live 05/19/2013  There are no preventive care reminders to display for this patient.    Past Medical History:  Diagnosis Date   Anxiety    Atherosclerosis of abdominal aorta (HCC) 11/12/2011   BPH (benign prostatic hyperplasia)    [place]chiectasis ([place] 03/26/2018   Cancer of prostate ([place]    Chronic anticoagulation 09/12/2016   Coronary artery calcification seen on CT scan 03/26/2018   Diverticulosis of colon    DJD (degenerative joint disease)    left foot, "pt denies"   Glucose intolerance (impaired glucose tolerance) 11/03/2010   History of colon polyps    History of DVT (deep vein thrombosis)    HTN (hypertension)    Hypercholesteremia    Lumbar degenerative disc disease 11/12/2011   Plantar fasciitis    Sleep apnea    Past Surgical History:  Procedure Laterality Date   INSERTION PROSTATE RADIATION SEED     UVULECTOMY      reports that he quit smoking about 4 years ago. His smoking use included cigarettes. He started smoking about 53 years ago. He has a 10.00 pack-year smoking history. He has never used smokeless tobacco. He reports that he does not drink alcohol and does not use drugs. family history includes Deep vein thrombosis in his mother; Diabetes in his mother; Healthy in his brother and brother. Allergies  Allergen Reactions   Pravastatin Other (See Comments)    myalgia   Current  Outpatient Medications on File Prior to Visit  Medication Sig Dispense Refill   b complex vitamins tablet Take 1 tablet by mouth daily.     Cholecalciferol (VITAMIN D3) 1000 UNITS CAPS Take 1 Int'l Units by mouth daily.     fesoterodine (TOVIAZ) 4 MG TB24 tablet Take 1 tablet (4 mg total) by mouth daily. 90 tablet 3   losartan (COZAAR) 25 MG tablet Take 1 tablet by mouth twice daily 180 tablet 3   simvastatin  (ZOCOR) 20 MG tablet TAKE 1 TABLET BY MOUTH AT BEDTIME 90 tablet 3   XARELTO 20 MG TABS tablet TAKE 1 TABLET BY MOUTH ONCE DAILY WITH SUPPER 90 tablet 0   No current facility-administered medications on file prior to visit.        ROS:  All others reviewed and negative.  Objective        PE:  BP (!) 150/90 (BP Location: Right Arm, Patient Position: Sitting, Cuff Size: Large)   Pulse 95   Temp 98.1 F (36.7 C) (Oral)   Ht '5\' 9"'$  (1.753 m)   Wt 192 lb (87.1 kg)   SpO2 94%   BMI 28.35 kg/m                 Constitutional: Pt appears in NAD               HENT: Head: NCAT.                Right Ear: External ear normal.                 Left Ear: External ear normal.                Eyes: . Pupils are equal, round, and reactive to light. Conjunctivae and EOM are normal               Nose: without d/c or deformity               Neck: Neck supple. Gross normal ROM               Cardiovascular: Normal rate and regular rhythm.                 Pulmonary/Chest: Effort normal and breath sounds without rales or wheezing.                Abd:  Soft, NT, ND, + BS, no organomegaly               Neurological: Pt is alert. At baseline orientation, motor grossly intact               Skin: Skin is warm. No rashes, no other new lesions, LE edema - none               Psychiatric: Pt behavior is normal without agitation , mod nervous  Micro: none  Cardiac tracings I have personally interpreted today:  none  Pertinent Radiological findings (summarize): none   Lab Results  Component Value Date   WBC 5.8 04/24/2022   HGB 15.5 04/24/2022   HCT 48.0 04/24/2022   PLT 215.0 04/24/2022   GLUCOSE 62 (L) 04/24/2022   CHOL 100 04/24/2022   TRIG 74.0 04/24/2022   HDL 46.50 04/24/2022   LDLCALC 39 04/24/2022   ALT 13 04/24/2022   AST 20 04/24/2022   NA 139 04/24/2022   K 4.0 04/24/2022   CL 103 04/24/2022   CREATININE 1.04 04/24/2022  BUN 9 04/24/2022   CO2 30 04/24/2022   TSH 0.66 04/24/2022   PSA  0.00 (L) 10/04/2021   INR 2.1 05/28/2017   HGBA1C 5.5 04/24/2022   Assessment/Plan:  Kevin Hale is a 82 y.o. Black or African American [2] male with  has a past medical history of Anxiety, Atherosclerosis of abdominal aorta ([place]) (11/12/2011), BPH (benign prostatic hyperplasia), Bronchiectasis ([place]) (03/26/2018), Cancer of prostate ([place]), Chronic anticoagulation (09/12/2016), Coronary artery calcification seen on CT scan (03/26/2018), Diverticulosis of colon, DJD (degenerative joint disease), Glucose intolerance (impaired glucose tolerance) (11/03/2010), History of colon polyps, History of DVT (deep vein thrombosis), HTN (hypertension), Hypercholesteremia, Lumbar degenerative disc disease (11/12/2011), Plantar fasciitis, and Sleep apnea.  Encounter for well adult exam with abnormal findings Age and sex appropriate education and counseling updated with regular exercise and diet Referrals for preventative services - none needed Immunizations addressed - declines covid booster Smoking counseling  - none needed Evidence for depression or other mood disorder - none significant Most recent labs reviewed. I have personally reviewed and have noted: 1) the patient's medical and social history 2) The patient's current medications and supplements 3) The patient's height, weight, and BMI have been recorded in the chart   HTN (hypertension) BP Readings from Last 3 Encounters:  04/24/22 (!) 150/90  02/27/22 (!) 145/71  01/02/22 (!) 140/70   Persistently uncontrolled here, but pt is adamant is controlled at home, pt to continue medical treatment losartan 25 mg qd   Hypercholesteremia Lab Results  Component Value Date   LDLCALC 39 04/24/2022   Stable, pt to continue current statin zocor 20 mg qd   Impaired glucose tolerance Lab Results  Component Value Date   HGBA1C 5.5 04/24/2022   Stable, pt to continue current medical treatment  - diet , wt control   OAB (overactive bladder) Worsening ,  Lisbeth Ply Kevin not be working well enough, pt has f/u appt with urology to address as well as BPH  Obstructive sleep apnea Ok for pt request for general surgury referral for INSPIRE if there is Garment/textile technologist certified in this  Followup: Return in about 6 months (around 10/23/2022).  Cathlean Cower, MD 04/28/2022 2:34 PM [facility]

## 2022-04-24 NOTE — Patient Instructions (Signed)
Please check your BP at home, and if usually < 130/80 we should be ok, but call if usually more than this  Please continue all other medications as before, and refills have been done if requested.  Please have the pharmacy call with any other refills you may need.  Please continue your efforts at being more active, low cholesterol diet, and weight control..  Please keep your appointments with your specialists as you may have planned  You will be contacted regarding the referral for: General Surgury for the INSPIRE  Please go to the LAB at the blood drawing area for the tests to be done  You will be contacted by phone if any changes need to be made immediately.  Otherwise, you will receive a letter about your results with an explanation, but please check with MyChart first.  Please remember to sign up for MyChart if you have not done so, as this will be important to you in the future with finding out test results, communicating by private email, and scheduling acute appointments online when needed.  Please make an Appointment to return in 6 months, or sooner if needed

## 2022-04-28 ENCOUNTER — Encounter: Payer: Self-pay | Admitting: Internal Medicine

## 2022-04-28 NOTE — Assessment & Plan Note (Signed)
Worsening , Lisbeth Ply may not be working well enough, pt has f/u appt with urology to address as well as BPH

## 2022-04-28 NOTE — Assessment & Plan Note (Signed)

## 2022-04-28 NOTE — Assessment & Plan Note (Signed)
[place] for pt request for general surgury referral for INSPIRE if there is Garment/textile technologist certified in this

## 2022-04-28 NOTE — Assessment & Plan Note (Signed)
Lab Results  Component Value Date   LDLCALC 39 04/24/2022   Stable, pt to continue current statin zocor 20 mg qd

## 2022-04-28 NOTE — Assessment & Plan Note (Signed)
Lab Results  Component Value Date   HGBA1C 5.5 04/24/2022   Stable, pt to continue current medical treatment  - diet , wt control

## 2022-04-28 NOTE — Assessment & Plan Note (Signed)
BP Readings from Last 3 Encounters:  04/24/22 (!) 150/90  02/27/22 (!) 145/71  01/02/22 (!) 140/70   Persistently uncontrolled here, but pt is adamant is controlled at home, pt to continue medical treatment losartan 25 mg qd

## 2022-05-14 DIAGNOSIS — N3041 Irradiation cystitis with hematuria: Secondary | ICD-10-CM | POA: Diagnosis not present

## 2022-05-16 ENCOUNTER — Telehealth: Payer: Self-pay | Admitting: Internal Medicine

## 2022-05-16 DIAGNOSIS — G4733 Obstructive sleep apnea (adult) (pediatric): Secondary | ICD-10-CM

## 2022-05-16 NOTE — Telephone Encounter (Signed)
Ok the referral is done 

## 2022-05-16 NOTE — Telephone Encounter (Signed)
Please advise, patient may need another referral

## 2022-05-16 NOTE — Telephone Encounter (Signed)
Spoke with patient who states that ENT on [facility] does the procedure

## 2022-05-16 NOTE — Telephone Encounter (Signed)
Pt called in stating when he call over to [facility] he spoke to someone and the Dr. told him she no longer does that type of surgery. Pt want to have a talk with his Dr.to see what's going on.   Please call pt back with update.

## 2022-05-16 NOTE — Telephone Encounter (Signed)
I am not aware of any specific surgical practice that does the INSPIRE implantation, and [facility] is the only group in town.  If the patient would like another referral, please ask him to contact a different general surgury department such as in [place], [place] or [place] to see if this is a procedure that might be available there.  Thanks

## 2022-06-12 ENCOUNTER — Other Ambulatory Visit: Payer: Self-pay | Admitting: Internal Medicine

## 2022-06-13 DIAGNOSIS — R059 Cough, unspecified: Secondary | ICD-10-CM | POA: Diagnosis not present

## 2022-06-13 DIAGNOSIS — J209 Acute bronchitis, unspecified: Secondary | ICD-10-CM | POA: Diagnosis not present

## 2022-06-20 DIAGNOSIS — J209 Acute bronchitis, unspecified: Secondary | ICD-10-CM | POA: Diagnosis not present

## 2022-07-10 DIAGNOSIS — G4733 Obstructive sleep apnea (adult) (pediatric): Secondary | ICD-10-CM | POA: Diagnosis not present

## 2022-07-26 ENCOUNTER — Other Ambulatory Visit: Payer: Self-pay | Admitting: Otolaryngology

## 2022-08-13 ENCOUNTER — Telehealth: Payer: Self-pay | Admitting: *Deleted

## 2022-08-13 NOTE — Telephone Encounter (Signed)
   Pre-operative Risk Assessment    Patient Name: Kevin Hale  DOB: 10/13/1940 MRN: [id number]      Request for Surgical Clearance    Procedure:   DRUG INDUCED SLEEP ENDOSCOPY  Date of Surgery:  Clearance 08/21/22                                 Surgeon:  DR. Karren Burly BATES Surgeon's Group or Practice Name:  [facility]  Phone number:  [phone] Fax number:  [phone] ATTN: [facility] SURGERY COORDINATOR   Type of Clearance Requested:   - Medical  - Pharmacy:  Hold Rivaroxaban (Xarelto)     Type of Anesthesia:  General    Additional requests/questions:    Elpidio Anis   08/13/2022, 2:32 PM

## 2022-08-13 NOTE — Telephone Encounter (Signed)
Left message to call back to schedule a tele pre op appt.  ?

## 2022-08-13 NOTE — Telephone Encounter (Signed)
   Name: Kevin Hale  DOB: 12-Apr-1940  MRN: [id number]  Primary Cardiologist: Dietrich Pates, MD   Preoperative team, please contact this patient and set up a phone call appointment for further preoperative risk assessment. Please obtain consent and complete medication review. Thank you for your help.  I confirm that guidance regarding antiplatelet and oral anticoagulation therapy has been completed and, if necessary, noted below.  Per office protocol, patient can hold Xarelto for 1-2 days prior to procedure.     Joylene Grapes, NP 08/13/2022, 4:57 PM [facility]

## 2022-08-13 NOTE — Telephone Encounter (Signed)
Patient with diagnosis of Hx of DVT/PE  on Xarelto for anticoagulation.    Procedure: DRUG INDUCED SLEEP ENDOSCOPY  Date of procedure: 08/21/22   CrCl 60 mL/min (04/24/22) Platelet count 215 K (04/24/2022)    Per office protocol, patient can hold Xarelto for 1-2 days prior to procedure.     **This guidance is not considered finalized until pre-operative APP has relayed final recommendations.**

## 2022-08-13 NOTE — Progress Notes (Signed)
Patient planned procudure here at [facility]. Will need to have cards to let us know of xarelto hold time. Message left for Dr. Jenne Pane nurse for the above mentioned.

## 2022-08-14 ENCOUNTER — Telehealth: Payer: Self-pay | Admitting: Cardiovascular Disease

## 2022-08-14 ENCOUNTER — Telehealth: Payer: Self-pay | Admitting: *Deleted

## 2022-08-14 ENCOUNTER — Other Ambulatory Visit: Payer: Self-pay

## 2022-08-14 ENCOUNTER — Encounter (HOSPITAL_BASED_OUTPATIENT_CLINIC_OR_DEPARTMENT_OTHER): Payer: Self-pay

## 2022-08-14 ENCOUNTER — Encounter (HOSPITAL_BASED_OUTPATIENT_CLINIC_OR_DEPARTMENT_OTHER): Payer: Self-pay | Admitting: Otolaryngology

## 2022-08-14 ENCOUNTER — Encounter: Payer: Self-pay | Admitting: Cardiology

## 2022-08-14 NOTE — Telephone Encounter (Signed)
Dav[facility] with [facility] calling to see if Dr. Tresa Endo would order a home sleep test for he patient. Phone: [phone]

## 2022-08-14 NOTE — Telephone Encounter (Signed)
Pt has been scheduled for tele pre op appt 08/17/22 @ 3 pm, add on due to proc date and med hold.    Med rec and consent are done.

## 2022-08-14 NOTE — Telephone Encounter (Signed)
error 

## 2022-08-14 NOTE — Telephone Encounter (Signed)
Pt has been scheduled for tele pre op appt 08/17/22 @ 3 pm, add on due to proc date and med hold.   Med rec and consent are done.    Patient Consent for Virtual Visit        Kevin Hale has provided verbal consent on 08/14/2022 for a virtual visit (video or telephone).   CONSENT FOR VIRTUAL VISIT FOR:  Kevin Hale  By participating in this virtual visit I agree to the following:  I hereby voluntarily request, consent and authorize [facility] and its employed or contracted physicians, physician assistants, nurse practitioners or other licensed health care professionals (the Practitioner), to provide me with telemedicine health care services (the "Services") as deemed necessary by the treating Practitioner. I acknowledge and consent to receive the Services by the Practitioner via telemedicine. I understand that the telemedicine visit will involve communicating with the Practitioner through live audiovisual communication technology and the disclosure of certain medical information by electronic transmission. I acknowledge that I have been given the opportunity to request an in-person assessment or other available alternative prior to the telemedicine visit and am voluntarily participating in the telemedicine visit.  I understand that I have the right to withhold or withdraw my consent to the use of telemedicine in the course of my care at any time, without affecting my right to future care or treatment, and that the Practitioner or I may terminate the telemedicine visit at any time. I understand that I have the right to inspect all information obtained and/or recorded in the course of the telemedicine visit and may receive copies of available information for a reasonable fee.  I understand that some of the potential risks of receiving the Services via telemedicine include:  Delay or interruption in medical evaluation due to technological equipment failure or disruption; Information  transmitted may not be sufficient (e.g. poor resolution of images) to allow for appropriate medical decision making by the Practitioner; and/or  In rare instances, security protocols could fail, causing a breach of personal health information.  Furthermore, I acknowledge that it is my responsibility to provide information about my medical history, conditions and care that is complete and accurate to the best of my ability. I acknowledge that Practitioner's advice, recommendations, and/or decision may be based on factors not within their control, such as incomplete or inaccurate data provided by me or distortions of diagnostic images or specimens that may result from electronic transmissions. I understand that the practice of medicine is not an exact science and that Practitioner makes no warranties or guarantees regarding treatment outcomes. I acknowledge that a copy of this consent can be made available to me via my patient portal [facility] MyChart), or I can request a printed copy by calling the office of [facility].    I understand that my insurance will be billed for this visit.   I have read or had this consent read to me. I understand the contents of this consent, which adequately explains the benefits and risks of the Services being provided via telemedicine.  I have been provided ample opportunity to ask questions regarding this consent and the Services and have had my questions answered to my satisfaction. I give my informed consent for the services to be provided through the use of telemedicine in my medical care

## 2022-08-16 NOTE — Telephone Encounter (Signed)
Today okay to order a home sleep study through [facility] with me to read.

## 2022-08-16 NOTE — Progress Notes (Signed)
Contacted patient as part of preoperative protocol.  He reports that he no longer wants to proceed with planned procedure.  He reports that he may want to go ahead with the procedure in the future.  However, at this time he wishes to defer evaluation.  I instructed him to reach out to requesting office when he is ready to proceed.  He expressed understanding.

## 2022-08-17 ENCOUNTER — Ambulatory Visit: Payer: Medicare Other | Attending: Cardiovascular Disease

## 2022-08-17 DIAGNOSIS — Z01810 Encounter for preprocedural cardiovascular examination: Secondary | ICD-10-CM

## 2022-08-20 ENCOUNTER — Other Ambulatory Visit: Payer: Self-pay

## 2022-08-20 DIAGNOSIS — G4733 Obstructive sleep apnea (adult) (pediatric): Secondary | ICD-10-CM

## 2022-08-21 ENCOUNTER — Ambulatory Visit (HOSPITAL_BASED_OUTPATIENT_CLINIC_OR_DEPARTMENT_OTHER): Admission: RE | Admit: 2022-08-21 | Payer: Medicare Other | Source: Home / Self Care | Admitting: Otolaryngology

## 2022-08-21 DIAGNOSIS — Z01818 Encounter for other preprocedural examination: Secondary | ICD-10-CM

## 2022-08-21 HISTORY — DX: Personal history of pulmonary embolism: Z86.711

## 2022-08-21 SURGERY — DRUG INDUCED SLEEP ENDOSCOPY
Anesthesia: General | Laterality: Bilateral

## 2022-08-23 NOTE — Addendum Note (Signed)
Addended by: Brunetta Genera on: 08/23/2022 03:19 PM   Modules accepted: Orders

## 2022-09-06 ENCOUNTER — Other Ambulatory Visit: Payer: Self-pay

## 2022-09-06 ENCOUNTER — Other Ambulatory Visit: Payer: Self-pay | Admitting: Internal Medicine

## 2022-09-26 ENCOUNTER — Encounter (INDEPENDENT_AMBULATORY_CARE_PROVIDER_SITE_OTHER): Payer: Self-pay

## 2022-10-03 DIAGNOSIS — N3041 Irradiation cystitis with hematuria: Secondary | ICD-10-CM | POA: Diagnosis not present

## 2022-10-20 DIAGNOSIS — R509 Fever, unspecified: Secondary | ICD-10-CM | POA: Diagnosis not present

## 2022-10-20 DIAGNOSIS — R059 Cough, unspecified: Secondary | ICD-10-CM | POA: Diagnosis not present

## 2022-10-20 DIAGNOSIS — M542 Cervicalgia: Secondary | ICD-10-CM | POA: Diagnosis not present

## 2022-10-20 DIAGNOSIS — R0602 Shortness of breath: Secondary | ICD-10-CM | POA: Diagnosis not present

## 2022-10-20 DIAGNOSIS — J40 Bronchitis, not specified as acute or chronic: Secondary | ICD-10-CM | POA: Diagnosis not present

## 2022-10-20 DIAGNOSIS — I10 Essential (primary) hypertension: Secondary | ICD-10-CM | POA: Diagnosis not present

## 2022-10-23 ENCOUNTER — Ambulatory Visit: Payer: Medicare Other | Admitting: Internal Medicine

## 2022-10-23 ENCOUNTER — Encounter: Payer: Self-pay | Admitting: Internal Medicine

## 2022-10-23 VITALS — BP 136/80 | HR 77 | Temp 98.0°F | Ht 69.0 in | Wt 190.0 lb

## 2022-10-23 DIAGNOSIS — R7302 Impaired glucose tolerance (oral): Secondary | ICD-10-CM

## 2022-10-23 DIAGNOSIS — I10 Essential (primary) hypertension: Secondary | ICD-10-CM

## 2022-10-23 DIAGNOSIS — E7800 Pure hypercholesterolemia, unspecified: Secondary | ICD-10-CM

## 2022-10-23 DIAGNOSIS — M5136 Other intervertebral disc degeneration, lumbar region: Secondary | ICD-10-CM

## 2022-10-23 DIAGNOSIS — Z720 Tobacco use: Secondary | ICD-10-CM

## 2022-10-23 DIAGNOSIS — Z23 Encounter for immunization: Secondary | ICD-10-CM

## 2022-10-23 DIAGNOSIS — E559 Vitamin D deficiency, unspecified: Secondary | ICD-10-CM | POA: Diagnosis not present

## 2022-10-23 LAB — BASIC METABOLIC PANEL
BUN: 17 mg/dL (ref 6–23)
CO2: 29 mEq/L (ref 19–32)
Calcium: 9.8 mg/dL (ref 8.4–10.5)
Chloride: 102 mEq/L (ref 96–112)
Creatinine, Ser: 1.16 mg/dL (ref 0.40–1.50)
GFR: 58.48 mL/min — ABNORMAL LOW (ref >60.00)
Glucose, Bld: 99 mg/dL (ref 70–99)
Potassium: 3.9 mEq/L (ref 3.5–5.1)
Sodium: 139 mEq/L (ref 135–145)

## 2022-10-23 LAB — CBC WITH DIFFERENTIAL/PLATELET
Basophils Absolute: 0 10*3/uL (ref 0.0–0.1)
Basophils Relative: 0.3 % (ref 0.0–3.0)
Eosinophils Absolute: 0 10*3/uL (ref 0.0–0.7)
Eosinophils Relative: 0 % (ref 0.0–5.0)
HCT: 49.1 % (ref 39.0–52.0)
Hemoglobin: 15.4 g/dL (ref 13.0–17.0)
Lymphocytes Relative: 37.9 % (ref 12.0–46.0)
Lymphs Abs: 2.5 10*3/uL (ref 0.7–4.0)
MCHC: 31.4 g/dL (ref 30.0–36.0)
MCV: 87.4 fl (ref 78.0–100.0)
Monocytes Absolute: 0.9 10*3/uL (ref 0.1–1.0)
Monocytes Relative: 13.4 % — ABNORMAL HIGH (ref 3.0–12.0)
Neutro Abs: 3.2 10*3/uL (ref 1.4–7.7)
Neutrophils Relative %: 48.4 % (ref 43.0–77.0)
Platelets: 176 10*3/uL (ref 150.0–400.0)
RBC: 5.62 Mil/uL (ref 4.22–5.81)
RDW: 13.3 % (ref 11.5–15.5)
WBC: 6.6 10*3/uL (ref 4.0–10.5)

## 2022-10-23 LAB — LIPID PANEL
Cholesterol: 105 mg/dL (ref 0–200)
HDL: 40.6 mg/dL (ref >39.00)
LDL Cholesterol: 44 mg/dL (ref 0–99)
NonHDL: 63.98
Total CHOL/HDL Ratio: 3
Triglycerides: 102 mg/dL (ref 0.0–149.0)
VLDL: 20.4 mg/dL (ref 0.0–40.0)

## 2022-10-23 LAB — HEPATIC FUNCTION PANEL
ALT: 32 U/L (ref 0–53)
AST: 35 U/L (ref 0–37)
Albumin: 4.1 g/dL (ref 3.5–5.2)
Alkaline Phosphatase: 71 U/L (ref 39–117)
Bilirubin, Direct: 0.3 mg/dL (ref 0.0–0.3)
Total Bilirubin: 0.9 mg/dL (ref 0.2–1.2)
Total Protein: 7.5 g/dL (ref 6.0–8.3)

## 2022-10-23 LAB — HEMOGLOBIN A1C: Hgb A1c MFr Bld: 5.9 % (ref 4.6–6.5)

## 2022-10-23 LAB — VITAMIN D 25 HYDROXY (VIT D DEFICIENCY, FRACTURES): VITD: 54.98 ng/mL (ref 30.00–100.00)

## 2022-10-23 NOTE — Progress Notes (Signed)
The test results show that your current treatment is OK, as the tests are stable.  Please continue the same plan.  There is no other need for change of treatment or further evaluation based on these results, at this time.  thanks 

## 2022-10-23 NOTE — Progress Notes (Unsigned)
Patient ID: EUFEMIO STFLEUR, male   DOB: [birth year], 82 y.o.   MRN: [id number]        Chief Complaint: follow up HTN, HLD and hyperglycemia , recent covid infection, low back pain       HPI:  Kevin Hale is a 82 y.o. male here overall doing ok,  Pt denies chest pain, increased sob or doe, wheezing, orthopnea, PND, increased LE swelling, palpitations, dizziness or syncope.   Pt denies polydipsia, polyuria, or new focal neuro s/s.    Pt denies fever, wt loss, night sweats, loss of appetite, or other constitutional symptoms   Saw [facility] with fatigue, achy all over, tested for covid and flu, and found covid+, symptoms started last wed, started doxycyline  sun aug 25, not treated with paxlovid,   Feeling better overall.  Asks for flu shot.  Still smoking, not ready to quit.  Pt continues to have recurring LBP without change in severity, bowel or bladder change, fever, wt loss,  worsening LE pain/numbness/weakness, gait change or falls.  Wt Readings from Last 3 Encounters:  10/23/22 190 lb (86.2 kg)  04/24/22 192 lb (87.1 kg)  02/27/22 195 lb (88.5 kg)   BP Readings from Last 3 Encounters:  10/23/22 136/80  04/24/22 (!) 150/90  02/27/22 (!) 145/71         Past Medical History:  Diagnosis Date   Anxiety    Atherosclerosis of abdominal aorta (HCC) 11/12/2011   BPH (benign prostatic hyperplasia)    Bronchiectasis (HCC) 03/26/2018   Cancer of prostate (HCC)    Chronic anticoagulation 09/12/2016   Coronary artery calcification seen on CT scan 03/26/2018   Diverticulosis of colon    DJD (degenerative joint disease)    left foot, "pt denies"   Glucose intolerance (impaired glucose tolerance) 11/03/2010   History of colon polyps    History of DVT (deep vein thrombosis)    History of pulmonary embolism    HTN (hypertension)    Hypercholesteremia    Lumbar degenerative disc disease 11/12/2011   Plantar fasciitis    Sleep apnea    Past Surgical History:  Procedure Laterality Date    INSERTION PROSTATE RADIATION SEED     UVULECTOMY      reports that he has been smoking cigarettes. He started smoking about 53 years ago. He has never used smokeless tobacco. He reports that he does not drink alcohol and does not use drugs. family history includes Deep vein thrombosis in his mother; Diabetes in his mother; Healthy in his brother and brother. Allergies  Allergen Reactions   Pravastatin Other (See Comments)    myalgia   Current Outpatient Medications on File Prior to Visit  Medication Sig Dispense Refill   b complex vitamins tablet Take 1 tablet by mouth daily.     Cholecalciferol (VITAMIN D3) 1000 UNITS CAPS Take 1 Int'l Units by mouth daily.     losartan (COZAAR) 25 MG tablet Take 1 tablet by mouth twice daily 180 tablet 3   simvastatin (ZOCOR) 20 MG tablet TAKE 1 TABLET BY MOUTH AT BEDTIME 90 tablet 0   XARELTO 20 MG TABS tablet TAKE 1 TABLET BY MOUTH ONCE DAILY WITH SUPPER 90 tablet 0   No current facility-administered medications on file prior to visit.        ROS:  All others reviewed and negative.  Objective        PE:  BP 136/80 (BP Location: Right Arm, Patient Position: Sitting, Cuff Size: Large)  Pulse 77   Temp 98 F (36.7 C) (Oral)   Ht 5\' 9"  (1.753 m)   Wt 190 lb (86.2 kg)   SpO2 96%   BMI 28.06 kg/m                 Constitutional: Pt appears in NAD               HENT: Head: NCAT.                Right Ear: External ear normal.                 Left Ear: External ear normal.                Eyes: . Pupils are equal, round, and reactive to light. Conjunctivae and EOM are normal               Nose: without d/c or deformity               Neck: Neck supple. Gross normal ROM               Cardiovascular: Normal rate and regular rhythm.                 Pulmonary/Chest: Effort normal and breath sounds without rales or wheezing.                Abd:  Soft, NT, ND, + BS, no organomegaly               Neurological: Pt is alert. At baseline orientation, motor  grossly intact               Skin: Skin is warm. No rashes, no other new lesions, LE edema - none               Psychiatric: Pt behavior is normal without agitation   Micro: none  Cardiac tracings I have personally interpreted today:  none  Pertinent Radiological findings (summarize): none   Lab Results  Component Value Date   WBC 6.6 10/23/2022   HGB 15.4 10/23/2022   HCT 49.1 10/23/2022   PLT 176.0 10/23/2022   GLUCOSE 99 10/23/2022   CHOL 105 10/23/2022   TRIG 102.0 10/23/2022   HDL 40.60 10/23/2022   LDLCALC 44 10/23/2022   ALT 32 10/23/2022   AST 35 10/23/2022   NA 139 10/23/2022   K 3.9 10/23/2022   CL 102 10/23/2022   CREATININE 1.16 10/23/2022   BUN 17 10/23/2022   CO2 29 10/23/2022   TSH 0.66 04/24/2022   PSA 0.00 (L) 10/04/2021   INR 2.1 05/28/2017   HGBA1C 5.9 10/23/2022   Assessment/Plan:  DECKARD GOHN is a 82 y.o. Black or African American [2] male with  has a past medical history of Anxiety, Atherosclerosis of abdominal aorta (HCC) (11/12/2011), BPH (benign prostatic hyperplasia), Bronchiectasis (HCC) (03/26/2018), Cancer of prostate (HCC), Chronic anticoagulation (09/12/2016), Coronary artery calcification seen on CT scan (03/26/2018), Diverticulosis of colon, DJD (degenerative joint disease), Glucose intolerance (impaired glucose tolerance) (11/03/2010), History of colon polyps, History of DVT (deep vein thrombosis), History of pulmonary embolism, HTN (hypertension), Hypercholesteremia, Lumbar degenerative disc disease (11/12/2011), Plantar fasciitis, and Sleep apnea.  HTN (hypertension) BP Readings from Last 3 Encounters:  10/23/22 136/80  04/24/22 (!) 150/90  02/27/22 (!) 145/71   Stable, pt to continue medical treatment losartan 25 mg qd   Hypercholesteremia Lab Results  Component Value Date   LDLCALC 44 10/23/2022  Stable, pt to continue current statin zocor 20 mg qd   Impaired glucose tolerance Lab Results  Component Value Date   HGBA1C  5.9 10/23/2022   Stable, pt to continue current medical treatment  - diet,wt control   Lumbar degenerative disc disease Chronic with mild recent worsening subjectively, pt plans to f/u with sports medicine  Tobacco abuse Pt counseled to quit, pt not rady  Followup: Return in about 6 months (around 04/25/2023).  Oliver Barre, MD 10/24/2022 7:28 PM [facility]

## 2022-10-23 NOTE — Patient Instructions (Addendum)
Your Covid infection appears to be improved  Please see Sports Medicine on the first floor for the back pain  You had the flu shot today  Please continue all other medications as before, and refills have been done if requested.  Please have the pharmacy call with any other refills you may need..  Please keep your appointments with your specialists as you may have planned  Please go to the LAB at the blood drawing area for the tests to be done  You will be contacted by phone if any changes need to be made immediately.  Otherwise, you will receive a letter about your results with an explanation, but please check with MyChart first.  Please make an Appointment to return in 6 months, or sooner if needed

## 2022-10-24 ENCOUNTER — Encounter: Payer: Self-pay | Admitting: Internal Medicine

## 2022-10-24 NOTE — Assessment & Plan Note (Signed)
Lab Results  Component Value Date   LDLCALC 44 10/23/2022   Stable, pt to continue current statin zocor 20 mg qd

## 2022-10-24 NOTE — Assessment & Plan Note (Signed)
Pt counseled to quit, pt not rady

## 2022-10-24 NOTE — Assessment & Plan Note (Signed)
Chronic with mild recent worsening subjectively, pt plans to f/u with sports medicine

## 2022-10-24 NOTE — Assessment & Plan Note (Signed)
BP Readings from Last 3 Encounters:  10/23/22 136/80  04/24/22 (!) 150/90  02/27/22 (!) 145/71   Stable, pt to continue medical treatment losartan 25 mg qd

## 2022-10-24 NOTE — Assessment & Plan Note (Signed)
Lab Results  Component Value Date   HGBA1C 5.9 10/23/2022   Stable, pt to continue current medical treatment  - diet,wt control

## 2022-10-30 NOTE — Progress Notes (Signed)
    Kevin Hale D.[facility] [address] [place] [address] Phone: [phone]   Assessment and Plan:     1. Neck pain 2. DDD (degenerative disc disease), cervical  -Chronic with exacerbation, initial sports medicine visit - Patient experiencing limited extension of neck and pain with extension of neck likely caused by degenerative changes most severe at C5-C6 - Start HEP for neck - May use heating pads or on a warm shower over neck and shoulders to decrease pain and relax musculature - May use Tylenol as needed for day-to-day pain relief  15 additional minutes spent for educating Therapeutic Home Exercise Program.  This included exercises focusing on stretching, strengthening, with focus on eccentric aspects.   Long term goals include an improvement in range of motion, strength, endurance as well as avoiding reinjury. Patient's frequency would include in 1-2 times a day, 3-5 times a week for a duration of 6-12 weeks. Proper technique shown and discussed handout in great detail with ATC.  All questions were discussed and answered.    Pertinent previous records reviewed include C-spine x-ray 09/24/2018, thoracic spine x-ray 01/16/2020, CT head 02/27/2022, CT abdomen pelvis 03/26/2022   Follow Up: 4 weeks for reevaluation.  If stable, could continue conservative treatment plan.  If worsening symptoms, could consider physical therapy versus medication course   Subjective:   I, Kevin Hale, am serving as a Neurosurgeon for Doctor Richardean Sale  Chief Complaint: neck pain   HPI:   10/31/2022 Patient is a 82 year old male complaining of neck pain. Patient states that he has decreased ROM . Pain for a while. No meds for the pain. No MOI. No radiating pain    Relevant Historical Information: Hypertension, history of DVT, chronically anticoagulated on Xarelto  Additional pertinent review of systems negative.   Current Outpatient Medications:    b  complex vitamins tablet, Take 1 tablet by mouth daily., Disp: , Rfl:    Cholecalciferol (VITAMIN D3) 1000 UNITS CAPS, Take 1 Int'l Units by mouth daily., Disp: , Rfl:    losartan (COZAAR) 25 MG tablet, Take 1 tablet by mouth twice daily, Disp: 180 tablet, Rfl: 3   simvastatin (ZOCOR) 20 MG tablet, TAKE 1 TABLET BY MOUTH AT BEDTIME, Disp: 90 tablet, Rfl: 0   XARELTO 20 MG TABS tablet, TAKE 1 TABLET BY MOUTH ONCE DAILY WITH SUPPER, Disp: 90 tablet, Rfl: 0   Objective:     Vitals:   10/31/22 1011  BP: 138/88  Pulse: 86  SpO2: 98%  Weight: 190 lb (86.2 kg)  Height: 5\' 9"  (1.753 m)      Body mass index is 28.06 kg/m.    Physical Exam:    Neck Exam: Cervical Spine- Posture normal Skin- normal, intact  Neuro:  Strength-  Right Left   Deltoid (C5) 5/5 5/5  Bicep/Brachioradialis (C5/6) 5/5  5/5  Wrist Extension (C6) 5/5 5/5  Tricep (C7) 5/5 5/5  Wrist Flexion (C7) 5/5 5/5  Grip (C8) 5/5 5/5  Finger Abduction (T1) 5/5 5/5   Sensation: intact to light touch in upper extremities bilaterally  Spurling's:  negative bilaterally Neck ROM: Moderately decreased C-spine extension, sidebending   NTTP: cervical spinous processes, cervical paraspinal, thoracic paraspinal, trapezius    Electronically signed by:  Kevin Hale D.[facility] 10:32 AM 10/31/22

## 2022-10-31 ENCOUNTER — Ambulatory Visit: Payer: Medicare Other | Admitting: Sports Medicine

## 2022-10-31 VITALS — BP 138/88 | HR 86 | Ht 69.0 in | Wt 190.0 lb

## 2022-10-31 DIAGNOSIS — M5030 Other cervical disc degeneration, unspecified cervical region: Secondary | ICD-10-CM | POA: Diagnosis not present

## 2022-10-31 DIAGNOSIS — M542 Cervicalgia: Secondary | ICD-10-CM

## 2022-10-31 NOTE — Patient Instructions (Signed)
Neck HEP  Tylenol as needed Use heating pad over neck  4 week follow up

## 2022-11-09 ENCOUNTER — Other Ambulatory Visit (HOSPITAL_BASED_OUTPATIENT_CLINIC_OR_DEPARTMENT_OTHER): Payer: Self-pay

## 2022-11-09 MED ORDER — COVID-19 MRNA VAC-TRIS(PFIZER) 30 MCG/0.3ML IM SUSY
0.3000 mL | PREFILLED_SYRINGE | Freq: Once | INTRAMUSCULAR | 0 refills | Status: AC
  Filled 2022-11-09: qty 0.3, 1d supply, fill #0

## 2022-11-12 ENCOUNTER — Ambulatory Visit (INDEPENDENT_AMBULATORY_CARE_PROVIDER_SITE_OTHER): Payer: Medicare Other

## 2022-11-12 VITALS — Ht 69.0 in | Wt 191.0 lb

## 2022-11-12 DIAGNOSIS — Z0000 Encounter for general adult medical examination without abnormal findings: Secondary | ICD-10-CM | POA: Diagnosis not present

## 2022-11-12 NOTE — Progress Notes (Deleted)
Subjective:   Kevin Hale is a 82 y.o. male who presents for Medicare Annual/Subsequent preventive examination.  Visit Complete: {VISITMETHOD:[phone]}  Patient Medicare AWV questionnaire was completed by the patient on ***; I have confirmed that all information answered by patient is correct and no changes since this date.        Objective:    There were no vitals filed for this visit. There is no height or weight on file to calculate BMI.     02/27/2022    2:27 PM 12/12/2021    1:52 PM 09/15/2021    9:11 AM 09/09/2020   10:45 AM 04/09/2020    9:05 PM 11/26/2016    1:39 PM 11/16/2016   10:33 AM  Advanced Directives  Does Patient Have a Medical Advance Directive? No No Yes Yes No Yes Yes  Type of Surveyor, minerals;Living will Living will  Healthcare Power of [employer] Power of [place];Living will  Does patient want to make changes to medical advance directive?    No - Patient declined     Copy of Healthcare Power of Attorney in Chart?   No - copy requested   No - copy requested No - copy requested  Would patient like information on creating a medical advance directive?  No - Patient declined   No - Patient declined      Current Medications (verified) Outpatient Encounter Medications as of 11/12/2022  Medication Sig   b complex vitamins tablet Take 1 tablet by mouth daily.   Cholecalciferol (VITAMIN D3) 1000 UNITS CAPS Take 1 Int'l Units by mouth daily.   losartan (COZAAR) 25 MG tablet Take 1 tablet by mouth twice daily   simvastatin (ZOCOR) 20 MG tablet TAKE 1 TABLET BY MOUTH AT BEDTIME   XARELTO 20 MG TABS tablet TAKE 1 TABLET BY MOUTH ONCE DAILY WITH SUPPER   No facility-administered encounter medications on file as of 11/12/2022.    Allergies (verified) Pravastatin   History: Past Medical History:  Diagnosis Date   Anxiety    Atherosclerosis of abdominal aorta (HCC) 11/12/2011   BPH (benign prostatic hyperplasia)     Bronchiectasis (HCC) 03/26/2018   Cancer of prostate (HCC)    Chronic anticoagulation 09/12/2016   Coronary artery calcification seen on CT scan 03/26/2018   Diverticulosis of colon    DJD (degenerative joint disease)    left foot, "pt denies"   Glucose intolerance (impaired glucose tolerance) 11/03/2010   History of colon polyps    History of DVT (deep vein thrombosis)    History of pulmonary embolism    HTN (hypertension)    Hypercholesteremia    Lumbar degenerative disc disease 11/12/2011   Plantar fasciitis    Sleep apnea    Past Surgical History:  Procedure Laterality Date   INSERTION PROSTATE RADIATION SEED     UVULECTOMY     Family History  Problem Relation Age of Onset   Deep vein thrombosis Mother    Diabetes Mother    Healthy Brother    Healthy Brother    Colon cancer Neg Hx    Rectal cancer Neg Hx    Social History   Socioeconomic History   Marital status: Married    Spouse name: Not on file   Number of children: 2   Years of education: Not on file   Highest education level: Not on file  Occupational History   Occupation: retired  Tobacco Use   Smoking status: Some Days  Types: Cigarettes    Start date: 02/26/1969   Smokeless tobacco: Never  Vaping Use   Vaping status: Never Used  Substance and Sexual Activity   Alcohol use: No   Drug use: No   Sexual activity: Not Currently  Other Topics Concern   Not on file  Social History Narrative   Not on file   Social Determinants of Health   Financial Resource Strain: Low Risk  (09/15/2021)   Overall Financial Resource Strain (CARDIA)    Difficulty of Paying Living Expenses: Not hard at all  Food Insecurity: No Food Insecurity (09/15/2021)   Hunger Vital Sign    Worried About Running Out of Food in the Last Year: Never true    Ran Out of Food in the Last Year: Never true  Transportation Needs: No Transportation Needs (09/15/2021)   PRAPARE - Administrator, Civil Service (Medical): No     Lack of Transportation (Non-Medical): No  Physical Activity: Insufficiently Active (09/15/2021)   Exercise Vital Sign    Days of Exercise per Week: 3 days    Minutes of Exercise per Session: 30 min  Stress: No Stress Concern Present (09/15/2021)   [employer] of Occupational Health - Occupational Stress Questionnaire    Feeling of Stress : Not at all  Social Connections: Moderately Isolated (09/15/2021)   Social Connection and Isolation Panel [NHANES]    Frequency of Communication with Friends and Family: Three times a week    Frequency of Social Gatherings with Friends and Family: Three times a week    Attends Religious Services: Never    Active Member of Clubs or Organizations: No    Attends Banker Meetings: Never    Marital Status: Married    Tobacco Counseling Ready to quit: Not Answered Counseling given: Not Answered   Clinical Intake:                        Activities of Daily Living     No data to display          Patient Care Team: Corwin Levins, MD as PCP - General Pricilla Riffle, MD as PCP - Cardiology (Cardiology) Hilarie Fredrickson, MD as Consulting Physician (Gastroenterology) Charlsie Merles Kirstie Peri, DPM as Consulting Physician (Podiatry) Judi Saa, DO as Consulting Physician (Family Medicine) [place], [place] Lowella Bandy., MD as Attending Physician (Urology) Lennette Bihari, MD as Consulting Physician (Cardiology) Genia Del Daisy Blossom, MD as Consulting Physician (Ophthalmology)  Indicate any recent Medical Services you may have received from other than [facility] providers in the past year (date may be approximate).     Assessment:   This is a routine wellness examination for Kevin Hale.  Hearing/Vision screen No results found.   Goals Addressed   None    Depression Screen    04/24/2022    9:12 AM 11/03/2021   10:58 AM 10/04/2021   11:20 AM 09/15/2021    9:13 AM 09/15/2021    9:10 AM 04/06/2021    9:49 AM 04/06/2021    9:16 AM  PHQ 2/9  Scores  PHQ - 2 Score 0  0 0 0 0 0  PHQ- 9 Score 0  0    0  Exception Documentation  Patient refusal         Fall Risk    04/24/2022    9:12 AM 11/03/2021   10:58 AM 10/04/2021   11:20 AM 09/15/2021    9:13 AM 04/06/2021  9:49 AM  Fall Risk   Falls in the past year? 0 0 0 0 0  Number falls in past yr: 0  0 0 0  Injury with Fall? 0 0 0 0 0  Risk for fall due to : No Fall Risks No Fall Risks No Fall Risks    Follow up Falls evaluation completed Falls evaluation completed Falls evaluation completed Falls evaluation completed;Education provided     MEDICARE RISK AT HOME:    TIMED UP AND GO:  Was the test performed?  {[phone]}    Cognitive Function:    11/16/2016   10:31 AM  MMSE - Mini Mental State Exam  Orientation to time 5  Orientation to Place 5  Registration 3  Attention/ Calculation 4  Recall 0  Language- name 2 objects 2  Language- repeat 1  Language- follow 3 step command 3  Language- read & follow direction 1  Write a sentence 1  Copy design 1  Total score 26        Immunizations Immunization History  Administered Date(s) Administered   COVID-19, mRNA, vaccine(Comirnaty)12 years and older 11/09/2022   Fluad Quad(high Dose 65+) 10/30/2018, 11/15/2020, 11/03/2021   Influenza Whole 11/28/2009, 11/06/2010, 11/10/2011   Influenza, High Dose Seasonal PF 11/27/2014, 12/31/2014, 10/17/2016, 10/26/2017   Influenza, Seasonal, Injecte, Preservative Fre 10/23/2022   Influenza,inj,Quad PF,6+ Mos 11/11/2012, 12/22/2015, 12/18/2019   Influenza-Unspecified 12/04/2002, 11/27/2003, 12/28/2003, 10/23/2010, 12/28/2011, 10/27/2013, 11/30/2014, 10/28/2018   Moderna Covid-19 Vaccine Bivalent Booster 5yrs & up 11/24/2020   Moderna Sars-Covid-2 Vaccination 04/11/2019, 05/09/2019, 01/04/2020, 06/27/2020   Pneumococcal Conjugate-13 11/11/2012, 06/15/2014   Pneumococcal Polysaccharide-23 02/28/2004, 11/13/2012   Pneumococcal-Unspecified 01/05/2004, 12/28/2010   Td  02/27/2000   Td (Adult),unspecified 05/09/2012   Tdap 06/15/2014   Tetanus 05/09/2012   Zoster Recombinant(Shingrix) 01/29/2018, 05/07/2018   Zoster, Live 05/19/2013    {TDAP status:2101805}  {Flu Vaccine status:2101806}  {Pneumococcal vaccine status:2101807}  {Covid-19 vaccine status:2101808}  Qualifies for Shingles Vaccine? {YES/NO:21197}  Zostavax completed {YES/NO:21197}  {Shingrix Completed?:2101804}  Screening Tests Health Maintenance  Topic Date Due   Medicare Annual Wellness (AWV)  09/16/2022   COVID-19 Vaccine (7 - 2023-24 season) 01/04/2023   DTaP/Tdap/Td (5 - Td or Tdap) 06/14/2024   Pneumonia Vaccine 31+ Years old  Completed   INFLUENZA VACCINE  Completed   Zoster Vaccines- Shingrix  Completed   HPV VACCINES  Aged Out   Colonoscopy  Discontinued    Health Maintenance  Health Maintenance Due  Topic Date Due   Medicare Annual Wellness (AWV)  09/16/2022    {Colorectal cancer screening:2101809}  Lung Cancer Screening: (Low Dose CT Chest recommended if Age 46-80 years, 20 pack-year currently smoking OR have quit w/in 15years.) {DOES NOT does:27190::"does not"} qualify.   Lung Cancer Screening Referral: ***  Additional Screening:  Hepatitis C Screening: {DOES NOT does:27190::"does not"} qualify; Completed ***  Vision Screening: Recommended annual ophthalmology exams for early detection of glaucoma and other disorders of the eye. Is the patient up to date with their annual eye exam?  {YES/NO:21197} Who is the provider or what is the name of the office in which the patient attends annual eye exams? *** If pt is not established with a provider, would they like to be referred to a provider to establish care? {YES/NO:21197}.   Dental Screening: Recommended annual dental exams for proper oral hygiene  Diabetic Foot Exam: {Diabetic Foot Exam:2101802}  Community Resource Referral / Chronic Care Management: CRR required this visit?  {YES/NO:21197}  CCM  required this visit?  {  CCM Required [phone]}     Plan:     I have personally reviewed and noted the following in the patient's chart:   Medical and social history Use of alcohol, tobacco or illicit drugs  Current medications and supplements including opioid prescriptions. {Opioid Prescriptions:[phone]} Functional ability and status Nutritional status Physical activity Advanced directives List of other physicians Hospitalizations, surgeries, and ER visits in previous 12 months Vitals Screenings to include cognitive, depression, and falls Referrals and appointments  In addition, I have reviewed and discussed with patient certain preventive protocols, quality metrics, and best practice recommendations. A written personalized care plan for preventive services as well as general preventive health recommendations were provided to patient.     Marinus Maw, CMA   11/12/2022   After Visit Summary: {CHL AMB AWV After Visit Summary:[id number]}  Nurse Notes: ***

## 2022-11-12 NOTE — Patient Instructions (Signed)
  Kevin Hale , Thank you for taking time to come for your Medicare Wellness Visit. I appreciate your ongoing commitment to your health goals. Please review the following plan we discussed and let me know if I can assist you in the future.   These are the goals we discussed:  Goals       Patient Stated (pt-stated)      My goal is to keep my weight around 195 pounds.  Continue to active and independent as much as possible.      Patient Stated (pt-stated)      He dose not currently have any health goals      Stay active as possible and go fishing as much as possible      Enjoy life and family        This is a list of the screening recommended for you and due dates:  Health Maintenance  Topic Date Due   COVID-19 Vaccine (7 - 2023-24 season) 01/04/2023   Medicare Annual Wellness Visit  11/12/2023   DTaP/Tdap/Td vaccine (5 - Td or Tdap) 06/14/2024   Pneumonia Vaccine  Completed   Flu Shot  Completed   Zoster (Shingles) Vaccine  Completed   HPV Vaccine  Aged Out   Colon Cancer Screening  Discontinued

## 2022-11-12 NOTE — Progress Notes (Addendum)
Subjective:   Kevin Hale is a 82 y.o. male who presents for Medicare Annual/Subsequent preventive examination.  Visit Complete: Virtual  I connected with  Loran Senters on 11/12/22 by a audio enabled telemedicine application and verified that I am speaking with the correct person using two identifiers.  Patient Location: Home  Provider Location: Office/Clinic  I discussed the limitations of evaluation and management by telemedicine. The patient expressed understanding and agreed to proceed.   Cardiac Risk Factors include: advanced age (>45men, >46 women);hypertension;male gender    Patient was unable to self-report due to a lack of equipment at home via telehealth Objective:    Today's Vitals   11/12/22 0905 11/12/22 0906  Weight: 191 lb (86.6 kg)   Height: 5\' 9"  (1.753 m)   PainSc:  3    Body mass index is 28.21 kg/m.     11/12/2022    9:21 AM 02/27/2022    2:27 PM 12/12/2021    1:52 PM 09/15/2021    9:11 AM 09/09/2020   10:45 AM 04/09/2020    9:05 PM 11/26/2016    1:39 PM  Advanced Directives  Does Patient Have a Medical Advance Directive? Yes No No Yes Yes No Yes  Type of Advance Directive Living will   Healthcare Power of [place];Living will Living will  Healthcare Power of Attorney  Does patient want to make changes to medical advance directive? No - Patient declined    No - Patient declined    Copy of Healthcare Power of Attorney in Chart?    No - copy requested   No - copy requested  Would patient like information on creating a medical advance directive?   No - Patient declined   No - Patient declined     Current Medications (verified) Outpatient Encounter Medications as of 11/12/2022  Medication Sig   b complex vitamins tablet Take 1 tablet by mouth daily.   Cholecalciferol (VITAMIN D3) 1000 UNITS CAPS Take 1 Int'l Units by mouth daily.   losartan (COZAAR) 25 MG tablet Take 1 tablet by mouth twice daily   simvastatin (ZOCOR) 20 MG tablet TAKE 1 TABLET BY  MOUTH AT BEDTIME   XARELTO 20 MG TABS tablet TAKE 1 TABLET BY MOUTH ONCE DAILY WITH SUPPER   No facility-administered encounter medications on file as of 11/12/2022.    Allergies (verified) Pravastatin   History: Past Medical History:  Diagnosis Date   Anxiety    Atherosclerosis of abdominal aorta (HCC) 11/12/2011   BPH (benign prostatic hyperplasia)    Bronchiectasis (HCC) 03/26/2018   Cancer of prostate (HCC)    Chronic anticoagulation 09/12/2016   Coronary artery calcification seen on CT scan 03/26/2018   Diverticulosis of colon    DJD (degenerative joint disease)    left foot, "pt denies"   Glucose intolerance (impaired glucose tolerance) 11/03/2010   History of colon polyps    History of DVT (deep vein thrombosis)    History of pulmonary embolism    HTN (hypertension)    Hypercholesteremia    Lumbar degenerative disc disease 11/12/2011   Plantar fasciitis    Sleep apnea    Past Surgical History:  Procedure Laterality Date   INSERTION PROSTATE RADIATION SEED     UVULECTOMY     Family History  Problem Relation Age of Onset   Deep vein thrombosis Mother    Diabetes Mother    Healthy Brother    Healthy Brother    Colon cancer Neg Hx  Rectal cancer Neg Hx    Social History   Socioeconomic History   Marital status: Married    Spouse name: Not on file   Number of children: 2   Years of education: Not on file   Highest education level: Not on file  Occupational History   Occupation: retired  Tobacco Use   Smoking status: Some Days    Current packs/day: 0.50    Types: Cigarettes   Smokeless tobacco: Never  Vaping Use   Vaping status: Never Used  Substance and Sexual Activity   Alcohol use: No   Drug use: No   Sexual activity: Not Currently  Other Topics Concern   Not on file  Social History Narrative   Not on file   Social Determinants of Health   Financial Resource Strain: Low Risk  (11/12/2022)   Overall Financial Resource Strain (CARDIA)     Difficulty of Paying Living Expenses: Not hard at all  Food Insecurity: No Food Insecurity (11/12/2022)   Hunger Vital Sign    Worried About Running Out of Food in the Last Year: Never true    Ran Out of Food in the Last Year: Never true  Transportation Needs: No Transportation Needs (11/12/2022)   PRAPARE - Administrator, Civil Service (Medical): No    Lack of Transportation (Non-Medical): No  Physical Activity: Inactive (11/12/2022)   Exercise Vital Sign    Days of Exercise per Week: 0 days    Minutes of Exercise per Session: 0 min  Stress: No Stress Concern Present (11/12/2022)   [employer] of Occupational Health - Occupational Stress Questionnaire    Feeling of Stress : Not at all  Social Connections: Socially Isolated (11/12/2022)   Social Connection and Isolation Panel [NHANES]    Frequency of Communication with Friends and Family: Once a week    Frequency of Social Gatherings with Friends and Family: Once a week    Attends Religious Services: Never    Database administrator or Organizations: No    Attends Engineer, structural: Never    Marital Status: Married    Tobacco Counseling Ready to quit: Not Answered Counseling given: Not Answered   Clinical Intake:  Pre-visit preparation completed: Yes  Pain : 0-10 Pain Score: 3  Pain Type: Other (Comment) Pain Location: Other (Comment) (all over the body) Pain Onset: 1 to 4 weeks ago Pain Frequency: Once a week     BMI - recorded: 28.05 Nutritional Status: BMI 25 -29 Overweight Nutritional Risks: None Diabetes: No  How often do you need to have someone help you when you read instructions, pamphlets, or other written materials from your doctor or pharmacy?: 1 - Never What is the last grade level you completed in school?: 4 years, bachelor's degree  Interpreter Needed?: No      Activities of Daily Living    11/12/2022    9:10 AM  In your present state of health, do you have any  difficulty performing the following activities:  Hearing? 0  Comment have hearing aid  Vision? 0  Comment have prescribe glasses  Difficulty concentrating or making decisions? 0  Walking or climbing stairs? 0  Dressing or bathing? 0  Doing errands, shopping? 0  Preparing Food and eating ? N  Using the Toilet? N  In the past six months, have you accidently leaked urine? Y  Comment somethimes  Do you have problems with loss of bowel control? N  Managing your Medications? N  Managing your Finances? N  Housekeeping or managing your Housekeeping? N    Patient Care Team: Corwin Levins, MD as PCP - Vita Erm, MD as PCP - Cardiology (Cardiology) Hilarie Fredrickson, MD as Consulting Physician (Gastroenterology) Charlsie Merles Kirstie Peri, DPM as Consulting Physician (Podiatry) Judi Saa, DO as Consulting Physician (Family Medicine) [place], [place] Lowella Bandy., MD as Attending Physician (Urology) Lennette Bihari, MD as Consulting Physician (Cardiology) Genia Del Daisy Blossom, MD as Consulting Physician (Ophthalmology)  Indicate any recent Medical Services you may have received from other than [facility] providers in the past year (date may be approximate).     Assessment:   This is a routine wellness examination for Rommell.  Hearing/Vision screen Vision Screening - Comments:: See eye provider yearly    Goals Addressed               This Visit's Progress     Patient Stated (pt-stated)        He dose not currently have any health goals      Depression Screen    11/12/2022    9:19 AM 04/24/2022    9:12 AM 11/03/2021   10:58 AM 10/04/2021   11:20 AM 09/15/2021    9:13 AM 09/15/2021    9:10 AM 04/06/2021    9:49 AM  PHQ 2/9 Scores  PHQ - 2 Score 0 0  0 0 0 0  PHQ- 9 Score  0  0     Exception Documentation   Patient refusal        Fall Risk    11/12/2022    9:24 AM 04/24/2022    9:12 AM 11/03/2021   10:58 AM 10/04/2021   11:20 AM 09/15/2021    9:13 AM  Fall Risk   Falls in the past  year? 0 0 0 0 0  Number falls in past yr: 0 0  0 0  Injury with Fall? 0 0 0 0 0  Risk for fall due to : No Fall Risks No Fall Risks No Fall Risks No Fall Risks   Follow up Falls evaluation completed Falls evaluation completed Falls evaluation completed Falls evaluation completed Falls evaluation completed;Education provided    MEDICARE RISK AT HOME: Medicare Risk at Home Any stairs in or around the home?: Yes If so, are there any without handrails?: No Home free of loose throw rugs in walkways, pet beds, electrical cords, etc?: Yes Adequate lighting in your home to reduce risk of falls?: Yes Life alert?: No Use of a cane, walker or w/c?: No Grab bars in the bathroom?: Yes Shower chair or bench in shower?: Yes Elevated toilet seat or a handicapped toilet?: No  TIMED UP AND GO:  Was the test performed?  No    Cognitive Function:    11/16/2016   10:31 AM  MMSE - Mini Mental State Exam  Orientation to time 5  Orientation to Place 5  Registration 3  Attention/ Calculation 4  Recall 0  Language- name 2 objects 2  Language- repeat 1  Language- follow 3 step command 3  Language- read & follow direction 1  Write a sentence 1  Copy design 1  Total score 26        11/12/2022    9:25 AM  6CIT Screen  What Year? 0 points  What month? 0 points  What time? 0 points  Count back from 20 0 points  Months in reverse 2 points  Repeat phrase 2 points  Total  Score 4 points    Immunizations Immunization History  Administered Date(s) Administered   COVID-19, mRNA, vaccine(Comirnaty)12 years and older 11/09/2022   Fluad Quad(high Dose 65+) 10/30/2018, 11/15/2020, 11/03/2021   Influenza Whole 11/28/2009, 11/06/2010, 11/10/2011   Influenza, High Dose Seasonal PF 11/27/2014, 12/31/2014, 10/17/2016, 10/26/2017   Influenza, Seasonal, Injecte, Preservative Fre 10/23/2022   Influenza,inj,Quad PF,6+ Mos 11/11/2012, 12/22/2015, 12/18/2019   Influenza-Unspecified 12/04/2002, 11/27/2003,  12/28/2003, 10/23/2010, 12/28/2011, 10/27/2013, 11/30/2014, 10/28/2018   Moderna Covid-19 Vaccine Bivalent Booster 86yrs & up 11/24/2020   Moderna Sars-Covid-2 Vaccination 04/11/2019, 05/09/2019, 01/04/2020, 06/27/2020   Pneumococcal Conjugate-13 11/11/2012, 06/15/2014   Pneumococcal Polysaccharide-23 02/28/2004, 11/13/2012   Pneumococcal-Unspecified 01/05/2004, 12/28/2010   Td 02/27/2000   Td (Adult),unspecified 05/09/2012   Tdap 06/15/2014   Tetanus 05/09/2012   Zoster Recombinant(Shingrix) 01/29/2018, 05/07/2018   Zoster, Live 05/19/2013    TDAP status: Up to date  Flu Vaccine status: Up to date  Pneumococcal vaccine status: Up to date  Covid-19 vaccine status: Completed vaccines  Qualifies for Shingles Vaccine? Yes   Zostavax completed Yes   Shingrix Completed?: Yes  Screening Tests Health Maintenance  Topic Date Due   COVID-19 Vaccine (7 - 2023-24 season) 01/04/2023   Medicare Annual Wellness (AWV)  11/12/2023   DTaP/Tdap/Td (5 - Td or Tdap) 06/14/2024   Pneumonia Vaccine 15+ Years old  Completed   INFLUENZA VACCINE  Completed   Zoster Vaccines- Shingrix  Completed   HPV VACCINES  Aged Out   Colonoscopy  Discontinued    Health Maintenance  There are no preventive care reminders to display for this patient.  Colorectal cancer screening: Type of screening: Colonoscopy. Completed 11/28/22. Repeat every N/A years  Lung Cancer Screening: (Low Dose CT Chest recommended if Age 23-80 years, 20 pack-year currently smoking OR have quit w/in 15years.) does qualify.   Lung Cancer Screening Referral: N/A  Additional Screening:  Hepatitis C Screening: does not qualify; Completed N/A  Vision Screening: Recommended annual ophthalmology exams for early detection of glaucoma and other disorders of the eye. Is the patient up to date with their annual eye exam?  Yes  Who is the provider or what is the name of the office in which the patient attends annual eye exams? Dr.  Genia Del If pt is not established with a provider, would they like to be referred to a provider to establish care? No .   Dental Screening: Recommended annual dental exams for proper oral hygiene  Community Resource Referral / Chronic Care Management: CRR required this visit?  No   CCM required this visit?  No     Plan:     I have personally reviewed and noted the following in the patient's chart:   Medical and social history Use of alcohol, tobacco or illicit drugs  Current medications and supplements including opioid prescriptions. Patient is not currently taking opioid prescriptions. Functional ability and status Nutritional status Physical activity Advanced directives List of other physicians Hospitalizations, surgeries, and ER visits in previous 12 months Vitals Screenings to include cognitive, depression, and falls Referrals and appointments  In addition, I have reviewed and discussed with patient certain preventive protocols, quality metrics, and best practice recommendations. A written personalized care plan for preventive services as well as general preventive health recommendations were provided to patient.     Young Berry Ilee Randleman, CMA   11/12/2022   After Visit Summary: (Mail) Due to this being a telephonic visit, the after visit summary with patients personalized plan was offered to patient via mail  Nurse Notes: I connected with  Loran Senters on 11/12/22 by a audio enabled telemedicine application and verified that I am speaking with the correct person using two identifiers.  Patient Location: Home  Provider Location: Office/Clinic  I discussed the limitations of evaluation and management by telemedicine. The patient expressed understanding and agreed to proceed.

## 2022-11-28 ENCOUNTER — Ambulatory Visit: Payer: Medicare Other | Admitting: Sports Medicine

## 2022-12-03 DIAGNOSIS — M542 Cervicalgia: Secondary | ICD-10-CM | POA: Diagnosis not present

## 2022-12-03 DIAGNOSIS — J40 Bronchitis, not specified as acute or chronic: Secondary | ICD-10-CM | POA: Diagnosis not present

## 2022-12-03 DIAGNOSIS — R059 Cough, unspecified: Secondary | ICD-10-CM | POA: Diagnosis not present

## 2022-12-03 DIAGNOSIS — I10 Essential (primary) hypertension: Secondary | ICD-10-CM | POA: Diagnosis not present

## 2022-12-05 ENCOUNTER — Encounter: Payer: Self-pay | Admitting: Internal Medicine

## 2022-12-05 ENCOUNTER — Ambulatory Visit: Payer: Medicare Other | Admitting: Internal Medicine

## 2022-12-05 VITALS — BP 126/78 | HR 87 | Temp 98.2°F | Ht 69.0 in | Wt 193.8 lb

## 2022-12-05 DIAGNOSIS — I10 Essential (primary) hypertension: Secondary | ICD-10-CM

## 2022-12-05 DIAGNOSIS — Z720 Tobacco use: Secondary | ICD-10-CM | POA: Diagnosis not present

## 2022-12-05 DIAGNOSIS — R058 Other specified cough: Secondary | ICD-10-CM | POA: Diagnosis not present

## 2022-12-05 DIAGNOSIS — R7302 Impaired glucose tolerance (oral): Secondary | ICD-10-CM | POA: Diagnosis not present

## 2022-12-05 DIAGNOSIS — E7800 Pure hypercholesterolemia, unspecified: Secondary | ICD-10-CM | POA: Diagnosis not present

## 2022-12-05 NOTE — Progress Notes (Signed)
Patient ID: Kevin Hale, male   DOB: 1940/06/11, 82 y.o.   MRN: [id number]        Chief Complaint: follow up prod cough       HPI:  Kevin Hale is a 82 y.o. Here with acute onset mild to mod 2-3 days ST, HA, general weakness and malaise, with prod cough greenish sputum, but Pt denies chest pain, increased sob or doe, wheezing, orthopnea, PND, increased LE swelling, palpitations, dizziness or syncope.  Covid neg per pt at recent UC visit.   Pt denies polydipsia, polyuria, or new focal neuro s/s.    Pt denies recent wt loss, night sweats, loss of appetite, or other constitutional symptoms         Wt Readings from Last 3 Encounters:  12/05/22 193 lb 12.8 oz (87.9 kg)  11/12/22 191 lb (86.6 kg)  10/31/22 190 lb (86.2 kg)   BP Readings from Last 3 Encounters:  12/05/22 126/78  10/31/22 138/88  10/23/22 136/80         Past Medical History:  Diagnosis Date   Anxiety    Atherosclerosis of abdominal aorta (HCC) 11/12/2011   BPH (benign prostatic hyperplasia)    Bronchiectasis (HCC) 03/26/2018   Cancer of prostate (HCC)    Chronic anticoagulation 09/12/2016   Coronary artery calcification seen on CT scan 03/26/2018   Diverticulosis of colon    DJD (degenerative joint disease)    left foot, "pt denies"   Glucose intolerance (impaired glucose tolerance) 11/03/2010   History of colon polyps    History of DVT (deep vein thrombosis)    History of pulmonary embolism    HTN (hypertension)    Hypercholesteremia    Lumbar degenerative disc disease 11/12/2011   Plantar fasciitis    Sleep apnea    Past Surgical History:  Procedure Laterality Date   INSERTION PROSTATE RADIATION SEED     UVULECTOMY      reports that he has been smoking cigarettes. He has never used smokeless tobacco. He reports that he does not drink alcohol and does not use drugs. family history includes Deep vein thrombosis in his mother; Diabetes in his mother; Healthy in his brother and brother. Allergies  Allergen  Reactions   Pravastatin Other (See Comments)    myalgia   Current Outpatient Medications on File Prior to Visit  Medication Sig Dispense Refill   azithromycin (ZITHROMAX) 250 MG tablet Take 250 mg by mouth as directed.     b complex vitamins tablet Take 1 tablet by mouth daily.     Cholecalciferol (VITAMIN D3) 1000 UNITS CAPS Take 1 Int'l Units by mouth daily.     losartan (COZAAR) 25 MG tablet Take 1 tablet by mouth twice daily 180 tablet 3   simvastatin (ZOCOR) 20 MG tablet TAKE 1 TABLET BY MOUTH AT BEDTIME 90 tablet 0   XARELTO 20 MG TABS tablet TAKE 1 TABLET BY MOUTH ONCE DAILY WITH SUPPER 90 tablet 0   No current facility-administered medications on file prior to visit.        ROS:  All others reviewed and negative.  Objective        PE:  BP 126/78 (BP Location: Left Arm, Patient Position: Sitting, Cuff Size: Normal)   Pulse 87   Temp 98.2 F (36.8 C) (Oral)   Ht 5\' 9"  (1.753 m)   Wt 193 lb 12.8 oz (87.9 kg)   SpO2 97%   BMI 28.62 kg/m  Constitutional: Pt appears in NAD, mild ill               HENT: Head: NCAT.                Right Ear: External ear normal.                 Left Ear: External ear normal.                Eyes: . Pupils are equal, round, and reactive to light. Conjunctivae and EOM are normal               Nose: without d/c or deformity               Neck: Neck supple. Gross normal ROM               Cardiovascular: Normal rate and regular rhythm.                 Pulmonary/Chest: Effort normal and breath sounds without rales or wheezing.                Neurological: Pt is alert. At baseline orientation, motor grossly intact               Skin: Skin is warm. No rashes, no other new lesions, LE edema - none               Psychiatric: Pt behavior is normal without agitation   Micro: none  Cardiac tracings I have personally interpreted today:  none  Pertinent Radiological findings (summarize): none   Lab Results  Component Value Date   WBC  6.6 10/23/2022   HGB 15.4 10/23/2022   HCT 49.1 10/23/2022   PLT 176.0 10/23/2022   GLUCOSE 99 10/23/2022   CHOL 105 10/23/2022   TRIG 102.0 10/23/2022   HDL 40.60 10/23/2022   LDLCALC 44 10/23/2022   ALT 32 10/23/2022   AST 35 10/23/2022   NA 139 10/23/2022   K 3.9 10/23/2022   CL 102 10/23/2022   CREATININE 1.16 10/23/2022   BUN 17 10/23/2022   CO2 29 10/23/2022   TSH 0.66 04/24/2022   PSA 0.00 (L) 10/04/2021   INR 2.1 05/28/2017   HGBA1C 5.9 10/23/2022   Assessment/Plan:  Kevin Hale is a 82 y.o. Black or African American [2] male with  has a past medical history of Anxiety, Atherosclerosis of abdominal aorta (HCC) (11/12/2011), BPH (benign prostatic hyperplasia), Bronchiectasis (HCC) (03/26/2018), Cancer of prostate (HCC), Chronic anticoagulation (09/12/2016), Coronary artery calcification seen on CT scan (03/26/2018), Diverticulosis of colon, DJD (degenerative joint disease), Glucose intolerance (impaired glucose tolerance) (11/03/2010), History of colon polyps, History of DVT (deep vein thrombosis), History of pulmonary embolism, HTN (hypertension), Hypercholesteremia, Lumbar degenerative disc disease (11/12/2011), Plantar fasciitis, and Sleep apnea.  Productive cough Mild to mod, can't r/o bronchitis vs pna, declines cxr, for antibx course zpack, delsym prm,  to f/u any worsening symptoms or concerns  Impaired glucose tolerance Lab Results  Component Value Date   HGBA1C 5.9 10/23/2022   Stable, pt to continue current medical treatment  - diet, wt control   Hypercholesteremia Lab Results  Component Value Date   LDLCALC 44 10/23/2022   Stable, pt to continue current statin zocor 20 mg qd   HTN (hypertension) BP Readings from Last 3 Encounters:  12/05/22 126/78  10/31/22 138/88  10/23/22 136/80   Stable, pt to continue medical treatment losartan 25 qd   Tobacco abuse Pt  counsled to quit smoking, pt not ready  Followup: Return if symptoms worsen or fail to  improve.  Oliver Barre, MD 12/07/2022 8:32 PM [facility]

## 2022-12-07 ENCOUNTER — Encounter: Payer: Self-pay | Admitting: Internal Medicine

## 2022-12-07 NOTE — Assessment & Plan Note (Signed)
Mild to mod, can't r/o bronchitis vs pna, declines cxr, for antibx course zpack, delsym prm,  to f/u any worsening symptoms or concerns

## 2022-12-07 NOTE — Assessment & Plan Note (Signed)
Pt counsled to quit smoking, pt not ready 

## 2022-12-07 NOTE — Assessment & Plan Note (Signed)
BP Readings from Last 3 Encounters:  12/05/22 126/78  10/31/22 138/88  10/23/22 136/80   Stable, pt to continue medical treatment losartan 25 qd

## 2022-12-07 NOTE — Patient Instructions (Addendum)
Please take all new medication as prescribed - the antibiotic,  You can also take Delsym OTC for cough, and/or Mucinex (or it's generic off brand) for congestion, and tylenol as needed for pain.  Please continue all other medications as before, and refills have been done if requested.  Please have the pharmacy call with any other refills you may need.  Please continue your efforts at being more active, low cholesterol diet, and weight control  Please keep your appointments with your specialists as you may have planned

## 2022-12-07 NOTE — Assessment & Plan Note (Signed)
Lab Results  Component Value Date   LDLCALC 44 10/23/2022   Stable, pt to continue current statin zocor 20 mg qd

## 2022-12-07 NOTE — Assessment & Plan Note (Signed)
Lab Results  Component Value Date   HGBA1C 5.9 10/23/2022   Stable, pt to continue current medical treatment  - diet,wt control

## 2022-12-13 ENCOUNTER — Ambulatory Visit: Payer: Medicare Other | Admitting: Podiatry

## 2022-12-13 DIAGNOSIS — L84 Corns and callosities: Secondary | ICD-10-CM

## 2022-12-13 DIAGNOSIS — I872 Venous insufficiency (chronic) (peripheral): Secondary | ICD-10-CM

## 2022-12-13 DIAGNOSIS — B353 Tinea pedis: Secondary | ICD-10-CM | POA: Diagnosis not present

## 2022-12-13 MED ORDER — KETOCONAZOLE 2 % EX CREA: 1.0000 | TOPICAL_CREAM | Freq: Every day | CUTANEOUS | 2 refills | Status: DC

## 2022-12-13 NOTE — Progress Notes (Signed)
Subjective:  Patient ID: Kevin Hale, male    DOB: [birth year],  MRN: [id number]  Chief Complaint  Patient presents with   Plantar Fasciitis    Patient presents stating that he still has a lot of pain in his right heel states it is worse when he gets up in the morning and after periods of sitting.    Discussed the use of AI scribe software for clinical note transcription with the patient, who gave verbal consent to proceed.  History of Present Illness   The patient presents with pain in the right foot, specifically in the arch. He describes a hard spot in the area, which upon examination, is identified as a corn. The patient also reports dry skin on the foot, which is suspected to be athlete's foot. He has previously seen another doctor for heel pain, but this current pain is different. The patient also mentions swelling on the side of the foot, but denies any associated pain. He has a history of spider veins and varicose veins.          Objective:    Physical Exam   CARDIOVASCULAR: Palpable pulses present. EXTREMITIES: Swelling around spider veins and varicose veins lateral ankle. SKIN: Corn in plantar lateral arch under cuboid. Tinea pedis with dry scaling, moccasin distribution rash.       No images are attached to the encounter.    Results   Procedure: Shaving of corn Description: Shaving of hyperkeratotic tissue from corn in the plantar lateral arch. Application of salicylic acid to the area. Covered with an adhesive bandage. Informed Consent: Counseling about the use of salicylic acid, its effects, and over-the-counter alternatives. Discussion of the procedure's steps and expected outcomes.      Assessment:   1. Tinea pedis of both feet   2. Corn of foot   3. 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  Assessment and Plan    Corn   The corn located in the  plantar lateral arch under the cuboid was shaved down and treated with salicylic acid. We will apply salinocaine and he will use sal acid 40% pads nightly, such as Doctor Scholl's extra strength wart or corn remover.  Tinea Pedis   He exhibits a dry scaling, moccasin distribution rash on both feet. We will prescribe an antifungal cream Ketoconazole to be applied twice daily to both feet.  Chronic Venous Insufficiency   He presents with swelling and discoloration around spider veins and varicose veins on the ankle, without reported discomfort. We advise elevation of legs at the end of the day and continuation of compression stockings use.          Return if symptoms worsen or fail to improve.

## 2022-12-13 NOTE — Patient Instructions (Signed)
VISIT SUMMARY:  During your visit, we discussed your concerns about the pain in your right foot, dry skin, and swelling. We identified a corn in your foot, which we treated, and we suspect that the dry skin is due to athlete's foot. We also noted the swelling around your spider veins and varicose veins.  YOUR PLAN:  -CORN: A corn is a hard, thickened area of skin that forms due to pressure or friction. We shaved down the corn and treated it with salicylic acid. You should apply over-the-counter salicylic acid 40% pads, such as Doctor Scholl's extra strength wart or corn remover, to the area every night.  -ATHLETE'S FOOT: Athlete's foot is a fungal infection that causes dry, scaling skin. We prescribed an antifungal cream for you to apply twice daily to both feet.  -CHRONIC VENOUS INSUFFICIENCY: Chronic venous insufficiency is a condition where the veins in your legs have trouble sending blood back to your heart. We recommend elevating your legs at the end of the day and continuing to use compression stockings.  INSTRUCTIONS:  Please remember to apply the salicylic acid pads to your corn every night and the antifungal cream to both feet twice daily. Continue to elevate your legs at the end of the day and use your compression stockings. If you notice any changes or if your symptoms worsen, please contact our office.

## 2023-01-07 DIAGNOSIS — N3041 Irradiation cystitis with hematuria: Secondary | ICD-10-CM | POA: Diagnosis not present

## 2023-01-11 ENCOUNTER — Other Ambulatory Visit: Payer: Self-pay | Admitting: Internal Medicine

## 2023-01-14 ENCOUNTER — Other Ambulatory Visit: Payer: Self-pay

## 2023-02-12 ENCOUNTER — Other Ambulatory Visit: Payer: Self-pay | Admitting: Internal Medicine

## 2023-03-01 ENCOUNTER — Ambulatory Visit (INDEPENDENT_AMBULATORY_CARE_PROVIDER_SITE_OTHER): Payer: Medicare Other | Admitting: Family Medicine

## 2023-03-01 ENCOUNTER — Encounter: Payer: Self-pay | Admitting: Family Medicine

## 2023-03-01 VITALS — BP 130/70 | HR 78 | Temp 98.4°F | Ht 69.0 in | Wt 195.2 lb

## 2023-03-01 DIAGNOSIS — J324 Chronic pansinusitis: Secondary | ICD-10-CM

## 2023-03-01 DIAGNOSIS — J069 Acute upper respiratory infection, unspecified: Secondary | ICD-10-CM

## 2023-03-01 DIAGNOSIS — B9689 Other specified bacterial agents as the cause of diseases classified elsewhere: Secondary | ICD-10-CM

## 2023-03-01 MED ORDER — DOXYCYCLINE HYCLATE 100 MG PO CAPS: 100.0000 mg | ORAL_CAPSULE | Freq: Two times a day (BID) | ORAL | 0 refills | Status: AC

## 2023-03-01 MED ORDER — AZITHROMYCIN 250 MG PO TABS: ORAL_TABLET | ORAL | 0 refills | Status: DC

## 2023-03-01 NOTE — Progress Notes (Signed)
 Acute Office Visit  Subjective:     Patient ID: Kevin Hale, male    DOB: 05/17/1940, 83 y.o.   MRN: [id number]  Chief Complaint  Patient presents with   Cough    Cough, nasal congestion, nasal drip off and on for the past 3 years. Most recent bout has been going on for the past 2 months. Currently treating with mucinex and coricidin which helps alleviate some symptoms.    HPI Patient is in today for evaluation of cough, congestion, sinus pressure, for the last 2 months.  He was seen in this office 2-1/2 months ago and was treated with azithromycin .  States that this did not improve his symptoms. Has tried OTC Mucinex and Coricidin.  States this helps temporarily. Reports that his wife has been sick recently, but she has recovered. Denies abdominal pain, nausea, vomiting, diarrhea, rash, fever, chills, other symptoms.  Medical hx as outlined below.  ROS Per HPI      Objective:    BP 130/70   Pulse 78   Temp 98.4 F (36.9 C)   Ht 5' 9 (1.753 m)   Wt 195 lb 3.2 oz (88.5 kg)   SpO2 97%   BMI 28.83 kg/m    Physical Exam Vitals and nursing note reviewed.  Constitutional:      General: He is not in acute distress.    Comments: Appears fatigued  HENT:     Head: Normocephalic and atraumatic.     Right Ear: Tympanic membrane and ear canal normal.     Left Ear: Tympanic membrane and ear canal normal.     Nose: Congestion present.     Mouth/Throat:     Mouth: Mucous membranes are moist.     Pharynx: Oropharynx is clear. No oropharyngeal exudate or posterior oropharyngeal erythema.     Comments: Oropharyngeal cobblestoning   Eyes:     Extraocular Movements: Extraocular movements intact.  Cardiovascular:     Rate and Rhythm: Normal rate and regular rhythm.     Heart sounds: Normal heart sounds.  Pulmonary:     Effort: Pulmonary effort is normal. No respiratory distress.     Breath sounds: No wheezing.     Comments: Productive cough  Musculoskeletal:     Cervical  back: Normal range of motion.  Lymphadenopathy:     Cervical: No cervical adenopathy.  Skin:    General: Skin is warm and dry.  Neurological:     Mental Status: He is alert.    No results found for any visits on 03/01/23.      Assessment & Plan:  1. Bacterial URI (Primary)  - doxycycline  (VIBRAMYCIN ) 100 MG capsule; Take 1 capsule (100 mg total) by mouth 2 (two) times daily for 7 days.  Dispense: 14 capsule; Refill: 0  2. Chronic pansinusitis  - Ambulatory referral to ENT - doxycycline  (VIBRAMYCIN ) 100 MG capsule; Take 1 capsule (100 mg total) by mouth 2 (two) times daily for 7 days.  Dispense: 14 capsule; Refill: 0    Meds ordered this encounter  Medications   DISCONTD: azithromycin  (ZITHROMAX  Z-PAK) 250 MG tablet    Sig: Take 2 tablets (500 mg) PO today, then 1 tablet (250 mg) PO daily x4 days.    Dispense:  6 tablet    Refill:  0   doxycycline  (VIBRAMYCIN ) 100 MG capsule    Sig: Take 1 capsule (100 mg total) by mouth 2 (two) times daily for 7 days.    Dispense:  14 capsule  Refill:  0    Return if symptoms worsen or fail to improve.  Corean Ku, FNP

## 2023-03-01 NOTE — Patient Instructions (Signed)
 I have sent in a prescription for doxycycline  for you to take twice a day for 7 days. Take this medication with food as it can upset your stomach if you do not.  We have placed a referral for you today. Someone will be reaching out to get you scheduled.  Follow-up with me for new or worsening symptoms.

## 2023-03-06 ENCOUNTER — Encounter (INDEPENDENT_AMBULATORY_CARE_PROVIDER_SITE_OTHER): Payer: Self-pay | Admitting: Otolaryngology

## 2023-03-14 DIAGNOSIS — R09A2 Foreign body sensation, throat: Secondary | ICD-10-CM | POA: Diagnosis not present

## 2023-03-28 ENCOUNTER — Institutional Professional Consult (permissible substitution) (INDEPENDENT_AMBULATORY_CARE_PROVIDER_SITE_OTHER): Payer: Medicare Other

## 2023-04-02 ENCOUNTER — Telehealth: Payer: Self-pay | Admitting: Internal Medicine

## 2023-04-02 MED ORDER — LOSARTAN POTASSIUM 25 MG PO TABS: 25.0000 mg | ORAL_TABLET | Freq: Two times a day (BID) | ORAL | 1 refills | Status: DC

## 2023-04-02 NOTE — Telephone Encounter (Signed)
*  STAT* If patient is at the pharmacy, call can be transferred to refill team.   1. Which medications need to be refilled? (please list name of each medication and dose if known) Losartan    2. Would you like to learn more about the convenience, safety, & potential cost savings by using the [facility]?     3. Are you open to using the [facility] (Type [facility].    4. Which pharmacy/location (including street and city if local pharmacy) is medication to be sent to?[facility] [address] [place]   5. Do they need a 30 day or 90 day supply?  Pateint needs enough until his appointment ob 05-16-23 with Jackee

## 2023-04-15 DIAGNOSIS — H40013 Open angle with borderline findings, low risk, bilateral: Secondary | ICD-10-CM | POA: Diagnosis not present

## 2023-04-25 ENCOUNTER — Ambulatory Visit: Payer: Medicare Other | Admitting: Internal Medicine

## 2023-04-25 ENCOUNTER — Encounter: Payer: Self-pay | Admitting: Internal Medicine

## 2023-04-25 VITALS — BP 116/68 | HR 75 | Temp 98.6°F | Ht 69.0 in | Wt 196.0 lb

## 2023-04-25 DIAGNOSIS — E7800 Pure hypercholesterolemia, unspecified: Secondary | ICD-10-CM

## 2023-04-25 DIAGNOSIS — M79604 Pain in right leg: Secondary | ICD-10-CM

## 2023-04-25 DIAGNOSIS — I10 Essential (primary) hypertension: Secondary | ICD-10-CM | POA: Diagnosis not present

## 2023-04-25 DIAGNOSIS — Z0001 Encounter for general adult medical examination with abnormal findings: Secondary | ICD-10-CM | POA: Diagnosis not present

## 2023-04-25 DIAGNOSIS — R7302 Impaired glucose tolerance (oral): Secondary | ICD-10-CM

## 2023-04-25 DIAGNOSIS — M79605 Pain in left leg: Secondary | ICD-10-CM

## 2023-04-25 DIAGNOSIS — E538 Deficiency of other specified B group vitamins: Secondary | ICD-10-CM

## 2023-04-25 DIAGNOSIS — E559 Vitamin D deficiency, unspecified: Secondary | ICD-10-CM | POA: Diagnosis not present

## 2023-04-25 DIAGNOSIS — Z720 Tobacco use: Secondary | ICD-10-CM | POA: Diagnosis not present

## 2023-04-25 DIAGNOSIS — R21 Rash and other nonspecific skin eruption: Secondary | ICD-10-CM

## 2023-04-25 LAB — CBC WITH DIFFERENTIAL/PLATELET
Basophils Absolute: 0 10*3/uL (ref 0.0–0.1)
Basophils Relative: 0.4 % (ref 0.0–3.0)
Eosinophils Absolute: 0.2 10*3/uL (ref 0.0–0.7)
Eosinophils Relative: 2.7 % (ref 0.0–5.0)
HCT: 48.8 % (ref 39.0–52.0)
Hemoglobin: 15.9 g/dL (ref 13.0–17.0)
Lymphocytes Relative: 46.1 % — ABNORMAL HIGH (ref 12.0–46.0)
Lymphs Abs: 2.9 10*3/uL (ref 0.7–4.0)
MCHC: 32.5 g/dL (ref 30.0–36.0)
MCV: 88.9 fl (ref 78.0–100.0)
Monocytes Absolute: 0.5 10*3/uL (ref 0.1–1.0)
Monocytes Relative: 8.2 % (ref 3.0–12.0)
Neutro Abs: 2.7 10*3/uL (ref 1.4–7.7)
Neutrophils Relative %: 42.6 % — ABNORMAL LOW (ref 43.0–77.0)
Platelets: 235 10*3/uL (ref 150.0–400.0)
RBC: 5.49 Mil/uL (ref 4.22–5.81)
RDW: 13.9 % (ref 11.5–15.5)
WBC: 6.4 10*3/uL (ref 4.0–10.5)

## 2023-04-25 LAB — URINALYSIS, ROUTINE W REFLEX MICROSCOPIC
Bilirubin Urine: NEGATIVE
Hgb urine dipstick: NEGATIVE
Ketones, ur: NEGATIVE
Leukocytes,Ua: NEGATIVE
Nitrite: NEGATIVE
RBC / HPF: NONE SEEN (ref 0–?)
Specific Gravity, Urine: 1.015 (ref 1.000–1.030)
Total Protein, Urine: NEGATIVE
Urine Glucose: 500 — AB
Urobilinogen, UA: 0.2 (ref 0.0–1.0)
WBC, UA: NONE SEEN (ref 0–?)
pH: 7 (ref 5.0–8.0)

## 2023-04-25 LAB — HEPATIC FUNCTION PANEL
ALT: 15 U/L (ref 0–53)
AST: 16 U/L (ref 0–37)
Albumin: 4.1 g/dL (ref 3.5–5.2)
Alkaline Phosphatase: 71 U/L (ref 39–117)
Bilirubin, Direct: 0.2 mg/dL (ref 0.0–0.3)
Total Bilirubin: 0.6 mg/dL (ref 0.2–1.2)
Total Protein: 7.2 g/dL (ref 6.0–8.3)

## 2023-04-25 LAB — BASIC METABOLIC PANEL
BUN: 13 mg/dL (ref 6–23)
CO2: 31 meq/L (ref 19–32)
Calcium: 9.5 mg/dL (ref 8.4–10.5)
Chloride: 104 meq/L (ref 96–112)
Creatinine, Ser: 1.22 mg/dL (ref 0.40–1.50)
GFR: 54.86 mL/min — ABNORMAL LOW (ref >60.00)
Glucose, Bld: 122 mg/dL — ABNORMAL HIGH (ref 70–99)
Potassium: 4.5 meq/L (ref 3.5–5.1)
Sodium: 139 meq/L (ref 135–145)

## 2023-04-25 LAB — VITAMIN B12: Vitamin B-12: 771 pg/mL (ref 211–911)

## 2023-04-25 LAB — LIPID PANEL
Cholesterol: 105 mg/dL (ref 0–200)
HDL: 44.6 mg/dL (ref >39.00)
LDL Cholesterol: 45 mg/dL (ref 0–99)
NonHDL: 60.33
Total CHOL/HDL Ratio: 2
Triglycerides: 77 mg/dL (ref 0.0–149.0)
VLDL: 15.4 mg/dL (ref 0.0–40.0)

## 2023-04-25 LAB — VITAMIN D 25 HYDROXY (VIT D DEFICIENCY, FRACTURES): VITD: 50.02 ng/mL (ref 30.00–100.00)

## 2023-04-25 LAB — TSH: TSH: 0.84 u[IU]/mL (ref 0.35–5.50)

## 2023-04-25 LAB — HEMOGLOBIN A1C: Hgb A1c MFr Bld: 5.7 % (ref 4.6–6.5)

## 2023-04-25 MED ORDER — TRIAMCINOLONE ACETONIDE 0.1 % EX CREA: 1.0000 | TOPICAL_CREAM | Freq: Two times a day (BID) | CUTANEOUS | 1 refills | Status: DC

## 2023-04-25 NOTE — Progress Notes (Signed)
 Patient ID: Kevin Hale, male   DOB: February 07, 1941, 83 y.o.   MRN: [id number]         Chief Complaint:: wellness exam and Medical Management of Chronic Issues (6 month follow up, concerns about right leg and arm being itchy for a month )  , hld, hyperglycemia, smoker, htn       HPI:  Kevin Hale is a 83 y.o. male here for wellness exam; up to date                        Also Pt denies chest pain, increased sob or doe, wheezing, orthopnea, PND, increased LE swelling, palpitations, dizziness or syncope, but does have right > left leg pain below the knees with walking, better with rest.  Also has an itchy rash x 1 wk to right arm and lower leg for unclear reason.   Pt denies polydipsia, polyuria, or new focal neuro s/s.    Pt denies fever, wt loss, night sweats, loss of appetite, or other constitutional symptoms     Wt Readings from Last 3 Encounters:  04/25/23 196 lb (88.9 kg)  03/01/23 195 lb 3.2 oz (88.5 kg)  12/05/22 193 lb 12.8 oz (87.9 kg)   BP Readings from Last 3 Encounters:  04/25/23 116/68  03/01/23 130/70  12/05/22 126/78   Immunization History  Administered Date(s) Administered   Fluad Quad(high Dose 65+) 10/30/2018, 11/15/2020, 11/03/2021   Influenza Whole 11/28/2009, 11/06/2010, 11/10/2011   Influenza, High Dose Seasonal PF 11/27/2014, 12/31/2014, 10/17/2016, 10/26/2017   Influenza, Seasonal, Injecte, Preservative Fre 10/23/2022   Influenza,inj,Quad PF,6+ Mos 11/11/2012, 12/22/2015, 12/18/2019   Influenza-Unspecified 12/04/2002, 11/27/2003, 12/28/2003, 10/23/2010, 12/28/2011, 10/27/2013, 11/30/2014, 10/28/2018, 10/28/2022   Moderna Covid-19 Vaccine Bivalent Booster 43yrs & up 11/24/2020   Moderna Sars-Covid-2 Vaccination 04/11/2019, 05/09/2019, 01/04/2020, 06/27/2020   Pfizer(Comirnaty)Fall Seasonal Vaccine 12 years and older 11/09/2022   Pneumococcal Conjugate-13 11/11/2012, 06/15/2014   Pneumococcal Polysaccharide-23 02/28/2004, 11/13/2012   Pneumococcal-Unspecified  01/05/2004, 12/28/2010   Rsv, Bivalent, Protein Subunit Rsvpref,pf (Abrysvo) 11/13/2022   Td 02/27/2000   Td (Adult),unspecified 05/09/2012   Tdap 06/15/2014   Tetanus 05/09/2012   Zoster Recombinant(Shingrix) 01/29/2018, 05/07/2018   Zoster, Live 05/19/2013, 01/29/2018   There are no preventive care reminders to display for this patient.     Past Medical History:  Diagnosis Date   Anxiety    Atherosclerosis of abdominal aorta (HCC) 11/12/2011   BPH (benign prostatic hyperplasia)    Bronchiectasis (HCC) 03/26/2018   Cancer of prostate (HCC)    Chronic anticoagulation 09/12/2016   Coronary artery calcification seen on CT scan 03/26/2018   Diverticulosis of colon    DJD (degenerative joint disease)    left foot, "pt denies"   Glucose intolerance (impaired glucose tolerance) 11/03/2010   History of colon polyps    History of DVT (deep vein thrombosis)    History of pulmonary embolism    HTN (hypertension)    Hypercholesteremia    Lumbar degenerative disc disease 11/12/2011   Plantar fasciitis    Sleep apnea    Past Surgical History:  Procedure Laterality Date   INSERTION PROSTATE RADIATION SEED     UVULECTOMY      reports that he has been smoking cigarettes. He has never used smokeless tobacco. He reports that he does not drink alcohol and does not use drugs. family history includes Deep vein thrombosis in his mother; Diabetes in his mother; Healthy in his brother and brother. Allergies  Allergen Reactions  Pravastatin Other (See Comments)    myalgia   Current Outpatient Medications on File Prior to Visit  Medication Sig Dispense Refill   b complex vitamins tablet Take 1 tablet by mouth daily.     Cholecalciferol (VITAMIN D3) 1000 UNITS CAPS Take 1 Int'l Units by mouth daily.     ketoconazole (NIZORAL) 2 % cream Apply 1 Application topically daily. 60 g 2   losartan (COZAAR) 25 MG tablet Take 1 tablet (25 mg total) by mouth 2 (two) times daily. 60 tablet 1    simvastatin (ZOCOR) 20 MG tablet TAKE 1 TABLET BY MOUTH AT BEDTIME 90 tablet 3   XARELTO 20 MG TABS tablet TAKE 1 TABLET BY MOUTH ONCE DAILY WITH SUPPER 90 tablet 0   No current facility-administered medications on file prior to visit.        ROS:  All others reviewed and negative.  Objective        PE:  BP 116/68 (BP Location: Right Arm, Patient Position: Sitting, Cuff Size: Normal)   Pulse 75   Temp 98.6 F (37 C) (Oral)   Ht 5\' 9"  (1.753 m)   Wt 196 lb (88.9 kg)   SpO2 99%   BMI 28.94 kg/m                 Constitutional: Pt appears in NAD               HENT: Head: NCAT.                Right Ear: External ear normal.                 Left Ear: External ear normal.                Eyes: . Pupils are equal, round, and reactive to light. Conjunctivae and EOM are normal               Nose: without d/c or deformity               Neck: Neck supple. Gross normal ROM               Cardiovascular: Normal rate and regular rhythm.                 Pulmonary/Chest: Effort normal and breath sounds without rales or wheezing.                Abd:  Soft, NT, ND, + BS, no organomegaly               Neurological: Pt is alert. At baseline orientation, motor grossly intact               Skin: Skin is warm. Trace nontender erythm rashes to right arm and leg, no other new lesions, LE edema - none               Psychiatric: Pt behavior is normal without agitation   Micro: none  Cardiac tracings I have personally interpreted today:  none  Pertinent Radiological findings (summarize): none   Lab Results  Component Value Date   WBC 6.4 04/25/2023   HGB 15.9 04/25/2023   HCT 48.8 04/25/2023   PLT 235.0 04/25/2023   GLUCOSE 122 (H) 04/25/2023   CHOL 105 04/25/2023   TRIG 77.0 04/25/2023   HDL 44.60 04/25/2023   LDLCALC 45 04/25/2023   ALT 15 04/25/2023   AST 16 04/25/2023   NA 139 04/25/2023  K 4.5 04/25/2023   CL 104 04/25/2023   CREATININE 1.22 04/25/2023   BUN 13 04/25/2023   CO2 31  04/25/2023   TSH 0.84 04/25/2023   PSA 0.00 (L) 10/04/2021   INR 2.1 05/28/2017   HGBA1C 5.7 04/25/2023   Assessment/Plan:  Kevin Hale is a 83 y.o. Black or African American [2] male with  has a past medical history of Anxiety, Atherosclerosis of abdominal aorta (HCC) (11/12/2011), BPH (benign prostatic hyperplasia), Bronchiectasis (HCC) (03/26/2018), Cancer of prostate (HCC), Chronic anticoagulation (09/12/2016), Coronary artery calcification seen on CT scan (03/26/2018), Diverticulosis of colon, DJD (degenerative joint disease), Glucose intolerance (impaired glucose tolerance) (11/03/2010), History of colon polyps, History of DVT (deep vein thrombosis), History of pulmonary embolism, HTN (hypertension), Hypercholesteremia, Lumbar degenerative disc disease (11/12/2011), Plantar fasciitis, and Sleep apnea.  Encounter for well adult exam with abnormal findings Age and sex appropriate education and counseling updated with regular exercise and diet Referrals for preventative services - none needed Immunizations addressed - none needed Smoking counseling  - pt counsled to quit, pt not ready Evidence for depression or other mood disorder - none significant Most recent labs reviewed. I have personally reviewed and have noted: 1) the patient's medical and social history 2) The patient's current medications and supplements 3) The patient's height, weight, and BMI have been recorded in the chart   HTN (hypertension) BP Readings from Last 3 Encounters:  04/25/23 116/68  03/01/23 130/70  12/05/22 126/78   Stable, pt to continue medical treatment losartan 25 mg qd    Hypercholesteremia Lab Results  Component Value Date   LDLCALC 45 04/25/2023   Stable, pt to continue current statin zocor 20 mg qd   Impaired glucose tolerance Lab Results  Component Value Date   HGBA1C 5.7 04/25/2023   Stable, pt to continue current medical treatment  - diet, wt control   Tobacco abuse Pt  counsled to quit, pt not ready  Bilateral leg pain With some suspicion for symptomatic PAD - for arterial dopplers  Localized rash Mild to mod, for triam cr prn,,  to f/u any worsening symptoms or concerns  Followup: Return in about 6 months (around 10/23/2023).  Oliver Barre, MD 04/28/2023 3:10 PM [facility]

## 2023-04-25 NOTE — Progress Notes (Signed)
 The test results show that your current treatment is OK, as the tests are stable.  Please continue the same plan.  There is no other need for change of treatment or further evaluation based on these results, at this time.  thanks

## 2023-04-25 NOTE — Patient Instructions (Signed)
 Please take all new medication as prescribed - the cream for the itching  Please continue all other medications as before, and refills have been done if requested.  Please have the pharmacy call with any other refills you may need.  Please continue your efforts at being more active, low cholesterol diet, and weight control.  You are otherwise up to date with prevention measures today.  Please keep your appointments with your specialists as you may have planned  You will be contacted regarding the referral for: leg circulation testing  Please go to the LAB at the blood drawing area for the tests to be done  You will be contacted by phone if any changes need to be made immediately.  Otherwise, you will receive a letter about your results with an explanation, but please check with MyChart first.  Please make an Appointment to return in 6 months, or sooner if needed

## 2023-04-28 NOTE — Assessment & Plan Note (Signed)
 Lab Results  Component Value Date   LDLCALC 45 04/25/2023   Stable, pt to continue current statin zocor 20 mg qd

## 2023-04-28 NOTE — Assessment & Plan Note (Signed)
 BP Readings from Last 3 Encounters:  04/25/23 116/68  03/01/23 130/70  12/05/22 126/78   Stable, pt to continue medical treatment losartan 25 mg qd

## 2023-04-28 NOTE — Assessment & Plan Note (Signed)
 With some suspicion for symptomatic PAD - for arterial dopplers

## 2023-04-28 NOTE — Assessment & Plan Note (Signed)
Mild to mod, for triam cr prn,,  to f/u any worsening symptoms or concerns

## 2023-04-28 NOTE — Assessment & Plan Note (Signed)
 Lab Results  Component Value Date   HGBA1C 5.7 04/25/2023   Stable, pt to continue current medical treatment  - diet, wt control

## 2023-04-28 NOTE — Assessment & Plan Note (Signed)
 Pt counsled to quit, pt not ready

## 2023-04-28 NOTE — Assessment & Plan Note (Addendum)
Age and sex appropriate education and counseling updated with regular exercise and diet Referrals for preventative services - none needed Immunizations addressed - none needed Smoking counseling  - pt counsled to quit, pt not ready Evidence for depression or other mood disorder - none significant Most recent labs reviewed. I have personally reviewed and have noted: 1) the patient's medical and social history 2) The patient's current medications and supplements 3) The patient's height, weight, and BMI have been recorded in the chart

## 2023-05-13 ENCOUNTER — Emergency Department (HOSPITAL_BASED_OUTPATIENT_CLINIC_OR_DEPARTMENT_OTHER)

## 2023-05-13 ENCOUNTER — Emergency Department (HOSPITAL_BASED_OUTPATIENT_CLINIC_OR_DEPARTMENT_OTHER)
Admission: EM | Admit: 2023-05-13 | Discharge: 2023-05-13 | Disposition: A | Discharge status: Home / Self Care | Attending: Emergency Medicine | Admitting: Emergency Medicine

## 2023-05-13 ENCOUNTER — Emergency Department (HOSPITAL_BASED_OUTPATIENT_CLINIC_OR_DEPARTMENT_OTHER): Admitting: Radiology

## 2023-05-13 ENCOUNTER — Other Ambulatory Visit: Payer: Self-pay

## 2023-05-13 DIAGNOSIS — J069 Acute upper respiratory infection, unspecified: Secondary | ICD-10-CM | POA: Insufficient documentation

## 2023-05-13 DIAGNOSIS — R55 Syncope and collapse: Secondary | ICD-10-CM | POA: Insufficient documentation

## 2023-05-13 DIAGNOSIS — R059 Cough, unspecified: Secondary | ICD-10-CM | POA: Diagnosis not present

## 2023-05-13 DIAGNOSIS — J929 Pleural plaque without asbestos: Secondary | ICD-10-CM | POA: Diagnosis not present

## 2023-05-13 DIAGNOSIS — Z7901 Long term (current) use of anticoagulants: Secondary | ICD-10-CM | POA: Insufficient documentation

## 2023-05-13 DIAGNOSIS — I6782 Cerebral ischemia: Secondary | ICD-10-CM | POA: Diagnosis not present

## 2023-05-13 DIAGNOSIS — R42 Dizziness and giddiness: Secondary | ICD-10-CM | POA: Diagnosis present

## 2023-05-13 DIAGNOSIS — S0990XA Unspecified injury of head, initial encounter: Secondary | ICD-10-CM | POA: Diagnosis not present

## 2023-05-13 DIAGNOSIS — I700 Atherosclerosis of aorta: Secondary | ICD-10-CM | POA: Diagnosis not present

## 2023-05-13 LAB — CBC
HCT: 49.6 % (ref 39.0–52.0)
Hemoglobin: 15.7 g/dL (ref 13.0–17.0)
MCH: 27.9 pg (ref 26.0–34.0)
MCHC: 31.7 g/dL (ref 30.0–36.0)
MCV: 88.3 fL (ref 80.0–100.0)
Platelets: 229 10*3/uL (ref 150–400)
RBC: 5.62 MIL/uL (ref 4.22–5.81)
RDW: 13.3 % (ref 11.5–15.5)
WBC: 5.8 10*3/uL (ref 4.0–10.5)
nRBC: 0 % (ref 0.0–0.2)

## 2023-05-13 LAB — BASIC METABOLIC PANEL
Anion gap: 4 — ABNORMAL LOW (ref 5–15)
BUN: 12 mg/dL (ref 8–23)
CO2: 30 mmol/L (ref 22–32)
Calcium: 9.4 mg/dL (ref 8.9–10.3)
Chloride: 105 mmol/L (ref 98–111)
Creatinine, Ser: 1.14 mg/dL (ref 0.61–1.24)
GFR, Estimated: >60 mL/min (ref >60)
Glucose, Bld: 107 mg/dL — ABNORMAL HIGH (ref 70–99)
Potassium: 4.6 mmol/L (ref 3.5–5.1)
Sodium: 139 mmol/L (ref 135–145)

## 2023-05-13 LAB — URINALYSIS, ROUTINE W REFLEX MICROSCOPIC
Bilirubin Urine: NEGATIVE
Glucose, UA: NEGATIVE mg/dL
Hgb urine dipstick: NEGATIVE
Ketones, ur: NEGATIVE mg/dL
Leukocytes,Ua: NEGATIVE
Nitrite: NEGATIVE
Protein, ur: NEGATIVE mg/dL
Specific Gravity, Urine: 1.015 (ref 1.005–1.030)
pH: 7 (ref 5.0–8.0)

## 2023-05-13 LAB — RESP PANEL BY RT-PCR (RSV, FLU A&B, COVID)  RVPGX2
Influenza A by PCR: NEGATIVE
Influenza B by PCR: NEGATIVE
Resp Syncytial Virus by PCR: NEGATIVE
SARS Coronavirus 2 by RT PCR: NEGATIVE

## 2023-05-13 LAB — TROPONIN I (HIGH SENSITIVITY): Troponin I (High Sensitivity): 7 ng/L (ref <18)

## 2023-05-13 NOTE — Discharge Instructions (Addendum)
 It was our pleasure to provide your ER care today - we hope that you feel better.  Overall, your lab tests look good/normal.   Drink plenty of fluids/stay well hydrated. Fall precautions.   If cold/flu symptoms, you may try nyquil, mucinex, robitussin or similar over the counter cold and flu medication for symptom relief.   Follow up closely with primary care doctor in the coming week - call office to arrange follow up appointment.   Return to ER right if worse, new symptoms, fevers, recurrent fainting, chest pain, trouble breathing, new/severe pain, or other emergency concern.

## 2023-05-13 NOTE — ED Triage Notes (Signed)
 Pt w family, states pt "blacked out & fell," daughter states he's had episodes similar in the past "when he had the flu." Advised pt has had "dizziness today, I had to help him to the car."

## 2023-05-13 NOTE — ED Notes (Signed)
 Pt ambulated standby assist to restroom, pt w/ steady gait, denied dizziness, reported some weakness still. Reports feeling better than earlier.

## 2023-05-13 NOTE — ED Provider Notes (Signed)
 [place] [facility]MENT AT [facility] Provider Note   CSN: [id number] Arrival date & time: 05/13/23  1157     History  Chief Complaint  Patient presents with   Dizziness   Weakness   Loss of Consciousness    Kevin Hale is a 83 y.o. male.  Pt with syncopal event this AM. Indicates had felt possibly had cold/flu in past few days with non prod cough, congestion. No sore throat or trouble swallowing. No purulent sinus drainage or sinus pressure/pain. Denies known covid, flu or other specific ill contact exposure. No fevers. Felt generally tired this AM, and then when stood from sitting felt faint, and briefly passed out. ?hit head. ?momentary loc. No seizure activity. Is on anticoag theraly/xarelto. Denies abnormal bruising or bleeding. No melena or rectal bleeding. Denies headache. No neck/back pain. No chest pain or discomfort. No sob or unusual doe. No abd pain or nvd. No dysuria or gu c/o. No extremity pain, swelling or injury.   The history is provided by the patient, medical records, a relative and the spouse.  Dizziness Associated symptoms: syncope and weakness   Associated symptoms: no blood in stool, no chest pain, no diarrhea, no headaches, no palpitations, no shortness of breath and no vomiting   Weakness Associated symptoms: cough, dizziness and syncope   Associated symptoms: no abdominal pain, no chest pain, no diarrhea, no dysuria, no fever, no headaches, no seizures, no shortness of breath and no vomiting   Loss of Consciousness Associated symptoms: dizziness and weakness   Associated symptoms: no chest pain, no confusion, no fever, no headaches, no palpitations, no seizures, no shortness of breath and no vomiting        Home Medications Prior to Admission medications   Medication Sig Start Date End Date Taking? Authorizing Provider  b complex vitamins tablet Take 1 tablet by mouth daily.    [provider]  Cholecalciferol (VITAMIN D3)  1000 UNITS CAPS Take 1 Int'l Units by mouth daily.    [provider]  ketoconazole (NIZORAL) 2 % cream Apply 1 Application topically daily. 12/13/22   McDonald, Rachelle Hora, DPM  losartan (COZAAR) 25 MG tablet Take 1 tablet (25 mg total) by mouth 2 (two) times daily. 04/02/23   Pricilla Riffle, MD  simvastatin (ZOCOR) 20 MG tablet TAKE 1 TABLET BY MOUTH AT BEDTIME 01/14/23   Corwin Levins, MD  triamcinolone cream (KENALOG) 0.1 % Apply 1 Application topically 2 (two) times daily. 04/25/23 04/24/24  Corwin Levins, MD  XARELTO 20 MG TABS tablet TAKE 1 TABLET BY MOUTH ONCE DAILY WITH SUPPER 11/09/19   Pricilla Riffle, MD      Allergies    Pravastatin    Review of Systems   Review of Systems  Constitutional:  Negative for fever.  HENT:  Positive for congestion and rhinorrhea. Negative for sore throat.   Eyes:  Negative for redness and visual disturbance.  Respiratory:  Positive for cough. Negative for shortness of breath.   Cardiovascular:  Positive for syncope. Negative for chest pain, palpitations and leg swelling.  Gastrointestinal:  Negative for abdominal pain, blood in stool, diarrhea and vomiting.  Genitourinary:  Negative for dysuria and flank pain.  Musculoskeletal:  Negative for back pain and neck pain.  Skin:  Negative for wound.  Neurological:  Positive for dizziness, weakness and light-headedness. Negative for seizures, speech difficulty and headaches.  Psychiatric/Behavioral:  Negative for confusion.     Physical Exam Updated Vital Signs BP Marland Kitchen)  141/65   Pulse 66   Temp 98.7 F (37.1 C)   Resp 18   SpO2 98%  Physical Exam Vitals and nursing note reviewed.  Constitutional:      Appearance: Normal appearance. He is well-developed.  HENT:     Head: Atraumatic.     Comments: No sts. No contusion noted. No sinus or temporal tenderness.     Right Ear: Tympanic membrane normal.     Left Ear: Tympanic membrane normal.     Nose: Nose normal.     Mouth/Throat:     Mouth: Mucous  membranes are moist.     Pharynx: Oropharynx is clear.  Eyes:     General: No scleral icterus.    Conjunctiva/sclera: Conjunctivae normal.     Pupils: Pupils are equal, round, and reactive to light.  Neck:     Vascular: No carotid bruit.     Trachea: No tracheal deviation.     Comments: No stiffness or rigidity Cardiovascular:     Rate and Rhythm: Normal rate and regular rhythm.     Pulses: Normal pulses.     Heart sounds: Normal heart sounds. No murmur heard.    No friction rub. No gallop.  Pulmonary:     Effort: Pulmonary effort is normal. No accessory muscle usage or respiratory distress.     Breath sounds: Normal breath sounds.  Abdominal:     General: Bowel sounds are normal. There is no distension.     Palpations: Abdomen is soft. There is no mass.     Tenderness: There is no abdominal tenderness. There is no guarding.     Comments: No tenderness, no pulsatile mass.   Genitourinary:    Comments: No cva tenderness. Musculoskeletal:        General: No swelling or tenderness.     Cervical back: Normal range of motion and neck supple. No rigidity.     Right lower leg: No edema.     Left lower leg: No edema.  Skin:    General: Skin is warm and dry.     Findings: No rash.  Neurological:     Mental Status: He is alert.     Comments: Alert, speech clear. No dysarthria or aphasia. Motor/sens grossly intact bil, stre 5/5. Steady gait.  Psychiatric:        Mood and Affect: Mood normal.     ED Results / Procedures / Treatments   Labs (all labs ordered are listed, but only abnormal results are displayed) Results for orders placed or performed during the hospital encounter of 05/13/23  Resp panel by RT-PCR (RSV, Flu A&B, Covid) Anterior Nasal Swab   Collection Time: 05/13/23 12:44 PM   Specimen: Anterior Nasal Swab  Result Value Ref Range   SARS Coronavirus 2 by RT PCR NEGATIVE NEGATIVE   Influenza A by PCR NEGATIVE NEGATIVE   Influenza B by PCR NEGATIVE NEGATIVE   Resp  Syncytial Virus by PCR NEGATIVE NEGATIVE  Basic metabolic panel   Collection Time: 05/13/23 12:44 PM  Result Value Ref Range   Sodium 139 135 - 145 mmol/L   Potassium 4.6 3.5 - 5.1 mmol/L   Chloride 105 98 - 111 mmol/L   CO2 30 22 - 32 mmol/L   Glucose, Bld 107 (H) 70 - 99 mg/dL   BUN 12 8 - 23 mg/dL   Creatinine, Ser 1.32 0.61 - 1.24 mg/dL   Calcium 9.4 8.9 - 44.0 mg/dL   GFR, Estimated >10 >27 mL/min   Anion  gap 4 (L) 5 - 15  CBC   Collection Time: 05/13/23 12:44 PM  Result Value Ref Range   WBC 5.8 4.0 - 10.5 K/uL   RBC 5.62 4.22 - 5.81 MIL/uL   Hemoglobin 15.7 13.0 - 17.0 g/dL   HCT 66.4 40.3 - 47.4 %   MCV 88.3 80.0 - 100.0 fL   MCH 27.9 26.0 - 34.0 pg   MCHC 31.7 30.0 - 36.0 g/dL   RDW 25.9 56.3 - 87.5 %   Platelets 229 150 - 400 K/uL   nRBC 0.0 0.0 - 0.2 %  Troponin I (High Sensitivity)   Collection Time: 05/13/23 12:44 PM  Result Value Ref Range   Troponin I (High Sensitivity) 7 <18 ng/L  Urinalysis, Routine w reflex microscopic -Urine, Clean Catch   Collection Time: 05/13/23  3:23 PM  Result Value Ref Range   Color, Urine YELLOW YELLOW   APPearance CLEAR CLEAR   Specific Gravity, Urine 1.015 1.005 - 1.030   pH 7.0 5.0 - 8.0   Glucose, UA NEGATIVE NEGATIVE mg/dL   Hgb urine dipstick NEGATIVE NEGATIVE   Bilirubin Urine NEGATIVE NEGATIVE   Ketones, ur NEGATIVE NEGATIVE mg/dL   Protein, ur NEGATIVE NEGATIVE mg/dL   Nitrite NEGATIVE NEGATIVE   Leukocytes,Ua NEGATIVE NEGATIVE      EKG EKG Interpretation Date/Time:  Monday May 13 2023 12:49:30 EDT Ventricular Rate:  67 PR Interval:  204 QRS Duration:  110 QT Interval:  388 QTC Calculation: 409 R Axis:   -83  Text Interpretation: Normal sinus rhythm Left anterior fascicular block Non-specific ST-t changes Confirmed by Cathren Laine ([id number]) on 05/13/2023 12:55:19 PM  Radiology DG Chest 2 View Result Date: 05/13/2023 CLINICAL DATA:  Cough EXAM: CHEST - 2 VIEW COMPARISON:  02/27/2022 FINDINGS: The heart  size and mediastinal contours are stable. Aortic atherosclerosis. No focal airspace consolidation, pleural effusion, or pneumothorax. Calcified pleural plaque at the left lung base. The visualized skeletal structures are unremarkable. IMPRESSION: No active cardiopulmonary disease. Electronically Signed   By: Duanne Guess D.O.   On: 05/13/2023 15:41   CT HEAD WO CONTRAST Result Date: 05/13/2023 CLINICAL DATA:  Trauma, possible syncope and fall. EXAM: CT HEAD WITHOUT CONTRAST TECHNIQUE: Contiguous axial images were obtained from the base of the skull through the vertex without intravenous contrast. RADIATION DOSE REDUCTION: This exam was performed according to the departmental dose-optimization program which includes automated exposure control, adjustment of the mA and/or kV according to patient size and/or use of iterative reconstruction technique. COMPARISON:  02/27/2022 FINDINGS: Brain: No acute intracranial hemorrhage. No CT evidence of acute infarct. Nonspecific hypoattenuation in the periventricular and subcortical white matter favored to reflect chronic microvascular ischemic changes. No edema, mass effect, or midline shift. The basilar cisterns are patent. Ventricles: Prominence of the ventricles suggesting underlying parenchymal volume loss. Vascular: Atherosclerotic calcifications of the carotid siphons. No hyperdense vessel. Skull: No acute or aggressive finding. Orbits: Orbits are symmetric. Sinuses: Mucosal thickening in the paranasal sinuses with near-complete opacification of the right sphenoid sinus slightly increased. Slight decrease in mucosal thickening in the left maxillary sinus. Thickening of the sinus walls suggestive of mucoperiosteal reaction. Overall improved aeration of the right maxillary sinus compared to prior. Other: Mastoid air cells are clear. Degenerative changes of the left temporomandibular joint. IMPRESSION: No CT evidence of acute intracranial abnormality. Chronic  microvascular ischemic changes and parenchymal volume loss, similar to prior. Paranasal sinus disease as above, improved in the right maxillary sinus and slightly increased in the right sphenoid  sinus. Electronically Signed   By: Emily Filbert M.D.   On: 05/13/2023 15:00    Procedures Procedures    Medications Ordered in ED Medications - No data to display  ED Course/ Medical Decision Making/ A&P                                 Medical Decision Making Problems Addressed: Syncope, unspecified syncope type: acute illness or injury with systemic symptoms that poses a threat to life or bodily functions URI with cough and congestion: acute illness or injury with systemic symptoms  Amount and/or Complexity of Data Reviewed Independent Historian:     Details: Family, hx External Data Reviewed: notes. Labs: ordered. Decision-making details documented in ED Course. Radiology: ordered and independent interpretation performed. Decision-making details documented in ED Course. ECG/medicine tests: ordered and independent interpretation performed. Decision-making details documented in ED Course.  Risk Decision regarding hospitalization.   Iv ns. Continuous pulse ox and cardiac monitoring. Labs ordered/sent. Imaging ordered.   Differential diagnosis includes syncope, anemia, dehydration, viral syndrome, etc. Dispo decision including potential need for admission considered - will get labs and imaging and reassess.   Reviewed nursing notes and prior charts for additional history. External reports reviewed. Additional history from: family.  Po fluids.   Cardiac monitor: sinus rhythm, rate 70.  Labs reviewed/interpreted by me - wbc and hgb normal. Trop normal. Covid and flu neg.   Xrays reviewed/interpreted by me - no pna.   CT reviewed/interpreted by me - no hem.   Food/fluids, ambulate in hall.  Pt ambulates in hall w steady gait. No faintness.   Recheck, no faintness or dizziness. No  pain or sob.   Pt currently appears stable for d/c.   Rec close pcp f/u.  Return precautions provided.          Final Clinical Impression(s) / ED Diagnoses Final diagnoses:  Syncope, unspecified syncope type  URI with cough and congestion    Rx / DC Orders ED Discharge Orders     None         Cathren Laine, MD 05/13/23 [phone]

## 2023-05-13 NOTE — ED Notes (Signed)
Spoke with lab about troponin add-on.

## 2023-05-13 NOTE — ED Notes (Signed)
 Pt given multiple snack and offered fluids to drink. Pt tolerated PO intake appropriately.

## 2023-05-14 ENCOUNTER — Telehealth: Payer: Self-pay

## 2023-05-14 DIAGNOSIS — R09A2 Foreign body sensation, throat: Secondary | ICD-10-CM | POA: Diagnosis not present

## 2023-05-14 NOTE — Transitions of Care (Post Inpatient/ED Visit) (Signed)
   05/14/2023  Name: Kevin Hale MRN: [id number] DOB: [birth year]  Today's TOC FU Call Status: Today's TOC FU Call Status:: Successful TOC FU Call Completed TOC FU Call Complete Date: 05/14/23 Patient's Name and Date of Birth confirmed.  Transition Care Management Follow-up Telephone Call Date of Discharge: 05/13/23 Discharge Facility: [facility] ([facility]) Type of Discharge: Emergency Department Reason for ED Visit: Other: (URI) How have you been since you were released from the hospital?: Better Any questions or concerns?: No  Items Reviewed: Did you receive and understand the discharge instructions provided?: Yes Medications obtained,verified, and reconciled?: Yes (Medications Reviewed) Any new allergies since your discharge?: No Dietary orders reviewed?: Yes Do you have support at home?: Yes People in Home: spouse  Medications Reviewed Today: Medications Reviewed Today     Reviewed by Karena Addison, LPN (Licensed Practical Nurse) on 05/14/23 at 1304  Med List Status: <None>   Medication Order Taking? Sig Documenting Provider Last Dose Status Informant  b complex vitamins tablet [id number] No Take 1 tablet by mouth daily. [provider] Taking Active   Cholecalciferol (VITAMIN D3) 1000 UNITS CAPS [id number] No Take 1 Int'l Units by mouth daily. [provider] Taking Active   ketoconazole (NIZORAL) 2 % cream [id number] No Apply 1 Application topically daily. Edwin Cap, DPM Taking Active   losartan (COZAAR) 25 MG tablet [id number] No Take 1 tablet (25 mg total) by mouth 2 (two) times daily. Pricilla Riffle, MD Taking Active   simvastatin (ZOCOR) 20 MG tablet [id number] No TAKE 1 TABLET BY MOUTH AT BEDTIME Corwin Levins, MD Taking Active   triamcinolone cream (KENALOG) 0.1 % [id number]  Apply 1 Application topically 2 (two) times daily. Corwin Levins, MD  Active   XARELTO 20 MG TABS tablet [id number] No TAKE 1 TABLET BY MOUTH ONCE DAILY WITH SUPPER Pricilla Riffle, MD Taking Active             Home Care and Equipment/Supplies: Were Home Health Services Ordered?: NA Any new equipment or medical supplies ordered?: NA  Functional Questionnaire: Do you need assistance with bathing/showering or dressing?: No Do you need assistance with meal preparation?: No Do you need assistance with eating?: No Do you have difficulty maintaining continence: No Do you need assistance with getting out of bed/getting out of a chair/moving?: No Do you have difficulty managing or taking your medications?: No  Follow up appointments reviewed: PCP Follow-up appointment confirmed?: No (declined appt) MD Provider Line Number:[phone] Given: No Specialist Hospital Follow-up appointment confirmed?: NA Do you need transportation to your follow-up appointment?: No Do you understand care options if your condition(s) worsen?: Yes-patient verbalized understanding    SIGNATURE Karena Addison, LPN [facility] Nurse Health Advisor Direct Dial [phone]

## 2023-05-15 NOTE — Progress Notes (Unsigned)
 Cardiology Office Note    Patient Name: Kevin Hale Date of Encounter: 05/15/2023  Primary Care Provider:  Corwin Levins, MD Primary Cardiologist:  Dietrich Pates, MD Primary Electrophysiologist: None   Past Medical History    Past Medical History:  Diagnosis Date   Anxiety    Atherosclerosis of abdominal aorta (HCC) 11/12/2011   BPH (benign prostatic hyperplasia)    Bronchiectasis (HCC) 03/26/2018   Cancer of prostate (HCC)    Chronic anticoagulation 09/12/2016   Coronary artery calcification seen on CT scan 03/26/2018   Diverticulosis of colon    DJD (degenerative joint disease)    left foot, "pt denies"   Glucose intolerance (impaired glucose tolerance) 11/03/2010   History of colon polyps    History of DVT (deep vein thrombosis)    History of pulmonary embolism    HTN (hypertension)    Hypercholesteremia    Lumbar degenerative disc disease 11/12/2011   Plantar fasciitis    Sleep apnea     History of Present Illness  Kevin Hale is a 83 y.o. 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  Kevin Hale has been followed by Dr. Tenny Craw since 2012 when he was seen for complaint of hypotension.  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  He was last seen by Dr. Tenny Craw on 01/02/2022 and during visit patient was doing well from a cardiac standpoint.  He had just recovered from an upper respiratory infection and his blood pressure was a little elevated and patient was advised to keep a log.  He was admitted on 02/27/2022 with complaint of loss of consciousness and was diagnosed with influenza A.  He was seen in the ED on 05/13/2023 with complaint of dizziness, weakness and never loss consciousness.  He underwent evaluation with EKG showing sinus rhythm and normal white blood count with normal troponins.   Checks x-ray showed no evidence of PNA and patient was discharged in stable condition.  Kevin Hale presents today for post hospital follow-up.  He reports no recent episodes of dizziness or weakness. He reports no LOC or fainting during ED visit.  The patient reports feeling well since the episode and denies any recurrence of dizziness. The patient also reports a persistent cough and a sensation of a foreign body in the throat for about two years. Despite multiple evaluations, including a gastroenterology consultation and an endoscopy, no cause has been identified for these symptoms. The patient also reports a history of flu in January but denies any residual symptoms from the illness. The patient's blood pressure and heart rate are well controlled on current medications, and the patient denies any medication-related issues.  Patient's blood pressure is stable at 122/68 and heart rate is 76 bpm. Patient denies chest pain, palpitations, dyspnea, PND, orthopnea, nausea, vomiting, dizziness, syncope, edema, weight gain, or early satiety.  Review of Systems  Please see the history of present illness.    All other systems reviewed and are otherwise negative except as noted above.  Physical Exam    Wt Readings from Last 3 Encounters:  04/25/23 196 lb (88.9 kg)  03/01/23 195 lb 3.2 oz (88.5 kg)  12/05/22 193 lb 12.8 oz (87.9 kg)   ZO:XWRUE were no vitals filed for this visit.,There is no height or weight on file to calculate BMI. GEN:  Well nourished, well developed in no acute distress Neck: No JVD; No carotid bruits Pulmonary: Clear to auscultation without rales, wheezing or rhonchi  Cardiovascular: Normal rate. Regular rhythm. Normal S1. Normal S2.   Murmurs: There is no murmur.  ABDOMEN: Soft, non-tender, non-distended EXTREMITIES:  No edema; No deformity   EKG/LABS/ Recent Cardiac Studies   ECG personally reviewed by me today -none completed today  Risk Assessment/Calculations:           Lab Results  Component Value Date   WBC 5.8 05/13/2023   HGB 15.7 05/13/2023   HCT 49.6 05/13/2023   MCV 88.3 05/13/2023   PLT 229 05/13/2023   Lab Results  Component Value Date   CREATININE 1.14 05/13/2023   BUN 12 05/13/2023   NA 139 05/13/2023   K 4.6 05/13/2023   CL 105 05/13/2023   CO2 30 05/13/2023   Lab Results  Component Value Date   CHOL 105 04/25/2023   HDL 44.60 04/25/2023   LDLCALC 45 04/25/2023   TRIG 77.0 04/25/2023   CHOLHDL 2 04/25/2023    Lab Results  Component Value Date   HGBA1C 5.7 04/25/2023   Assessment & Plan    1.  History of DVT: -On Xarelto for anticoagulation with no reported issues. -Patient's creatinine clearance is - Continue Xarelto 20 mg as prescribed.  2.  Aortic atherosclerosis: -Pressure well-controlled on current regimen. -Continue Zocor 20 mg -Patient advised to continue low-sodium heart healthy diet.  3.  Hyperlipidemia: -Cholesterol and glucose levels are within normal limits. - Maintain hydration and nutrition. - Monitor cholesterol and glucose levels as per primary care. -Continue Zocor 20 mg daily  4.  Essential hypertension: -Blood pressure today was stable at 122/68 -Continue losartan 25 mg twice daily  5.Chronic cough: -Patient reports a  sensation of foreign body in throat for two years. Evaluated by gastroenterology and ENT with no significant findings. Managed with omeprazole for possible GERD. - Continue omeprazole before meals. - Follow up with ENT and gastroenterology as needed. - Contact ENT in one month with an update.  Disposition: Follow-up with Dietrich Pates, MD or APP in 12 months   Signed, Napoleon Form, Leodis Rains, NP 05/15/2023, 9:51 AM [facility]

## 2023-05-16 ENCOUNTER — Encounter: Payer: Self-pay | Admitting: Nurse Practitioner

## 2023-05-16 ENCOUNTER — Ambulatory Visit: Payer: Medicare Other | Attending: Nurse Practitioner | Admitting: Nurse Practitioner

## 2023-05-16 VITALS — BP 122/68 | HR 76 | Ht 69.0 in | Wt 193.0 lb

## 2023-05-16 DIAGNOSIS — Z86718 Personal history of other venous thrombosis and embolism: Secondary | ICD-10-CM

## 2023-05-16 DIAGNOSIS — E785 Hyperlipidemia, unspecified: Secondary | ICD-10-CM

## 2023-05-16 DIAGNOSIS — R053 Chronic cough: Secondary | ICD-10-CM | POA: Diagnosis not present

## 2023-05-16 DIAGNOSIS — I10 Essential (primary) hypertension: Secondary | ICD-10-CM | POA: Diagnosis not present

## 2023-05-16 DIAGNOSIS — I700 Atherosclerosis of aorta: Secondary | ICD-10-CM | POA: Diagnosis not present

## 2023-05-16 NOTE — Patient Instructions (Signed)
 Medication Instructions:  Your physician recommends that you continue on your current medications as directed. Please refer to the Current Medication list given to you today.  *If you need a refill on your cardiac medications before your next appointment, please call your pharmacy*   Lab Work: None ordered   If you have labs (blood work) drawn today and your tests are completely normal, you will receive your results only by: MyChart Message (if you have MyChart) OR A paper copy in the mail If you have any lab test that is abnormal or we need to change your treatment, we will call you to review the results.   Testing/Procedures: None ordered    Follow-Up: At [facility], you and your health needs are our priority.  As part of our continuing mission to provide you with exceptional heart care, we have created designated Provider Care Teams.  These Care Teams include your primary Cardiologist (physician) and Advanced Practice Providers (APPs -  Physician Assistants and Nurse Practitioners) who all work together to provide you with the care you need, when you need it.  We recommend signing up for the patient portal called "MyChart".  Sign up information is provided on this After Visit Summary.  MyChart is used to connect with patients for Virtual Visits (Telemedicine).  Patients are able to view lab/test results, encounter notes, upcoming appointments, etc.  Non-urgent messages can be sent to your provider as well.   To learn more about what you can do with MyChart, go to [email or web address].    Your next appointment:   12 month(s)  Provider:   Dietrich Pates, MD     Other Instructions

## 2023-05-26 ENCOUNTER — Other Ambulatory Visit: Payer: Self-pay | Admitting: Internal Medicine

## 2023-05-27 ENCOUNTER — Other Ambulatory Visit: Payer: Self-pay

## 2023-05-27 MED ORDER — LOSARTAN POTASSIUM 25 MG PO TABS: 25.0000 mg | ORAL_TABLET | Freq: Two times a day (BID) | ORAL | 3 refills | Status: AC

## 2023-06-07 ENCOUNTER — Ambulatory Visit (HOSPITAL_COMMUNITY)
Admission: RE | Admit: 2023-06-07 | Discharge: 2023-06-07 | Disposition: A | Discharge status: Home / Self Care | Source: Ambulatory Visit | Attending: Internal Medicine | Admitting: Internal Medicine

## 2023-06-07 DIAGNOSIS — M79604 Pain in right leg: Secondary | ICD-10-CM | POA: Diagnosis not present

## 2023-06-07 DIAGNOSIS — M79605 Pain in left leg: Secondary | ICD-10-CM | POA: Diagnosis not present

## 2023-06-09 LAB — VAS US ABI WITH/WO TBI
Left ABI: 1.14
Right ABI: 1.12

## 2023-06-10 ENCOUNTER — Encounter: Payer: Self-pay | Admitting: Internal Medicine

## 2023-07-18 ENCOUNTER — Encounter (HOSPITAL_BASED_OUTPATIENT_CLINIC_OR_DEPARTMENT_OTHER): Payer: Self-pay | Admitting: Emergency Medicine

## 2023-07-18 ENCOUNTER — Other Ambulatory Visit: Payer: Self-pay

## 2023-07-18 ENCOUNTER — Emergency Department (HOSPITAL_BASED_OUTPATIENT_CLINIC_OR_DEPARTMENT_OTHER)
Admission: EM | Admit: 2023-07-18 | Discharge: 2023-07-18 | Disposition: A | Discharge status: Home / Self Care | Attending: Emergency Medicine | Admitting: Emergency Medicine

## 2023-07-18 ENCOUNTER — Emergency Department (HOSPITAL_BASED_OUTPATIENT_CLINIC_OR_DEPARTMENT_OTHER)

## 2023-07-18 DIAGNOSIS — I82551 Chronic embolism and thrombosis of right peroneal vein: Secondary | ICD-10-CM | POA: Diagnosis not present

## 2023-07-18 DIAGNOSIS — I825Y1 Chronic embolism and thrombosis of unspecified deep veins of right proximal lower extremity: Secondary | ICD-10-CM

## 2023-07-18 DIAGNOSIS — Z7901 Long term (current) use of anticoagulants: Secondary | ICD-10-CM | POA: Insufficient documentation

## 2023-07-18 DIAGNOSIS — I82441 Acute embolism and thrombosis of right tibial vein: Secondary | ICD-10-CM | POA: Diagnosis not present

## 2023-07-18 DIAGNOSIS — R600 Localized edema: Secondary | ICD-10-CM | POA: Diagnosis not present

## 2023-07-18 DIAGNOSIS — I82531 Chronic embolism and thrombosis of right popliteal vein: Secondary | ICD-10-CM | POA: Insufficient documentation

## 2023-07-18 DIAGNOSIS — I82401 Acute embolism and thrombosis of unspecified deep veins of right lower extremity: Secondary | ICD-10-CM | POA: Diagnosis not present

## 2023-07-18 DIAGNOSIS — I82511 Chronic embolism and thrombosis of right femoral vein: Secondary | ICD-10-CM | POA: Diagnosis not present

## 2023-07-18 DIAGNOSIS — R2241 Localized swelling, mass and lump, right lower limb: Secondary | ICD-10-CM | POA: Diagnosis present

## 2023-07-18 NOTE — ED Triage Notes (Signed)
 Pt c/o RLE pain with difficulty ambulating for > 1 month. Also reports foot pain. Reports having US  ~ 1 month ago to r/o DVT

## 2023-07-18 NOTE — ED Provider Notes (Signed)
 [place] EMERGENCY DEPARTMENT AT [facility] Provider Note   CSN: [id number] Arrival date & time: 07/18/23  1051     History  Chief Complaint  Patient presents with   Leg Pain    Kevin Hale is a 83 y.o. male.  HPI    83 year old male comes in with chief complaint of leg pain.  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  Patient states that has been on Xarelto  for several years now.  Over the last few weeks, patient has been having off-and-on swelling in his right leg.  He also has noticed some pain with exertion.  Patient states that he had Dopplers completed by his PCP, he was told they were normal.  However he continues to have discomfort in decided to come to the ER.  Home Medications Prior to Admission medications   Medication Sig Start Date End Date Taking? Authorizing Provider  b complex vitamins tablet Take 1 tablet by mouth daily.    [provider]  Cholecalciferol (VITAMIN D3) 1000 UNITS CAPS Take 1 Int'l Units by mouth daily.    [provider]  ketoconazole  (NIZORAL ) 2 % cream Apply 1 Application topically daily. 12/13/22   McDonald, Olive Better, DPM  losartan  (COZAAR ) 25 MG tablet Take 1 tablet (25 mg total) by mouth 2 (two) times daily. 05/27/23   Gerald Kitty., NP  simvastatin  (ZOCOR ) 20 MG tablet TAKE 1 TABLET BY MOUTH AT BEDTIME 01/14/23   Roslyn Coombe, MD  triamcinolone  cream (KENALOG ) 0.1 % Apply 1 Application topically 2 (two) times daily. 04/25/23 04/24/24  Roslyn Coombe, MD  XARELTO  20 MG TABS tablet TAKE 1 TABLET BY MOUTH ONCE DAILY WITH SUPPER 11/09/19   Elmyra Haggard, MD      Allergies    Pravastatin     Review of Systems   Review of Systems  All other systems reviewed and are negative.   Physical Exam Updated Vital Signs BP (!) 145/76   Pulse 83   Temp 98.3 F (36.8 C) (Oral)   Resp 16   Wt 88.9 kg   SpO2 96%   BMI 28.94 kg/m  Physical Exam Vitals and nursing note reviewed.   Constitutional:      Appearance: He is well-developed.  HENT:     Head: Atraumatic.  Eyes:     Extraocular Movements: Extraocular movements intact.     Pupils: Pupils are equal, round, and reactive to light.  Cardiovascular:     Rate and Rhythm: Normal rate.     Comments: 2+ dorsalis pedis noted bilaterally Pulmonary:     Effort: Pulmonary effort is normal.  Musculoskeletal:        General: Swelling and tenderness present.     Cervical back: Neck supple.     Right lower leg: Edema present.     Comments: Right lower extremity unilateral swelling with tenderness to palpation of the calf region and behind the knee.  Skin:    General: Skin is warm.  Neurological:     Mental Status: He is alert and oriented to person, place, and time.     ED Results / Procedures / Treatments   Labs (all labs ordered are listed, but only abnormal results are displayed) Labs Reviewed - No data to display  EKG None  Radiology US  Venous Img Lower Right (DVT Study) Result Date: 07/18/2023 CLINICAL DATA:  Right lower extremity edema for 1 month. History of prior right lower extremity DVT. EXAM:  RIGHT LOWER EXTREMITY VENOUS DOPPLER ULTRASOUND TECHNIQUE: Gray-scale sonography with graded compression, as well as color Doppler and duplex ultrasound were performed to evaluate the lower extremity deep venous systems from the level of the common femoral vein and including the common femoral, femoral, profunda femoral, popliteal and calf veins including the posterior tibial, peroneal and gastrocnemius veins when visible. The superficial great saphenous vein was also interrogated. Spectral Doppler was utilized to evaluate flow at rest and with distal augmentation maneuvers in the common femoral, femoral and popliteal veins. COMPARISON:  Report from a prior vascular lab study on 03/25/2017 FINDINGS: Contralateral Common Femoral Vein: Respiratory phasicity is normal and symmetric with the symptomatic side. No evidence  of thrombus. Normal compressibility. Common Femoral Vein: No evidence of thrombus. Normal compressibility, respiratory phasicity and response to augmentation. Saphenofemoral Junction: No evidence of thrombus. Normal compressibility and flow on color Doppler imaging. Profunda Femoral Vein: No evidence of thrombus. Normal compressibility and flow on color Doppler imaging. Femoral Vein: Nonocclusive mural thrombus in the mid to distal right femoral vein has the appearance of chronic thrombus and was described in 2019. Popliteal Vein: Nonocclusive mural thrombus in the popliteal vein has the appearance of chronic thrombus and was described in 2019. Calf Veins: There is some thrombus in some visualized segments of posterior tibial and peroneal veins. This may also be chronic given findings elsewhere in the right lower extremity. Superficial Great Saphenous Vein: No evidence of thrombus. Normal compressibility. Venous Reflux:  None. Other Findings: No evidence of superficial thrombophlebitis or abnormal fluid collection. IMPRESSION: Nonocclusive mural thrombus in the mid to distal right femoral vein, popliteal vein and some visualized segments of posterior tibial and peroneal veins. This has the appearance of chronic thrombus and was described in 2019. Electronically Signed   By: Erica Hau M.D.   On: 07/18/2023 13:48    Procedures Procedures    Medications Ordered in ED Medications - No data to display  ED Course/ Medical Decision Making/ A&P                                 Medical Decision Making  This patient presents to the ED with chief complaint(s) of right leg pain and swelling with pertinent past medical history of DVT/PE currently on Xarelto , CAD, questionable PAD.The complaint involves an extensive differential diagnosis and also carries with it a high risk of complications and morbidity.    The differential diagnosis includes : Acute on chronic DVT, lymphedema, Baker's cyst. Clinically,  there is no signs of infection.  Patient has 2+ dorsalis pedis, exam not consistent with arterial insufficiency.  I have reviewed patient's records.  It appears that more recently he had arterial Doppler done by PCP.  They did not show any concerning findings.  The initial plan is to get basic labs and ultrasound DVT.   Additional history obtained: Records reviewed Primary Care Documents   Treatment and Reassessment: Patient's ultrasound shows chronic DVT. However, he is having pain and intermittent swelling, which begs the question if he is not symptomatic.  I am also little concerned that patient has been on Xarelto  for such a long time, but his ultrasound today is unchanged compared to the ultrasound he had a few years back that revealed similar clot.  Given this, I have requested patient to follow-up with his PCP about the ultrasound. PCP can decide if patient needs to be put on Eliquis or Lovenox instead of  Xarelto . PCP can also decide if patient needs vascular surgery follow-up for this now symptomatic chronic clot. Other possibility, includes lymphedema.  Given patient's high concerns, I think it will be best for him to follow-up with PCP to get clarity.   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

## 2023-07-18 NOTE — Discharge Instructions (Addendum)
 The ultrasound shows that you have clots, they appear chronic and mostly unchanged. We recommend you see your primary care doctor for further evaluation as soon as possible.

## 2023-07-19 ENCOUNTER — Telehealth: Payer: Self-pay | Admitting: Internal Medicine

## 2023-07-19 ENCOUNTER — Encounter: Payer: Self-pay | Admitting: Family Medicine

## 2023-07-19 DIAGNOSIS — I700 Atherosclerosis of aorta: Secondary | ICD-10-CM

## 2023-07-19 DIAGNOSIS — I10 Essential (primary) hypertension: Secondary | ICD-10-CM

## 2023-07-19 DIAGNOSIS — Z79899 Other long term (current) drug therapy: Secondary | ICD-10-CM

## 2023-07-19 DIAGNOSIS — Z86718 Personal history of other venous thrombosis and embolism: Secondary | ICD-10-CM

## 2023-07-19 NOTE — Telephone Encounter (Signed)
 Pt called in stating he was in the ER and wants to know what he should do moving forward, if there are any medications that needs to be changed or any other test that need to be ordered. Please advise.

## 2023-07-19 NOTE — Telephone Encounter (Signed)
 Patient would like for you to review ED notes and see what you recommends and what are next steps moving forward.

## 2023-07-19 NOTE — Telephone Encounter (Signed)
 Error

## 2023-07-23 ENCOUNTER — Telehealth: Payer: Self-pay | Admitting: Internal Medicine

## 2023-07-23 DIAGNOSIS — G4733 Obstructive sleep apnea (adult) (pediatric): Secondary | ICD-10-CM

## 2023-07-23 DIAGNOSIS — I700 Atherosclerosis of aorta: Secondary | ICD-10-CM

## 2023-07-23 DIAGNOSIS — R053 Chronic cough: Secondary | ICD-10-CM

## 2023-07-23 DIAGNOSIS — Z79899 Other long term (current) drug therapy: Secondary | ICD-10-CM

## 2023-07-23 DIAGNOSIS — I10 Essential (primary) hypertension: Secondary | ICD-10-CM

## 2023-07-23 DIAGNOSIS — E785 Hyperlipidemia, unspecified: Secondary | ICD-10-CM

## 2023-07-23 NOTE — Telephone Encounter (Signed)
 Spoke with pt, aware dr Avanell Bob is not back in the office until Thursday this week. He is elevating hius legs when he is able. He is wearing his compression hose and taking tylenol  which he says is helping with his pain. Aware we will contact him once dr Avanell Bob reviews.

## 2023-07-23 NOTE — Telephone Encounter (Signed)
 Pt c/o swelling/edema: STAT if pt has developed SOB within 24 hours  If swelling, where is the swelling located? Right leg  How much weight have you gained and in what time span? no  Have you gained 2 pounds in a day or 5 pounds in a week? no  Do you have a log of your daily weights (if so, list)? Does not have a log of his weights, normally stays around the same weight.  Are you currently taking a fluid pill? Taking Xarelto  but not taking a fluid pill  Are you currently SOB? no  Have you traveled recently in a car or plane for an extended period of time? No   Patient went to the ED on 05/22 for DVT. States his leg is in a lot of pain from the blood clot. States it feels like his right leg is getting a cramp. He would like to know what he needs to do, called in on 05/23 but has not heard anything regarding Dr. Avanell Bob' recommendations. Please advise.

## 2023-07-24 NOTE — Telephone Encounter (Signed)
 Pt needs to have  1  BMET and BNP, and CBC and TSH     It does not look like he had labs  2  Needs clinic follow up  3   Confirm that pt is not missing doses of Xarelto 

## 2023-07-25 ENCOUNTER — Other Ambulatory Visit: Payer: Self-pay

## 2023-07-25 DIAGNOSIS — I700 Atherosclerosis of aorta: Secondary | ICD-10-CM

## 2023-07-25 DIAGNOSIS — I10 Essential (primary) hypertension: Secondary | ICD-10-CM

## 2023-07-25 DIAGNOSIS — Z79899 Other long term (current) drug therapy: Secondary | ICD-10-CM

## 2023-07-25 DIAGNOSIS — Z86718 Personal history of other venous thrombosis and embolism: Secondary | ICD-10-CM

## 2023-07-25 NOTE — Telephone Encounter (Addendum)
 Left a message for the pt to call back.. holding appt for him 08/13/23:   Per Dr Avanell Bob:   Pt needs to have  1  BMET and BNP, and CBC and TSH     It does not look like he had labs  2  Needs clinic follow up  3   Confirm that pt is not missing doses of Xarelto       Labs ordered and released.

## 2023-07-25 NOTE — Telephone Encounter (Signed)
 See other open encounter.

## 2023-07-25 NOTE — Telephone Encounter (Signed)
 Left a message for the pt to call back.. I will hold an appt time for him on 08/13/23... labs have been ordered and released.   Need to ask about his Xarelto .

## 2023-08-01 ENCOUNTER — Telehealth: Payer: Self-pay | Admitting: Internal Medicine

## 2023-08-01 ENCOUNTER — Ambulatory Visit: Payer: Self-pay

## 2023-08-01 NOTE — Telephone Encounter (Signed)
 Opened triage encounter for pt refill request for simvastatin , see other nurse note from 6/5.

## 2023-08-01 NOTE — Telephone Encounter (Signed)
 Copied from CRM [id number]. Topic: Clinical - Medication Refill >> Aug 01, 2023  2:44 PM Trula Gable C wrote: Medication: simvastatin  (ZOCOR ) 20 MG tablet  Has the patient contacted their pharmacy? Yes (Agent: If no, request that the patient contact the pharmacy for the refill. If patient does not wish to contact the pharmacy document the reason why and proceed with request.) (Agent: If yes, when and what did the pharmacy advise?)  This is the patient's preferred pharmacy:  [facility] [address] [place] - [address] [place]  [address] Phone: [phone] Fax: [phone]  Is this the correct pharmacy for this prescription? Yes If no, delete pharmacy and type the correct one.   Has the prescription been filled recently? No  Is the patient out of the medication? Yes  Has the patient been seen for an appointment in the last year OR does the patient have an upcoming appointment? Yes  Can we respond through MyChart? Yes  Agent: Please be advised that Rx refills may take up to 3 business days. We ask that you follow-up with your pharmacy.

## 2023-08-01 NOTE — Telephone Encounter (Signed)
 Reason for Disposition . [1] Follow-up call to recent contact AND [2] information only call, no triage required  Answer Assessment - Initial Assessment Questions 1. REASON FOR CALL or QUESTION: "What is your reason for calling today?" or "How can I best help you?" or "What question do you have that I can help answer?"     Had called for refill on med, nurse called to inquire about refill, pt reporting that he no longer needs the refill, had called back for us  to "disregard" but not documented in pt chart. Pt confirms no further questions or concerns  Protocols used: Information Only Call - No Triage-A-AH

## 2023-08-01 NOTE — Telephone Encounter (Signed)
 Called pt to see about need for early refill, since script for zocor  sent in November 2024 for 90-day supply with 3 refills. Pt reporting that he had "called lady back and said to disregard" the refill request. Pt confirms he found another bottle. No further questions or concerns at this time.   Copied from CRM [id number]. Topic: Clinical - Medication Refill >> Aug 01, 2023  2:44 PM Trula Gable C wrote: Medication: simvastatin  (ZOCOR ) 20 MG tablet   Has the patient contacted their pharmacy? Yes (Agent: If no, request that the patient contact the pharmacy for the refill. If patient does not wish to contact the pharmacy document the reason why and proceed with request.) (Agent: If yes, when and what did the pharmacy advise?)   This is the patient's preferred pharmacy:  [facility] [address] [place] - [address] [place] [address] Phone: [phone] Fax: [phone]   Is this the correct pharmacy for this prescription? Yes If no, delete pharmacy and type the correct one.    Has the prescription been filled recently? No   Is the patient out of the medication? Yes   Has the patient been seen for an appointment in the last year OR does the patient have an upcoming appointment? Yes   Can we respond through MyChart? Yes   Agent: Please be advised that Rx refills may take up to 3 business days. We ask that you follow-up with your pharmacy.

## 2023-08-05 NOTE — Telephone Encounter (Addendum)
 Finally able to get in touch with the pt... he will have labs this week and see Dr Avanell Bob 08/13/23.Kevin AasAaron AasAaron Hale pt says that he is feeling better, pain has improved but still having some discomfort in his leg. He has not missed any doses of his Xarelto ... he is wearing compression stockings which have helped... no edema, no redness, or heat.

## 2023-08-05 NOTE — Addendum Note (Signed)
 Addended by: Alanna Alley on: 08/05/2023 03:04 PM   Modules accepted: Orders

## 2023-08-07 ENCOUNTER — Other Ambulatory Visit (HOSPITAL_COMMUNITY): Payer: Self-pay | Admitting: Internal Medicine

## 2023-08-07 DIAGNOSIS — R053 Chronic cough: Secondary | ICD-10-CM | POA: Diagnosis not present

## 2023-08-07 DIAGNOSIS — Z79899 Other long term (current) drug therapy: Secondary | ICD-10-CM | POA: Diagnosis not present

## 2023-08-07 DIAGNOSIS — R600 Localized edema: Secondary | ICD-10-CM | POA: Diagnosis not present

## 2023-08-07 DIAGNOSIS — I10 Essential (primary) hypertension: Secondary | ICD-10-CM | POA: Diagnosis not present

## 2023-08-07 DIAGNOSIS — E785 Hyperlipidemia, unspecified: Secondary | ICD-10-CM | POA: Diagnosis not present

## 2023-08-07 DIAGNOSIS — I700 Atherosclerosis of aorta: Secondary | ICD-10-CM | POA: Diagnosis not present

## 2023-08-08 ENCOUNTER — Ambulatory Visit: Payer: Self-pay | Admitting: Internal Medicine

## 2023-08-08 LAB — BASIC METABOLIC PANEL WITH GFR
BUN/Creatinine Ratio: 16 (ref 10–24)
BUN: 17 mg/dL (ref 8–27)
CO2: 20 mmol/L (ref 20–29)
Calcium: 9.5 mg/dL (ref 8.6–10.2)
Chloride: 104 mmol/L (ref 96–106)
Creatinine, Ser: 1.07 mg/dL (ref 0.76–1.27)
Glucose: 130 mg/dL — ABNORMAL HIGH (ref 70–99)
Potassium: 4.5 mmol/L (ref 3.5–5.2)
Sodium: 139 mmol/L (ref 134–144)
eGFR: 69 mL/min/1.73

## 2023-08-08 LAB — PRO B NATRIURETIC PEPTIDE: NT-Pro BNP: 141 pg/mL (ref 0–486)

## 2023-08-08 LAB — CBC
Hematocrit: 49.6 % (ref 37.5–51.0)
Hemoglobin: 15.6 g/dL (ref 13.0–17.7)
MCH: 28.3 pg (ref 26.6–33.0)
MCHC: 31.5 g/dL (ref 31.5–35.7)
MCV: 90 fL (ref 79–97)
Platelets: 227 x10E3/uL (ref 150–450)
RBC: 5.51 x10E6/uL (ref 4.14–5.80)
RDW: 11.8 % (ref 11.6–15.4)
WBC: 7.9 x10E3/uL (ref 3.4–10.8)

## 2023-08-08 LAB — TSH: TSH: 0.956 u[IU]/mL (ref 0.450–4.500)

## 2023-08-12 NOTE — Progress Notes (Unsigned)
 Cardiology Office Note   Date:  08/13/2023   ID:  Kevin Hale, DOB October 11, 1940, MRN [id number]  PCP:  Kevin Coombe, MD  Cardiologist:   Kevin Berger, MD   Pt presents for f/u of DVT?PE    History of Present Illness: Kevin Hale is a 83 y.o. male with a history of  RLE DVT and PE in the past   (remote, [place]) Echo in 2012 showed normal LVEF and RVEF    The pt was admitted in Jan 2024 with syncope   He was diagnosed with influenza A.   March 2025  Pt went to ER for dizziness, weakness Labs, CXR unremarkable   Pt sent home .   March 2025  Seen in clinic by Kevin Hale May 2025   Seen in ER for RLE pain   LE dopplers showed chronic DVT with  progression from previous scans     The pt denies dizziness   Breathing is OK    No CP    He does not take BP at home  Still has discomfort in RLE     Outpatient Medications Prior to Visit  Medication Sig Dispense Refill   b complex vitamins tablet Take 1 tablet by mouth daily.     Cholecalciferol (VITAMIN D3) 1000 UNITS CAPS Take 1 Int'l Units by mouth daily.     losartan  (COZAAR ) 25 MG tablet Take 1 tablet (25 mg total) by mouth 2 (two) times daily. 180 tablet 3   simvastatin  (ZOCOR ) 20 MG tablet TAKE 1 TABLET BY MOUTH AT BEDTIME 90 tablet 3   XARELTO  20 MG TABS tablet TAKE 1 TABLET BY MOUTH ONCE DAILY WITH SUPPER 90 tablet 0   ketoconazole  (NIZORAL ) 2 % cream Apply 1 Application topically daily. 60 g 2   triamcinolone  cream (KENALOG ) 0.1 % Apply 1 Application topically 2 (two) times daily. 60 g 1   No facility-administered medications prior to visit.     Allergies:   Pravastatin    Past Medical History:  Diagnosis Date   Anxiety    Atherosclerosis of abdominal aorta (HCC) 11/12/2011   BPH (benign prostatic hyperplasia)    Bronchiectasis (HCC) 03/26/2018   Cancer of prostate (HCC)    Chronic anticoagulation 09/12/2016   Coronary artery calcification seen on CT scan 03/26/2018   Diverticulosis of colon    DJD (degenerative joint  disease)    left foot, pt denies   Glucose intolerance (impaired glucose tolerance) 11/03/2010   History of colon polyps    History of DVT (deep vein thrombosis)    History of pulmonary embolism    HTN (hypertension)    Hypercholesteremia    Lumbar degenerative disc disease 11/12/2011   Plantar fasciitis    Sleep apnea     Past Surgical History:  Procedure Laterality Date   INSERTION PROSTATE RADIATION SEED     UVULECTOMY       Social History:  The patient  reports that he has been smoking cigarettes. He has never used smokeless tobacco. He reports that he does not drink alcohol and does not use drugs.   Family History:  The patient's family history includes Deep vein thrombosis in his mother; Diabetes in his mother; Healthy in his brother and brother.    ROS:  Please see the history of present illness. All other systems are reviewed and  Negative to the above problem except as noted.    PHYSICAL EXAM: VS:  BP 138/79 (BP Location: Left Arm, Patient Position:  Sitting, Cuff Size: Normal)   Pulse 92   Resp 16   Ht 5' 9 (1.753 m)   Wt 195 lb 9.6 oz (88.7 kg)   SpO2 98%   BMI 28.89 kg/m   GEN: Overweight 83 yo  in no acute distress  HEENT: normal  Neck: no JVD, Cardiac: RRR; no murmurs Respiratory:  Rhonchi initially that clears with cough    GI: soft, nontender, nondistended, + BS  Ext    Pt wearing support socks  Tr edema   EKG:  EKG not done today   Lipid Panel    Component Value Date/Time   CHOL 105 04/25/2023 1053   TRIG 77.0 04/25/2023 1053   HDL 44.60 04/25/2023 1053   CHOLHDL 2 04/25/2023 1053   VLDL 15.4 04/25/2023 1053   LDLCALC 45 04/25/2023 1053   LDLCALC 45 09/30/2019 1205      Wt Readings from Last 3 Encounters:  08/13/23 195 lb 9.6 oz (88.7 kg)  07/18/23 196 lb (88.9 kg)  05/16/23 193 lb (87.5 kg)      ASSESSMENT AND PLAN: 1/  Hx of DVT/PE  PT seen in ER earlier this spring with pain in R leg   USN showed no progression, just chronic  DVT Will refer to [facility] for eval, recommendations given continued discomfort     2  HTN  BP is high  ON my check it was 150/76 (R arm)   He has a cuff at home somewhere    Will look for     I have asked him to take BP several times per week   Keep log    Bring in cuff and log to next clinic visit with Kevin Hale   3  PVOD  Atherosclerosis of aorta   4  HL   Continue  statin    LDL 45 in Feb 2024  5  ? Sleep apnea  Has CPAP  Encouraged him to use   6   Tob   Still smoking some  Counselled on quitting     F/U BP with [name]   Follow up in cardiology in 8 months      Stay active     Current medicines are reviewed at length with the patient today.  The patient does not have concerns regarding medicines.  Signed, Kevin Berger, MD  08/13/2023 9:57 PM    [facility] [address] [place], [place], [place]  [address] Phone: [phone]; Fax: [phone]

## 2023-08-13 ENCOUNTER — Encounter: Payer: Self-pay | Admitting: Internal Medicine

## 2023-08-13 ENCOUNTER — Ambulatory Visit: Attending: Internal Medicine | Admitting: Internal Medicine

## 2023-08-13 VITALS — BP 138/79 | HR 92 | Resp 16 | Ht 69.0 in | Wt 195.6 lb

## 2023-08-13 DIAGNOSIS — M79605 Pain in left leg: Secondary | ICD-10-CM | POA: Diagnosis not present

## 2023-08-13 DIAGNOSIS — Z86718 Personal history of other venous thrombosis and embolism: Secondary | ICD-10-CM | POA: Diagnosis not present

## 2023-08-13 DIAGNOSIS — M79604 Pain in right leg: Secondary | ICD-10-CM | POA: Diagnosis not present

## 2023-08-13 NOTE — Patient Instructions (Signed)
 Medication Instructions:   *If you need a refill on your cardiac medications before your next appointment, please call your pharmacy*  Lab Work:  If you have labs (blood work) drawn today and your tests are completely normal, you will receive your results only by: MyChart Message (if you have MyChart) OR A paper copy in the mail If you have any lab test that is abnormal or we need to change your treatment, we will call you to review the results.  Testing/Procedures:   Follow-Up: At [facility], you and your health needs are our priority.  As part of our continuing mission to provide you with exceptional heart care, our providers are all part of one team.  This team includes your primary Cardiologist (physician) and Advanced Practice Providers or APPs (Physician Assistants and Nurse Practitioners) who all work together to provide you with the care you need, when you need it.  Your next appointment:  8-9 months with Dr Avanell Bob  Referral to Vein and Vascular... they will call you to make the appointment.  Take your BP cuff and readings to your appt with Dr Autry Legions coming up.   . We recommend signing up for the patient portal called MyChart.  Sign up information is provided on this After Visit Summary.  MyChart is used to connect with patients for Virtual Visits (Telemedicine).  Patients are able to view lab/test results, encounter notes, upcoming appointments, etc.  Non-urgent messages can be sent to your provider as well.   To learn more about what you can do with MyChart, go to [email or web address].   Other Instructions

## 2023-08-27 ENCOUNTER — Ambulatory Visit

## 2023-08-28 ENCOUNTER — Ambulatory Visit: Attending: Vascular Surgery | Admitting: Physician Assistant

## 2023-08-28 VITALS — BP 154/75 | HR 79 | Temp 98.2°F | Ht 69.0 in | Wt 195.8 lb

## 2023-08-28 DIAGNOSIS — I739 Peripheral vascular disease, unspecified: Secondary | ICD-10-CM | POA: Diagnosis not present

## 2023-08-28 DIAGNOSIS — I82561 Chronic embolism and thrombosis of right calf muscular vein: Secondary | ICD-10-CM

## 2023-09-02 NOTE — Progress Notes (Signed)
 Office Note   History of Present Illness   Kevin Hale is a 83 y.o. (09-13-1940) male who presents for follow up of chronic right lower extremity DVT. He also has a history of chronic right leg pain that starts in his right buttock and radiates into the popliteal fossa and calf. This has been felt to be neurogenic or musculoskeletal in nature. He also has some asymptomatic right SFA stenosis.   The patient returns today for follow up. He says that his symptoms are about the same. He reports nearly constant, mild aching in the right popliteal fossa and calf. Sometimes his pain worsens and extends from the buttock down the leg. He cannot identify any aggravating or alleviating factors. He denies any claudication, rest pain, or tissue loss.   Past Medical History:  Diagnosis Date   Anxiety    Atherosclerosis of abdominal aorta (HCC) 11/12/2011   BPH (benign prostatic hyperplasia)    Bronchiectasis (HCC) 03/26/2018   Cancer of prostate (HCC)    Chronic anticoagulation 09/12/2016   Coronary artery calcification seen on CT scan 03/26/2018   Diverticulosis of colon    DJD (degenerative joint disease)    left foot, pt denies   Glucose intolerance (impaired glucose tolerance) 11/03/2010   History of colon polyps    History of DVT (deep vein thrombosis)    History of pulmonary embolism    HTN (hypertension)    Hypercholesteremia    Lumbar degenerative disc disease 11/12/2011   Plantar fasciitis    Sleep apnea     Past Surgical History:  Procedure Laterality Date   INSERTION PROSTATE RADIATION SEED     UVULECTOMY      Social History   Socioeconomic History   Marital status: Married    Spouse name: Not on file   Number of children: 2   Years of education: Not on file   Highest education level: Not on file  Occupational History   Occupation: retired  Tobacco Use   Smoking status: Some Days    Current packs/day: 0.50    Types: Cigarettes   Smokeless tobacco: Never   Vaping Use   Vaping status: Never Used  Substance and Sexual Activity   Alcohol use: No   Drug use: No   Sexual activity: Not Currently  Other Topics Concern   Not on file  Social History Narrative   Not on file   Social Drivers of Health   Financial Resource Strain: Low Risk  (11/12/2022)   Overall Financial Resource Strain (CARDIA)    Difficulty of Paying Living Expenses: Not hard at all  Food Insecurity: No Food Insecurity (11/12/2022)   Hunger Vital Sign    Worried About Running Out of Food in the Last Year: Never true    Ran Out of Food in the Last Year: Never true  Transportation Needs: No Transportation Needs (11/12/2022)   PRAPARE - Administrator, Civil Service (Medical): No    Lack of Transportation (Non-Medical): No  Physical Activity: Inactive (11/12/2022)   Exercise Vital Sign    Days of Exercise per Week: 0 days    Minutes of Exercise per Session: 0 min  Stress: No Stress Concern Present (11/12/2022)   [employer] of Occupational Health - Occupational Stress Questionnaire    Feeling of Stress : Not at all  Social Connections: Socially Isolated (11/12/2022)   Social Connection and Isolation Panel    Frequency of Communication with Friends  and Family: Once a week    Frequency of Social Gatherings with Friends and Family: Once a week    Attends Religious Services: Never    Database administrator or Organizations: No    Attends Banker Meetings: Never    Marital Status: Married  Catering manager Violence: Not At Risk (11/12/2022)   Humiliation, Afraid, Rape, and Kick questionnaire    Fear of Current or Ex-Partner: No    Emotionally Abused: No    Physically Abused: No    Sexually Abused: No   Family History  Problem Relation Age of Onset   Deep vein thrombosis Mother    Diabetes Mother    Healthy Brother    Healthy Brother    Colon cancer Neg Hx    Rectal cancer Neg Hx     Current Outpatient Medications  Medication Sig  Dispense Refill   b complex vitamins tablet Take 1 tablet by mouth daily.     Cholecalciferol (VITAMIN D3) 1000 UNITS CAPS Take 1 Int'l Units by mouth daily.     losartan  (COZAAR ) 25 MG tablet Take 1 tablet (25 mg total) by mouth 2 (two) times daily. 180 tablet 3   simvastatin  (ZOCOR ) 20 MG tablet TAKE 1 TABLET BY MOUTH AT BEDTIME 90 tablet 3   XARELTO  20 MG TABS tablet TAKE 1 TABLET BY MOUTH ONCE DAILY WITH SUPPER 90 tablet 0   omeprazole (PRILOSEC) 40 MG capsule SMARTSIG:1 Capsule(s) By Mouth Morning-Evening (Patient not taking: Reported on 08/28/2023)     No current facility-administered medications for this visit.    Allergies  Allergen Reactions   Pravastatin  Other (See Comments)    myalgia    REVIEW OF SYSTEMS (negative unless checked):   Cardiac:  []  Chest pain or chest pressure? []  Shortness of breath upon activity? []  Shortness of breath when lying flat? []  Irregular heart rhythm?  Vascular:  []  Pain in calf, thigh, or hip brought on by walking? []  Pain in feet at night that wakes you up from your sleep? []  Blood clot in your veins? []  Leg swelling?  Pulmonary:  []  Oxygen at home? []  Productive cough? []  Wheezing?  Neurologic:  []  Sudden weakness in arms or legs? []  Sudden numbness in arms or legs? []  Sudden onset of difficult speaking or slurred speech? []  Temporary loss of vision in one eye? []  Problems with dizziness?  Gastrointestinal:  []  Blood in stool? []  Vomited blood?  Genitourinary:  []  Burning when urinating? []  Blood in urine?  Psychiatric:  []  Major depression  Hematologic:  []  Bleeding problems? []  Problems with blood clotting?  Dermatologic:  []  Rashes or ulcers?  Constitutional:  []  Fever or chills?  Ear/Nose/Throat:  []  Change in hearing? []  Nose bleeds? []  Sore throat?  Musculoskeletal:  []  Back pain? []  Joint pain? []  Muscle pain?   Physical Examination     Vitals:   08/28/23 1005  BP: (!) 154/75  Pulse: 79   Temp: 98.2 F (36.8 C)  TempSrc: Temporal  SpO2: 96%  Weight: 195 lb 12.8 oz (88.8 kg)  Height: 5' 9 (1.753 m)   Body mass index is 28.91 kg/m.  General:  WDWN in NAD; vital signs documented above Gait: Not observed HENT: WNL, normocephalic Pulmonary: normal non-labored breathing  Cardiac: regular Abdomen: soft, NT, no masses Skin: without rashes Vascular Exam/Pulses: palpable DP pulses  Extremities: no lower extremity edema. No RLE tenderness to palpation Musculoskeletal: no muscle wasting or atrophy  Neurologic: A&O X 3;  No focal weakness or paresthesias are detected Psychiatric:  The pt has Normal affect.  Non-invasive Vascular Imaging   RLE DVT Study (07/18/2023):  Nonocclusive mural thrombus in the mid to distal right femoral vein, popliteal vein and some visualized segments of posterior tibial and peroneal veins. This has the appearance of chronic thrombus and was described in 2019.  ABIs (06/07/2023) +---------+------------------+-----+---------+--------+  Right   Rt Pressure (mmHg)IndexWaveform Comment   +---------+------------------+-----+---------+--------+  Brachial 138                    biphasic           +---------+------------------+-----+---------+--------+  ATA     140               1.01 triphasic          +---------+------------------+-----+---------+--------+  PTA     154               1.12 triphasic          +---------+------------------+-----+---------+--------+  PERO    150               1.09 triphasic          +---------+------------------+-----+---------+--------+  Great Toe120               0.87 Normal             +---------+------------------+-----+---------+--------+   +---------+------------------+-----+---------+-------+  Left    Lt Pressure (mmHg)IndexWaveform Comment  +---------+------------------+-----+---------+-------+  Brachial 129                    biphasic           +---------+------------------+-----+---------+-------+  ATA     146               1.06 triphasic         +---------+------------------+-----+---------+-------+  PTA     157               1.14 triphasic         +---------+------------------+-----+---------+-------+  PERO    142               1.03 triphasic         +---------+------------------+-----+---------+-------+  [place] Toe139               1.01 Normal            +---------+------------------+-----+---------+-------+   +-------+-----------+-----------+------------+------------+  ABI/TBIToday's ABIToday's TBIPrevious ABIPrevious TBI  +-------+-----------+-----------+------------+------------+  Right 1.12       0.87       1.06        0.80          +-------+-----------+-----------+------------+------------+  Left  1.14       1.01       0.90        0.90           Medical Decision Making   Kevin Hale is a 83 y.o. male who presents for evaluation of PAD and chronic RLE DVT  Based on the patient's RLE duplex, there is still chronic thrombus in the right femoral, popliteal, peroneal, and posterior tibial veins. This is unchanged from 2019 His ABIs are unchanged since his last visit. His right ABI is 1.12 and left ABI is 1.14 He reports continued, unchanged chronic right calf and popliteal fossa pain. This is likely neurogenic or musculoskeletal in nature. This does not appear to be due to PAD On exam he has palpable femoral and DP pulses  bilaterally He will be on Xarelto  lifelong due to several previous DVTs. He will continue his statin. He can follow up with our office in 1 year with repeat ABIs   Kevin Fallin SHAUNNA Holster, Kevin [facility] Office: [phone]  08/28/2023, 8:44 AM  Call MD: [place]

## 2023-09-11 ENCOUNTER — Other Ambulatory Visit: Payer: Self-pay

## 2023-09-11 ENCOUNTER — Emergency Department (HOSPITAL_BASED_OUTPATIENT_CLINIC_OR_DEPARTMENT_OTHER)
Admission: EM | Admit: 2023-09-11 | Discharge: 2023-09-11 | Disposition: A | Discharge status: Home / Self Care | Attending: Emergency Medicine | Admitting: Emergency Medicine

## 2023-09-11 ENCOUNTER — Emergency Department (HOSPITAL_BASED_OUTPATIENT_CLINIC_OR_DEPARTMENT_OTHER)

## 2023-09-11 DIAGNOSIS — B9789 Other viral agents as the cause of diseases classified elsewhere: Secondary | ICD-10-CM | POA: Diagnosis not present

## 2023-09-11 DIAGNOSIS — R059 Cough, unspecified: Secondary | ICD-10-CM | POA: Diagnosis present

## 2023-09-11 DIAGNOSIS — J069 Acute upper respiratory infection, unspecified: Secondary | ICD-10-CM | POA: Insufficient documentation

## 2023-09-11 DIAGNOSIS — Z7901 Long term (current) use of anticoagulants: Secondary | ICD-10-CM | POA: Diagnosis not present

## 2023-09-11 LAB — RESP PANEL BY RT-PCR (RSV, FLU A&B, COVID)  RVPGX2
Influenza A by PCR: NEGATIVE
Influenza B by PCR: NEGATIVE
Resp Syncytial Virus by PCR: NEGATIVE
SARS Coronavirus 2 by RT PCR: NEGATIVE

## 2023-09-11 MED ORDER — DOXYCYCLINE HYCLATE 100 MG PO TABS
100.0000 mg | ORAL_TABLET | Freq: Once | ORAL | Status: AC
  Administered 2023-09-11: 100 mg via ORAL
  Filled 2023-09-11: qty 1

## 2023-09-11 MED ORDER — DOXYCYCLINE HYCLATE 100 MG PO CAPS: 100.0000 mg | ORAL_CAPSULE | Freq: Two times a day (BID) | ORAL | 0 refills | Status: DC

## 2023-09-11 NOTE — ED Triage Notes (Signed)
 Pt via pov from home with cough and nasal congestion x 2 days. Pt states he feels tired and weak. Pt's wife has had a cold and is now getting over it. Pt alert & oriented, nad noted.

## 2023-09-11 NOTE — ED Provider Notes (Signed)
 [place] EMERGENCY DEPARTMENT AT [facility] Provider Note   CSN: [id number] Arrival date & time: 09/11/23  1756     Patient presents with: URI   Kevin Hale is a 83 y.o. male.   83 yo M with a chief complaints of cough congestion and fatigue.  Going on for a few days now.  His wife is sick with something similar but she started to get better and he has not improved yet.  He decided to come in to be evaluated.  He denies abdominal pain denies nausea vomiting or diarrhea.  Runny nose cough.   URI      Prior to Admission medications   Medication Sig Start Date End Date Taking? Authorizing Provider  doxycycline  (VIBRAMYCIN ) 100 MG capsule Take 1 capsule (100 mg total) by mouth 2 (two) times daily. One po bid x 7 days 09/11/23  Yes Emil Share, DO  b complex vitamins tablet Take 1 tablet by mouth daily.    [provider]  Cholecalciferol (VITAMIN D3) 1000 UNITS CAPS Take 1 Int'l Units by mouth daily.    [provider]  losartan  (COZAAR ) 25 MG tablet Take 1 tablet (25 mg total) by mouth 2 (two) times daily. 05/27/23   Wyn Jackee VEAR Mickey., NP  omeprazole (PRILOSEC) 40 MG capsule SMARTSIG:1 Capsule(s) By Mouth Morning-Evening Patient not taking: Reported on 08/28/2023 03/14/23   [provider]  simvastatin  (ZOCOR ) 20 MG tablet TAKE 1 TABLET BY MOUTH AT BEDTIME 01/14/23   Norleen Lynwood ORN, MD  XARELTO  20 MG TABS tablet TAKE 1 TABLET BY MOUTH ONCE DAILY WITH SUPPER 11/09/19   Okey Vina GAILS, MD    Allergies: Pravastatin     Review of Systems  Updated Vital Signs BP 138/67   Pulse (!) 101   Temp 99.4 F (37.4 C) (Oral)   Resp (!) 24   Ht 5' 9 (1.753 m)   Wt 88.8 kg   SpO2 94%   BMI 28.91 kg/m   Physical Exam Vitals and nursing note reviewed.  Constitutional:      Appearance: He is well-developed.  HENT:     Head: Normocephalic and atraumatic.  Eyes:     Pupils: Pupils are equal, round, and reactive to light.  Neck:     Vascular: No JVD.   Cardiovascular:     Rate and Rhythm: Normal rate and regular rhythm.     Heart sounds: No murmur heard.    No friction rub. No gallop.  Pulmonary:     Effort: No respiratory distress.     Breath sounds: No wheezing.  Abdominal:     General: There is no distension.     Tenderness: There is no abdominal tenderness. There is no guarding or rebound.  Musculoskeletal:        General: Normal range of motion.     Cervical back: Normal range of motion and neck supple.  Skin:    Coloration: Skin is not pale.     Findings: No rash.  Neurological:     Mental Status: He is alert and oriented to person, place, and time.  Psychiatric:        Behavior: Behavior normal.     (all labs ordered are listed, but only abnormal results are displayed) Labs Reviewed  RESP PANEL BY RT-PCR (RSV, FLU A&B, COVID)  [id number]    EKG: None  Radiology: [facility] Chest Port 1 View Result Date: 09/11/2023 CLINICAL DATA:  Cough, fever EXAM: PORTABLE CHEST 1 VIEW COMPARISON:  05/13/2023 FINDINGS: The heart size and mediastinal contours are within normal limits. Both lungs are clear. The visualized skeletal structures are unremarkable. IMPRESSION: No acute abnormality of the lungs in AP portable projection. Electronically Signed   By: Marolyn JONETTA Jaksch M.D.   On: 09/11/2023 18:36     Procedures   Medications Ordered in the ED  doxycycline  (VIBRA -TABS) tablet 100 mg (100 mg Oral Given 09/11/23 1848)                                    Medical Decision Making Amount and/or Complexity of Data Reviewed Radiology: ordered.  Risk Prescription drug management.   83 yo M with a chief complaints of cough congestion fever going on for a few days now.  Wife is sick with a similar illness.  He is well-appearing and nontoxic.  Appears well-hydrated.  Will obtain a plain film of the chest.  Chest x-ray independently interpreted by me without focal infiltrate or pneumothorax.  COVID flu and RSV are negative.  As patient is  concerned about worsening symptoms we will start on antibiotics.  PCP follow-up.  7:24 PM:  I have discussed the diagnosis/risks/treatment options with the patient and family.  Evaluation and diagnostic testing in the emergency department does not suggest an emergent condition requiring admission or immediate intervention beyond what has been performed at this time.  They will follow up with PCP. We also discussed returning to the ED immediately if new or worsening sx occur. We discussed the sx which are most concerning (e.g., sudden worsening pain, fever, inability to tolerate by mouth) that necessitate immediate return. Medications administered to the patient during their visit and any new prescriptions provided to the patient are listed below.  Medications given during this visit Medications  doxycycline  (VIBRA -TABS) tablet 100 mg (100 mg Oral Given 09/11/23 1848)     The patient appears reasonably screen and/or stabilized for discharge and I doubt any other medical condition or other [facility] requiring further screening, evaluation, or treatment in the ED at this time prior to discharge.       Final diagnoses:  Viral URI with cough    ED Discharge Orders          Ordered    doxycycline  (VIBRAMYCIN ) 100 MG capsule  2 times daily        09/11/23 1840               Emil Share, DO 09/11/23 1924

## 2023-09-11 NOTE — ED Notes (Signed)
 Reviewed AVS/discharge instruction with patient. Time allotted for and all questions answered. Patient is agreeable for d/c and escorted to ed exit by staff.

## 2023-09-11 NOTE — Discharge Instructions (Signed)
 Your xray looks okay.  Please follow up with your family doc in the office.  Return for worsening difficulty breathing or confusion.

## 2023-10-04 ENCOUNTER — Ambulatory Visit

## 2023-10-04 VITALS — Ht 69.0 in | Wt 195.0 lb

## 2023-10-04 DIAGNOSIS — Z0000 Encounter for general adult medical examination without abnormal findings: Secondary | ICD-10-CM | POA: Diagnosis not present

## 2023-10-04 NOTE — Progress Notes (Signed)
 Subjective:   Kevin Hale is a 83 y.o. who presents for a Medicare Wellness preventive visit.  As a reminder, Annual Wellness Visits don't include a physical exam, and some assessments may be limited, especially if this visit is performed virtually. We may recommend an in-person follow-up visit with your provider if needed.  Visit Complete: Virtual I connected with  [name] on 10/04/23 by a audio enabled telemedicine application and verified that I am speaking with the correct person using two identifiers.  Patient Location: Home  Provider Location: Office/Clinic  I discussed the limitations of evaluation and management by telemedicine. The patient expressed understanding and agreed to proceed.  Vital Signs: Because this visit was a virtual/telehealth visit, some criteria may be missing or patient reported. Any vitals not documented were not able to be obtained and vitals that have been documented are patient reported.  VideoDeclined- This patient declined Librarian, academic. Therefore the visit was completed with audio only.  Persons Participating in Visit: Patient.  AWV Questionnaire: No: Patient Medicare AWV questionnaire was not completed prior to this visit.  Cardiac Risk Factors include: advanced age (>63men, >55 women);hypertension     Objective:    Today's Vitals   10/04/23 1134  Weight: 195 lb (88.5 kg)  Height: 5' 9 (1.753 m)   Body mass index is 28.8 kg/m.     10/04/2023   11:34 AM 09/11/2023    6:05 PM 07/18/2023   11:00 AM 11/12/2022    9:21 AM 02/27/2022    2:27 PM 12/12/2021    1:52 PM 09/15/2021    9:11 AM  Advanced Directives  Does Patient Have a Medical Advance Directive? No No No Yes No No Yes  Type of Advance Directive    Living will   Healthcare Power of [place];Living will  Does patient want to make changes to medical advance directive?    No - Patient declined     Copy of Healthcare Power of Attorney in Chart?        No - copy requested  Would patient like information on creating a medical advance directive? No - Patient declined     No - Patient declined     Current Medications (verified) Outpatient Encounter Medications as of 10/04/2023  Medication Sig   b complex vitamins tablet Take 1 tablet by mouth daily.   Cholecalciferol (VITAMIN D3) 1000 UNITS CAPS Take 1 Int'l Units by mouth daily.   losartan  (COZAAR ) 25 MG tablet Take 1 tablet (25 mg total) by mouth 2 (two) times daily.   simvastatin  (ZOCOR ) 20 MG tablet TAKE 1 TABLET BY MOUTH AT BEDTIME   XARELTO  20 MG TABS tablet TAKE 1 TABLET BY MOUTH ONCE DAILY WITH SUPPER   doxycycline  (VIBRAMYCIN ) 100 MG capsule Take 1 capsule (100 mg total) by mouth 2 (two) times daily. One po bid x 7 days (Patient not taking: Reported on 10/04/2023)   omeprazole (PRILOSEC) 40 MG capsule SMARTSIG:1 Capsule(s) By Mouth Morning-Evening (Patient not taking: Reported on 10/04/2023)   No facility-administered encounter medications on file as of 10/04/2023.    Allergies (verified) Pravastatin    History: Past Medical History:  Diagnosis Date   Anxiety    Atherosclerosis of abdominal aorta (HCC) 11/12/2011   BPH (benign prostatic hyperplasia)    Bronchiectasis (HCC) 03/26/2018   Cancer of prostate (HCC)    Chronic anticoagulation 09/12/2016   Coronary artery calcification seen on CT scan 03/26/2018   Diverticulosis of colon    DJD (  degenerative joint disease)    left foot, pt denies   Glucose intolerance (impaired glucose tolerance) 11/03/2010   History of colon polyps    History of DVT (deep vein thrombosis)    History of pulmonary embolism    HTN (hypertension)    Hypercholesteremia    Lumbar degenerative disc disease 11/12/2011   Plantar fasciitis    Sleep apnea    Past Surgical History:  Procedure Laterality Date   INSERTION PROSTATE RADIATION SEED     UVULECTOMY     Family History  Problem Relation Age of Onset   Deep vein thrombosis Mother     Diabetes Mother    Healthy Brother    Healthy Brother    Colon cancer Neg Hx    Rectal cancer Neg Hx    Social History   Socioeconomic History   Marital status: Married    Spouse name: Not on file   Number of children: 2   Years of education: Not on file   Highest education level: Not on file  Occupational History   Occupation: retired  Tobacco Use   Smoking status: Former    Current packs/day: 0.50    Average packs/day: 1 pack/day for 64.6 years (64.4 ttl pk-yrs)    Types: Cigarettes    Start date: 02/27/1959   Smokeless tobacco: Never  Vaping Use   Vaping status: Never Used  Substance and Sexual Activity   Alcohol use: No   Drug use: No   Sexual activity: Yes  Other Topics Concern   Not on file  Social History Narrative   Not on file   Social Drivers of Health   Financial Resource Strain: Low Risk  (10/04/2023)   Overall Financial Resource Strain (CARDIA)    Difficulty of Paying Living Expenses: Not hard at all  Food Insecurity: No Food Insecurity (10/04/2023)   Hunger Vital Sign    Worried About Running Out of Food in the Last Year: Never true    Ran Out of Food in the Last Year: Never true  Transportation Needs: No Transportation Needs (10/04/2023)   PRAPARE - Administrator, Civil Service (Medical): No    Lack of Transportation (Non-Medical): No  Physical Activity: Insufficiently Active (10/04/2023)   Exercise Vital Sign    Days of Exercise per Week: 3 days    Minutes of Exercise per Session: 20 min  Stress: No Stress Concern Present (10/04/2023)   [employer] of Occupational Health - Occupational Stress Questionnaire    Feeling of Stress: Not at all  Social Connections: Moderately Isolated (10/04/2023)   Social Connection and Isolation Panel    Frequency of Communication with Friends and Family: More than three times a week    Frequency of Social Gatherings with Friends and Family: More than three times a week    Attends Religious Services: Never     Database administrator or Organizations: No    Attends Engineer, structural: Never    Marital Status: Married    Tobacco Counseling Counseling given: No    Clinical Intake:  Pre-visit preparation completed: Yes  Pain : No/denies pain     BMI - recorded: 28.8 Nutritional Status: BMI 25 -29 Overweight Nutritional Risks: None Diabetes: No  Lab Results  Component Value Date   HGBA1C 5.7 04/25/2023   HGBA1C 5.9 10/23/2022   HGBA1C 5.5 04/24/2022     How often do you need to have someone help you when you read instructions, pamphlets, or other  written materials from your doctor or pharmacy?: 1 - Never  Interpreter Needed?: No  Information entered by :: Verdie Saba, CMA   Activities of Daily Living     10/04/2023   11:39 AM 11/12/2022    9:10 AM  In your present state of health, do you have any difficulty performing the following activities:  Hearing? 0 0  Comment  have hearing aid  Vision? 0 0  Comment  have prescribe glasses  Difficulty concentrating or making decisions? 0 0  Walking or climbing stairs? 0 0  Dressing or bathing? 0 0  Doing errands, shopping? 0 0  Preparing Food and eating ? N N  Using the Toilet? N N  In the past six months, have you accidently leaked urine? CINDERELLA CINDERELLA  Comment wears depends somethimes  Do you have problems with loss of bowel control? N N  Managing your Medications? N N  Managing your Finances? N N  Housekeeping or managing your Housekeeping? N N    Patient Care Team: Norleen Lynwood ORN, MD as PCP - Diedre Okey Vina LULLA, MD as PCP - Cardiology (Cardiology) Abran Norleen SAILOR, MD as Consulting Physician (Gastroenterology) Magdalen Pasco RAMAN, DPM as Consulting Physician (Podiatry) Claudene Arthea HERO, DO as Consulting Physician (Family Medicine) [place], [place] HERO Raddle., MD as Attending Physician (Urology) Burnard Debby LABOR, MD (Inactive) as Consulting Physician (Cardiology) Regenia Prentice Clack, MD as Consulting Physician  (Ophthalmology) Regenia, Prentice Clack, MD as Consulting Physician (Ophthalmology)  I have updated your Care Teams any recent Medical Services you may have received from other providers in the past year.     Assessment:   This is a routine wellness examination for [place].  Hearing/Vision screen Vision Screening - Comments:: Wears rx glasses - up to date with routine eye exams with Dr Regenia   Goals Addressed               This Visit's Progress     Patient Stated (pt-stated)        Patient stated he plans to stay active        Depression Screen     10/04/2023   11:40 AM 04/25/2023    9:53 AM 04/25/2023    9:52 AM 11/12/2022    9:19 AM 04/24/2022    9:12 AM 11/03/2021   10:58 AM 10/04/2021   11:20 AM  PHQ 2/9 Scores  PHQ - 2 Score 0 0 0 0 0  0  PHQ- 9 Score 0 0   0  0  Exception Documentation      Patient refusal     Fall Risk     10/04/2023   11:40 AM 04/25/2023   10:01 AM 11/12/2022    9:24 AM 04/24/2022    9:12 AM 11/03/2021   10:58 AM  Fall Risk   Falls in the past year? 0 0 0 0 0  Number falls in past yr: 0 0 0 0   Injury with Fall? 0 0 0 0 0  Risk for fall due to : No Fall Risks No Fall Risks No Fall Risks No Fall Risks No Fall Risks  Follow up Falls evaluation completed;Falls prevention discussed Falls evaluation completed Falls evaluation completed Falls evaluation completed Falls evaluation completed      Data saved with a previous flowsheet row definition    MEDICARE RISK AT HOME:  Medicare Risk at Home Any stairs in or around the home?: Yes If so, are there any without handrails?: No Home free of  loose throw rugs in walkways, pet beds, electrical cords, etc?: Yes Adequate lighting in your home to reduce risk of falls?: Yes Life alert?: No Use of a cane, walker or w/c?: No Grab bars in the bathroom?: Yes Shower chair or bench in shower?: Yes Elevated toilet seat or a handicapped toilet?: No  TIMED UP AND GO:  Was the test performed?  No  Cognitive  Function: 6CIT completed    11/16/2016   10:31 AM  MMSE - Mini Mental State Exam  Orientation to time 5   Orientation to Place 5   Registration 3   Attention/ Calculation 4   Recall 0   Language- name 2 objects 2   Language- repeat 1  Language- follow 3 step command 3   Language- read & follow direction 1   Write a sentence 1   Copy design 1   Total score 26      Data saved with a previous flowsheet row definition        10/04/2023   11:42 AM 11/12/2022    9:25 AM  6CIT Screen  What Year? 0 points 0 points  What month? 0 points 0 points  What time? 0 points 0 points  Count back from 20 0 points 0 points  Months in reverse 0 points 2 points  Repeat phrase 2 points 2 points  Total Score 2 points 4 points    Immunizations Immunization History  Administered Date(s) Administered    sv, Bivalent, Protein Subunit Rsvpref,pf (Abrysvo) 11/13/2022   Fluad Quad(high Dose 65+) 10/30/2018, 11/15/2020, 11/03/2021   Influenza Whole 11/28/2009, 11/06/2010, 11/10/2011   Influenza, High Dose Seasonal PF 11/27/2014, 12/31/2014, 10/17/2016, 10/26/2017   Influenza, Seasonal, Injecte, Preservative Fre 10/23/2022   Influenza,inj,Quad PF,6+ Mos 11/11/2012, 12/22/2015, 12/18/2019   Influenza-Unspecified 12/04/2002, 11/27/2003, 12/28/2003, 10/23/2010, 12/28/2011, 10/27/2013, 11/30/2014, 10/28/2018, 10/28/2022   Moderna Covid-19 Vaccine Bivalent Booster 63yrs & up 11/24/2020   Moderna Sars-Covid-2 Vaccination 04/11/2019, 05/09/2019, 01/04/2020, 06/27/2020   Pfizer(Comirnaty )Fall Seasonal Vaccine 12 years and older 11/09/2022   Pneumococcal Conjugate-13 11/11/2012, 06/15/2014   Pneumococcal Polysaccharide-23 02/28/2004, 11/13/2012   Pneumococcal-Unspecified 01/05/2004, 12/28/2010   Td 02/27/2000   Td (Adult),unspecified 05/09/2012   Tdap 06/15/2014   Tetanus 05/09/2012   Zoster Recombinant(Shingrix) 01/29/2018, 05/07/2018   Zoster, Live 05/19/2013, 01/29/2018    Screening Tests Health  Maintenance  Topic Date Due   INFLUENZA VACCINE  09/27/2023   DTaP/Tdap/Td (5 - Td or Tdap) 06/14/2024   Medicare Annual Wellness (AWV)  10/03/2024   Pneumococcal Vaccine: 50+ Years  Completed   Zoster Vaccines- Shingrix  Completed   Hepatitis B Vaccines  Aged Out   HPV VACCINES  Aged Out   Meningococcal B Vaccine  Aged Out   Colonoscopy  Discontinued   COVID-19 Vaccine  Discontinued    Health Maintenance  Health Maintenance Due  Topic Date Due   INFLUENZA VACCINE  09/27/2023   Health Maintenance Items Addressed:   Additional Screening:  Vision Screening: Recommended annual ophthalmology exams for early detection of glaucoma and other disorders of the eye. Would you like a referral to an eye doctor? No    Dental Screening: Recommended annual dental exams for proper oral hygiene  Community Resource Referral / Chronic Care Management: CRR required this visit?  No   CCM required this visit?  No   Plan:    I have personally reviewed and noted the following in the patient's chart:   Medical and social history Use of alcohol, tobacco or illicit drugs  Current  medications and supplements including opioid prescriptions. Patient is not currently taking opioid prescriptions. Functional ability and status Nutritional status Physical activity Advanced directives List of other physicians Hospitalizations, surgeries, and ER visits in previous 12 months Vitals Screenings to include cognitive, depression, and falls Referrals and appointments  In addition, I have reviewed and discussed with patient certain preventive protocols, quality metrics, and best practice recommendations. A written personalized care plan for preventive services as well as general preventive health recommendations were provided to patient.   Verdie CHRISTELLA Saba, CMA   10/04/2023   After Visit Summary: (MyChart) Due to this being a telephonic visit, the after visit summary with patients personalized plan was  offered to patient via MyChart   Notes: Nothing significant to report at this time.

## 2023-10-04 NOTE — Patient Instructions (Signed)
 Kevin Hale , Thank you for taking time out of your busy schedule to complete your Annual Wellness Visit with me. I enjoyed our conversation and look forward to speaking with you again next year. I, as well as your care team,  appreciate your ongoing commitment to your health goals. Please review the following plan we discussed and let me know if I can assist you in the future. Your Game plan/ To Do List    Referrals: If you haven't heard from the office you've been referred to, please reach out to them at the phone provided.   Follow up Visits: We will see or speak with you next year for your Next Medicare AWV with our clinical staff Have you seen your provider in the last 6 months (3 months if uncontrolled diabetes)? No  Clinician Recommendations:  Aim for 30 minutes of exercise or brisk walking, 6-8 glasses of water, and 5 servings of fruits and vegetables each day.       This is a list of the screenings recommended for you:  Health Maintenance  Topic Date Due   Flu Shot  09/27/2023   DTaP/Tdap/Td vaccine (5 - Td or Tdap) 06/14/2024   Medicare Annual Wellness Visit  10/03/2024   Pneumococcal Vaccine for age over 39  Completed   Zoster (Shingles) Vaccine  Completed   Hepatitis B Vaccine  Aged Out   HPV Vaccine  Aged Out   Meningitis B Vaccine  Aged Out   Colon Cancer Screening  Discontinued   COVID-19 Vaccine  Discontinued    Advanced directives: (Declined) Advance directive discussed with you today. Even though you declined this today, please call our office should you change your mind, and we can give you the proper paperwork for you to fill out. Advance Care Planning is important because it:  [x]  Makes sure you receive the medical care that is consistent with your values, goals, and preferences  [x]  It provides guidance to your family and loved ones and reduces their decisional burden about whether or not they are making the right decisions based on your wishes.  Follow the link  provided in your after visit summary or read over the paperwork we have mailed to you to help you started getting your Advance Directives in place. If you need assistance in completing these, please reach out to us  so that we can help you!

## 2023-10-23 ENCOUNTER — Ambulatory Visit: Payer: Medicare Other | Admitting: Internal Medicine

## 2023-10-23 ENCOUNTER — Ambulatory Visit: Payer: Self-pay | Admitting: Internal Medicine

## 2023-10-23 ENCOUNTER — Encounter: Payer: Self-pay | Admitting: Internal Medicine

## 2023-10-23 VITALS — BP 106/62 | HR 74 | Temp 97.8°F | Ht 69.0 in | Wt 191.0 lb

## 2023-10-23 DIAGNOSIS — R350 Frequency of micturition: Secondary | ICD-10-CM | POA: Diagnosis not present

## 2023-10-23 DIAGNOSIS — E7800 Pure hypercholesterolemia, unspecified: Secondary | ICD-10-CM | POA: Diagnosis not present

## 2023-10-23 DIAGNOSIS — R7302 Impaired glucose tolerance (oral): Secondary | ICD-10-CM

## 2023-10-23 DIAGNOSIS — N3281 Overactive bladder: Secondary | ICD-10-CM

## 2023-10-23 DIAGNOSIS — J471 Bronchiectasis with (acute) exacerbation: Secondary | ICD-10-CM

## 2023-10-23 DIAGNOSIS — I10 Essential (primary) hypertension: Secondary | ICD-10-CM

## 2023-10-23 LAB — URINALYSIS, ROUTINE W REFLEX MICROSCOPIC
Hgb urine dipstick: NEGATIVE
Ketones, ur: NEGATIVE
Leukocytes,Ua: NEGATIVE
Nitrite: NEGATIVE
Specific Gravity, Urine: 1.02 (ref 1.000–1.030)
Total Protein, Urine: NEGATIVE
Urine Glucose: NEGATIVE
Urobilinogen, UA: 1 (ref 0.0–1.0)
pH: 6 (ref 5.0–8.0)

## 2023-10-23 LAB — HEPATIC FUNCTION PANEL
ALT: 18 U/L (ref 0–53)
AST: 19 U/L (ref 0–37)
Albumin: 4.2 g/dL (ref 3.5–5.2)
Alkaline Phosphatase: 78 U/L (ref 39–117)
Bilirubin, Direct: 0.1 mg/dL (ref 0.0–0.3)
Total Bilirubin: 0.7 mg/dL (ref 0.2–1.2)
Total Protein: 7.4 g/dL (ref 6.0–8.3)

## 2023-10-23 LAB — CBC WITH DIFFERENTIAL/PLATELET
Basophils Absolute: 0 K/uL (ref 0.0–0.1)
Basophils Relative: 0.7 % (ref 0.0–3.0)
Eosinophils Absolute: 0.2 K/uL (ref 0.0–0.7)
Eosinophils Relative: 3.1 % (ref 0.0–5.0)
HCT: 49.4 % (ref 39.0–52.0)
Hemoglobin: 15.8 g/dL (ref 13.0–17.0)
Lymphocytes Relative: 42.8 % (ref 12.0–46.0)
Lymphs Abs: 2.7 K/uL (ref 0.7–4.0)
MCHC: 31.9 g/dL (ref 30.0–36.0)
MCV: 87.9 fl (ref 78.0–100.0)
Monocytes Absolute: 0.5 K/uL (ref 0.1–1.0)
Monocytes Relative: 8.5 % (ref 3.0–12.0)
Neutro Abs: 2.8 K/uL (ref 1.4–7.7)
Neutrophils Relative %: 44.9 % (ref 43.0–77.0)
Platelets: 230 K/uL (ref 150.0–400.0)
RBC: 5.63 Mil/uL (ref 4.22–5.81)
RDW: 13.7 % (ref 11.5–15.5)
WBC: 6.3 K/uL (ref 4.0–10.5)

## 2023-10-23 LAB — BASIC METABOLIC PANEL WITH GFR
BUN: 18 mg/dL (ref 6–23)
CO2: 27 meq/L (ref 19–32)
Calcium: 9.3 mg/dL (ref 8.4–10.5)
Chloride: 105 meq/L (ref 96–112)
Creatinine, Ser: 1.14 mg/dL (ref 0.40–1.50)
GFR: 59.3 mL/min — ABNORMAL LOW (ref >60.00)
Glucose, Bld: 92 mg/dL (ref 70–99)
Potassium: 4.1 meq/L (ref 3.5–5.1)
Sodium: 138 meq/L (ref 135–145)

## 2023-10-23 LAB — LIPID PANEL
Cholesterol: 100 mg/dL (ref 0–200)
HDL: 45.6 mg/dL (ref >39.00)
LDL Cholesterol: 38 mg/dL (ref 0–99)
NonHDL: 54.85
Total CHOL/HDL Ratio: 2
Triglycerides: 86 mg/dL (ref 0.0–149.0)
VLDL: 17.2 mg/dL (ref 0.0–40.0)

## 2023-10-23 LAB — HEMOGLOBIN A1C: Hgb A1c MFr Bld: 5.6 % (ref 4.6–6.5)

## 2023-10-23 MED ORDER — AZITHROMYCIN 250 MG PO TABS: ORAL_TABLET | ORAL | 1 refills | Status: AC

## 2023-10-23 MED ORDER — SOLIFENACIN SUCCINATE 5 MG PO TABS: 5.0000 mg | ORAL_TABLET | Freq: Every day | ORAL | 3 refills | Status: DC

## 2023-10-23 MED ORDER — BENZONATATE 100 MG PO CAPS: 100.0000 mg | ORAL_CAPSULE | Freq: Three times a day (TID) | ORAL | 1 refills | Status: DC | PRN

## 2023-10-23 NOTE — Progress Notes (Signed)
 The test results show that your current treatment is OK, as the tests are stable.  Please continue the same plan.  There is no other need for change of treatment or further evaluation based on these results, at this time.  thanks

## 2023-10-23 NOTE — Progress Notes (Signed)
 Patient ID: LC JOYNT, male   DOB: March 02, 1940, 83 y.o.   MRN: [id number]        Chief Complaint: follow up urinary incontinence, urinary freq OAB, copd       HPI:  Kevin Hale is a 83 y.o. male Here with acute onset mild to mod 2-3 days ST, HA, general weakness and malaise, with prod cough greenish sputum, but Pt denies chest pain, increased sob or doe, wheezing, orthopnea, PND, increased LE swelling, palpitations, dizziness or syncope.  Also c/o ongoing urinary freq, but was unable to start medication he cannot recall due to cost.  Has been seeing urology for chronic incontinence and OAB.  Denies urinary symptoms such as urgency, flank pain, hematuria or n/v, fever, chills.    Wt Readings from Last 3 Encounters:  10/23/23 191 lb (86.6 kg)  10/04/23 195 lb (88.5 kg)  09/11/23 195 lb 12.3 oz (88.8 kg)   BP Readings from Last 3 Encounters:  10/23/23 106/62  09/11/23 138/67  08/28/23 (!) 154/75         Past Medical History:  Diagnosis Date   Anxiety    Atherosclerosis of abdominal aorta (HCC) 11/12/2011   BPH (benign prostatic hyperplasia)    Bronchiectasis (HCC) 03/26/2018   Cancer of prostate (HCC)    Chronic anticoagulation 09/12/2016   Coronary artery calcification seen on CT scan 03/26/2018   Diverticulosis of colon    DJD (degenerative joint disease)    left foot, pt denies   Glucose intolerance (impaired glucose tolerance) 11/03/2010   History of colon polyps    History of DVT (deep vein thrombosis)    History of pulmonary embolism    HTN (hypertension)    Hypercholesteremia    Lumbar degenerative disc disease 11/12/2011   Plantar fasciitis    Sleep apnea    Past Surgical History:  Procedure Laterality Date   INSERTION PROSTATE RADIATION SEED     UVULECTOMY      reports that he has quit smoking. His smoking use included cigarettes. He started smoking about 64 years ago. He has a 64.4 pack-year smoking history. He has never used smokeless tobacco. He reports  that he does not drink alcohol and does not use drugs. family history includes Deep vein thrombosis in his mother; Diabetes in his mother; Healthy in his brother and brother. Allergies  Allergen Reactions   Pravastatin  Other (See Comments)    myalgia   Current Outpatient Medications on File Prior to Visit  Medication Sig Dispense Refill   b complex vitamins tablet Take 1 tablet by mouth daily.     Cholecalciferol (VITAMIN D3) 1000 UNITS CAPS Take 1 Int'l Units by mouth daily.     losartan  (COZAAR ) 25 MG tablet Take 1 tablet (25 mg total) by mouth 2 (two) times daily. 180 tablet 3   simvastatin  (ZOCOR ) 20 MG tablet TAKE 1 TABLET BY MOUTH AT BEDTIME 90 tablet 3   XARELTO  20 MG TABS tablet TAKE 1 TABLET BY MOUTH ONCE DAILY WITH SUPPER 90 tablet 0   No current facility-administered medications on file prior to visit.        ROS:  All others reviewed and negative.  Objective        PE:  BP 106/62   Pulse 74   Temp 97.8 F (36.6 C)   Ht 5' 9 (1.753 m)   Wt 191 lb (86.6 kg)   SpO2 98%   BMI 28.21 kg/m  Constitutional: Pt appears mild ill               HENT: Head: NCAT.                Right Ear: External ear normal.                 Left Ear: External ear normal.                Eyes: . Pupils are equal, round, and reactive to light. Conjunctivae and EOM are normal               Nose: without d/c or deformity               Neck: Neck supple. Gross normal ROM               Cardiovascular: Normal rate and regular rhythm.                 Pulmonary/Chest: Effort normal and breath sounds without rales or wheezing.                Abd:  Soft, NT, ND, + BS, no organomegaly               Neurological: Pt is alert. At baseline orientation, motor grossly intact               Skin: Skin is warm. No rashes, no other new lesions, LE edema - none               Psychiatric: Pt behavior is normal without agitation   Micro: none  Cardiac tracings I have personally interpreted  today:  none  Pertinent Radiological findings (summarize): none   Lab Results  Component Value Date   WBC 6.3 10/23/2023   HGB 15.8 10/23/2023   HCT 49.4 10/23/2023   PLT 230.0 10/23/2023   GLUCOSE 92 10/23/2023   CHOL 100 10/23/2023   TRIG 86.0 10/23/2023   HDL 45.60 10/23/2023   LDLCALC 38 10/23/2023   ALT 18 10/23/2023   AST 19 10/23/2023   NA 138 10/23/2023   K 4.1 10/23/2023   CL 105 10/23/2023   CREATININE 1.14 10/23/2023   BUN 18 10/23/2023   CO2 27 10/23/2023   TSH 0.956 08/07/2023   PSA 0.00 (L) 10/04/2021   INR 2.1 05/28/2017   HGBA1C 5.6 10/23/2023   Assessment/Plan:  Kevin Hale is a 83 y.o. Black or African American [2] male with  has a past medical history of Anxiety, Atherosclerosis of abdominal aorta (HCC) (11/12/2011), BPH (benign prostatic hyperplasia), Bronchiectasis (HCC) (03/26/2018), Cancer of prostate (HCC), Chronic anticoagulation (09/12/2016), Coronary artery calcification seen on CT scan (03/26/2018), Diverticulosis of colon, DJD (degenerative joint disease), Glucose intolerance (impaired glucose tolerance) (11/03/2010), History of colon polyps, History of DVT (deep vein thrombosis), History of pulmonary embolism, HTN (hypertension), Hypercholesteremia, Lumbar degenerative disc disease (11/12/2011), Plantar fasciitis, and Sleep apnea.  HTN (hypertension) BP Readings from Last 3 Encounters:  10/23/23 106/62  09/11/23 138/67  08/28/23 (!) 154/75   Stable, pt to continue medical treatment losartan  25 mg qd   Hypercholesteremia Lab Results  Component Value Date   LDLCALC 38 10/23/2023   Stable, pt to continue current statin zocor  20 mg qd   Impaired glucose tolerance Lab Results  Component Value Date   HGBA1C 5.6 10/23/2023   Stable, pt to continue current medical treatment - diet, wt control   OAB (overactive bladder) Ok for vesicare  5 mg  qd  Bronchiectasis (HCC) With likely mild flare - for zpack asd, tessalon  perle prn, declines  cxr today  Followup: Return in about 6 months (around 04/24/2024).  Lynwood Rush, MD 10/27/2023 4:50 PM [facility] Internal Medicine

## 2023-10-23 NOTE — Patient Instructions (Signed)
 Please take all new medication as prescribed  - the Vesicare  5 mg for the bladder   Please take all new medication as prescribed - the antibiotic, and cough pills as needed  Please call if the area to the left upper arm does not improve, for referral to dermatology  Please continue all other medications as before, and refills have been done if requested.  Please have the pharmacy call with any other refills you may need..  Please keep your appointments with your specialists as you may have planned  Please go to the LAB at the blood drawing area for the tests to be done  You will be contacted by phone if any changes need to be made immediately.  Otherwise, you will receive a letter about your results with an explanation, but please check with MyChart first.  Please make an Appointment to return in 6 months, or sooner if needed

## 2023-10-24 LAB — URINE CULTURE: Result:: NO GROWTH

## 2023-10-27 ENCOUNTER — Encounter: Payer: Self-pay | Admitting: Internal Medicine

## 2023-10-27 NOTE — Assessment & Plan Note (Signed)
 Ok for vesicare  5 mg qd

## 2023-10-27 NOTE — Assessment & Plan Note (Signed)
 With likely mild flare - for zpack asd, tessalon  perle prn, declines cxr today

## 2023-10-27 NOTE — Assessment & Plan Note (Signed)
 Lab Results  Component Value Date   HGBA1C 5.6 10/23/2023   Stable, pt to continue current medical treatment - diet, wt control

## 2023-10-27 NOTE — Assessment & Plan Note (Signed)
 BP Readings from Last 3 Encounters:  10/23/23 106/62  09/11/23 138/67  08/28/23 (!) 154/75   Stable, pt to continue medical treatment losartan  25 mg qd

## 2023-10-27 NOTE — Assessment & Plan Note (Signed)
 Lab Results  Component Value Date   LDLCALC 38 10/23/2023   Stable, pt to continue current statin zocor  20 mg qd

## 2023-12-05 ENCOUNTER — Other Ambulatory Visit (HOSPITAL_BASED_OUTPATIENT_CLINIC_OR_DEPARTMENT_OTHER): Payer: Self-pay

## 2023-12-05 MED ORDER — FLUZONE HIGH-DOSE 0.5 ML IM SUSY
0.5000 mL | PREFILLED_SYRINGE | Freq: Once | INTRAMUSCULAR | 0 refills | Status: AC
  Filled 2023-12-05: qty 0.5, 1d supply, fill #0

## 2023-12-31 ENCOUNTER — Other Ambulatory Visit: Payer: Self-pay

## 2023-12-31 ENCOUNTER — Other Ambulatory Visit: Payer: Self-pay | Admitting: Internal Medicine

## 2024-01-10 ENCOUNTER — Ambulatory Visit (INDEPENDENT_AMBULATORY_CARE_PROVIDER_SITE_OTHER): Admitting: Student in an Organized Health Care Education/Training Program

## 2024-01-10 ENCOUNTER — Encounter: Payer: Self-pay | Admitting: Student in an Organized Health Care Education/Training Program

## 2024-01-10 VITALS — BP 141/69 | HR 86 | Wt 188.0 lb

## 2024-01-10 DIAGNOSIS — D6859 Other primary thrombophilia: Secondary | ICD-10-CM

## 2024-01-10 DIAGNOSIS — I10 Essential (primary) hypertension: Secondary | ICD-10-CM | POA: Diagnosis not present

## 2024-01-10 DIAGNOSIS — N1831 Chronic kidney disease, stage 3a: Secondary | ICD-10-CM | POA: Diagnosis not present

## 2024-01-10 DIAGNOSIS — R399 Unspecified symptoms and signs involving the genitourinary system: Secondary | ICD-10-CM | POA: Diagnosis not present

## 2024-01-10 MED ORDER — TAMSULOSIN HCL 0.4 MG PO CAPS: 0.4000 mg | ORAL_CAPSULE | Freq: Every day | ORAL | 1 refills | Status: AC

## 2024-01-10 NOTE — Assessment & Plan Note (Signed)
 Chronic and stable.  Last blood work in August showed a estimated GFR of 59 based on the creatinine.  Will continue with losartan .  Recheck renal function about every 6 months, will get a Cystatin C with next check.  Will also check a urine microalbumin next time.

## 2024-01-10 NOTE — Progress Notes (Signed)
 Established Patient Office Visit  Patient ID: Kevin Hale, male    DOB: 04-01-1940  Age: 83 y.o. MRN: [id number] PCP: Jerrell Cleatus Ned, MD  Chief Complaint  Patient presents with   Transitions Of Care    Subjective:     HPI  Discussed the use of AI scribe software for clinical note transcription with the patient, who gave verbal consent to proceed.  History of Present Illness Kevin Hale is an 83 year old male with a history of prostate cancer who presents for management of urinary incontinence and medication review.  He has been experiencing urinary incontinence for about a year, characterized by urgency and occasional leakage without awareness. He uses protective pads and describes the leakage as 'damp' rather than 'real wet.' He experiences nocturia, getting up two to three times a night to urinate, and notes a weak urinary stream without difficulty initiating urination. He was previously prescribed Flomax by his urologist around the time of his prostate cancer treatment and catheter placement, but he does not currently use it.  He has a history of prostate cancer treated with seed implants approximately 20-25 years ago while living in [place] . He has not had any recent surgeries or hospital stays.  He has been on Xarelto  for about 20 years following a spontaneous blood clot in his leg, which remains sore but does not affect his daily activities. No issues with bleeding, bruising, or blood in his stool or urine.  He takes simvastatin  for cholesterol and losartan  25 mg twice daily for blood pressure. No history of heart attack or stroke.  He is active, managing household and yard work, and drives and shops independently. He lives with his wife and has two grown children and four grandchildren, one of whom lives nearby.    Objective:     BP (!) 141/69   Pulse 86   Wt 188 lb (85.3 kg)   SpO2 98%   BMI 27.76 kg/m   Physical Exam  Gen: Well-appearing older man,  mild frailty Neck: Normal thyroid , no nodules or adenopathy Ears: Moderate amount of nonobstructing wax in the right ear canal and left Heart: Regular, no murmur Lungs: Unlabored, clear throughout Abd: Soft and nontender Ext: Warm, no edema  POCUS: Bladder is nondistended, prostate is mildly enlarged with measurements of 4 x 3.8 x 4 cm.  Mild amount of protrusion of the prostate into the bladder.    Assessment & Plan:   Problem List Items Addressed This Visit       High   Hypercoagulable state (Chronic)   Chronic and stable.  Patient has a history of an unprovoked DVT and pulmonary embolism about 20 years ago.  Doing well on Xarelto  20 mg once daily with no high risk bleeding.  Will continue this medication indefinitely.      HTN (hypertension) (Chronic)   Chronic and stable.  Blood pressure little elevated today at 141/69.  Currently using losartan  25 mg twice daily.  He is okay to continue this.  He has mild to moderate frailty, so I think a higher blood pressure goal is reasonable.      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  Last blood work in August showed a estimated GFR of 59 based on the creatinine.  Will continue with losartan .  Recheck renal function about every 6 months, will get a Cystatin C with next check.  Will also check a urine microalbumin next time.  Medium    Lower urinary tract symptoms (LUTS) - Primary (Chronic)   Urinary incontinence and bladder outlet obstruction are likely due to prostatic hyperplasia, with prostate enlargement confirmed by ultrasound. Previous medication with Vesicare  was ineffective.  I recommended switching from treating this as overactive bladder to instead trying to treat the prostate issue, and hopefully a more decompressed bladder after administration would resolve his leakage and other lower urinary tract symptoms.  Discussed the benefits and risks of tamsulosin and emphasized the importance of timed  voiding. Prescribed tamsulosin (Flomax) once daily and advised timed voiding every 2-3 hours. He should monitor for worsening leakage or lightheadedness and discontinue if symptoms are severe.      Relevant Medications   tamsulosin (FLOMAX) 0.4 MG CAPS capsule   Return in about 3 months (around 04/11/2024) for LUTS follow up.    Cleatus Debby Specking, MD [facility]

## 2024-01-10 NOTE — Patient Instructions (Signed)
  VISIT SUMMARY: Today, you were seen for the management of urinary incontinence and a review of your medications. We discussed your symptoms, including urgency, occasional leakage, and nocturia. We also reviewed your history of prostate cancer, venous thrombosis, hyperlipidemia, and hypertension. Your current medications and their effectiveness were evaluated.  YOUR PLAN: -URINARY INCONTINENCE AND BLADDER OUTLET OBSTRUCTION DUE TO BENIGN PROSTATIC HYPERPLASIA: This condition is caused by an enlarged prostate, which can block the flow of urine. We discussed starting tamsulosin (Flomax) once daily to help relieve your symptoms. Additionally, you should practice timed voiding every 2-3 hours. Please monitor for any worsening leakage or lightheadedness and stop the medication if these symptoms become severe.  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 There are no recent issues with bleeding or bruising, so you should continue taking Xarelto  as prescribed.  -HYPERLIPIDEMIA: This condition means you have high cholesterol levels. Your cholesterol is well-managed with simvastatin , so you should continue taking it as prescribed.  -HYPERTENSION: This condition means you have high blood pressure. Your blood pressure is well-controlled with losartan , so you should continue taking 25 mg twice daily as prescribed.  INSTRUCTIONS: Please follow up if you experience any severe side effects from the new medication or if your symptoms worsen. Continue with your current medications as prescribed and practice timed voiding every 2-3 hours. If you have any concerns or new symptoms, do not hesitate to contact our office.

## 2024-01-10 NOTE — Assessment & Plan Note (Signed)
 Chronic and stable.  Blood pressure little elevated today at 141/69.  Currently using losartan  25 mg twice daily.  He is okay to continue this.  He has mild to moderate frailty, so I think a higher blood pressure goal is reasonable.

## 2024-01-10 NOTE — Assessment & Plan Note (Signed)
 Chronic and stable.  Patient has a history of an unprovoked DVT and pulmonary embolism about 20 years ago.  Doing well on Xarelto  20 mg once daily with no high risk bleeding.  Will continue this medication indefinitely.

## 2024-01-10 NOTE — Assessment & Plan Note (Signed)
 Urinary incontinence and bladder outlet obstruction are likely due to prostatic hyperplasia, with prostate enlargement confirmed by ultrasound. Previous medication with Vesicare  was ineffective.  I recommended switching from treating this as overactive bladder to instead trying to treat the prostate issue, and hopefully a more decompressed bladder after administration would resolve his leakage and other lower urinary tract symptoms.  Discussed the benefits and risks of tamsulosin and emphasized the importance of timed voiding. Prescribed tamsulosin (Flomax) once daily and advised timed voiding every 2-3 hours. He should monitor for worsening leakage or lightheadedness and discontinue if symptoms are severe.

## 2024-01-15 ENCOUNTER — Other Ambulatory Visit (HOSPITAL_BASED_OUTPATIENT_CLINIC_OR_DEPARTMENT_OTHER): Payer: Self-pay

## 2024-01-15 MED ORDER — COMIRNATY 30 MCG/0.3ML IM SUSY
0.3000 mL | PREFILLED_SYRINGE | Freq: Once | INTRAMUSCULAR | 0 refills | Status: AC
  Filled 2024-01-15: qty 0.3, 1d supply, fill #0

## 2024-03-28 ENCOUNTER — Other Ambulatory Visit: Payer: Self-pay | Admitting: Internal Medicine

## 2024-04-13 ENCOUNTER — Ambulatory Visit: Admitting: Student in an Organized Health Care Education/Training Program

## 2024-04-24 ENCOUNTER — Ambulatory Visit: Admitting: Internal Medicine

## 2024-10-06 ENCOUNTER — Ambulatory Visit
